# Patient Record
Sex: Male | Born: 1940 | Race: White | Hispanic: No
Health system: Midwestern US, Community
[De-identification: ages and names within clinical notes are randomized; demographics above are authoritative.]

## PROBLEM LIST (undated history)

## (undated) DIAGNOSIS — K811 Chronic cholecystitis: Secondary | ICD-10-CM

## (undated) DIAGNOSIS — E669 Obesity, unspecified: Secondary | ICD-10-CM

## (undated) DIAGNOSIS — R002 Palpitations: Secondary | ICD-10-CM

## (undated) DIAGNOSIS — I10 Essential (primary) hypertension: Secondary | ICD-10-CM

## (undated) DIAGNOSIS — R55 Syncope and collapse: Secondary | ICD-10-CM

## (undated) DIAGNOSIS — E785 Hyperlipidemia, unspecified: Secondary | ICD-10-CM

## (undated) DIAGNOSIS — H35371 Puckering of macula, right eye: Secondary | ICD-10-CM

## (undated) HISTORY — PX: CATARACT EXTRACTION: SUR2

## (undated) HISTORY — PX: TONSILLECTOMY: SUR1361

## (undated) HISTORY — DX: Palpitations: R00.2

## (undated) HISTORY — PX: LITHOTRIPSY: SUR834

## (undated) HISTORY — DX: Essential (primary) hypertension: I10

## (undated) HISTORY — DX: Hyperlipidemia, unspecified: E78.5

## (undated) HISTORY — DX: Syncope and collapse: R55

## (undated) HISTORY — DX: Obesity, unspecified: E66.9

---

## 2000-04-16 ENCOUNTER — Ambulatory Visit (HOSPITAL_COMMUNITY): Admission: RE | Admit: 2000-04-16 | Discharge: 2000-04-16 | Payer: Self-pay | Admitting: Urology

## 2000-04-16 ENCOUNTER — Encounter: Payer: Self-pay | Admitting: Urology

## 2001-05-13 ENCOUNTER — Encounter (INDEPENDENT_AMBULATORY_CARE_PROVIDER_SITE_OTHER): Payer: Self-pay | Admitting: Specialist

## 2001-05-13 ENCOUNTER — Ambulatory Visit (HOSPITAL_COMMUNITY): Admission: RE | Admit: 2001-05-13 | Discharge: 2001-05-13 | Payer: Self-pay | Admitting: Gastroenterology

## 2004-09-15 ENCOUNTER — Ambulatory Visit (HOSPITAL_COMMUNITY): Admission: RE | Admit: 2004-09-15 | Discharge: 2004-09-15 | Payer: Self-pay | Admitting: Gastroenterology

## 2008-09-29 ENCOUNTER — Encounter: Admission: RE | Admit: 2008-09-29 | Discharge: 2008-09-29 | Payer: Self-pay | Admitting: Family Medicine

## 2010-05-10 ENCOUNTER — Other Ambulatory Visit: Payer: Self-pay | Admitting: Dermatology

## 2010-07-06 ENCOUNTER — Other Ambulatory Visit: Payer: Self-pay | Admitting: Dermatology

## 2010-08-12 NOTE — Op Note (Signed)
Dylan Zimmerman, Dylan Zimmerman               ACCOUNT NO.:  [id number]   MEDICAL RECORD NO.:  [id number]          PATIENT TYPE:  AMB   LOCATION:  ENDO                         FACILITY:  [facility]   PHYSICIAN:  John C. Madilyn Fireman, M.D.    DATE OF BIRTH:  Aug 13, 1940   DATE OF PROCEDURE:  09/15/2004  DATE OF DISCHARGE:                                 OPERATIVE REPORT   PROCEDURE:  Colonoscopy.   INDICATIONS FOR PROCEDURE:  Family history of colon cancer in a first-degree  relative as well as a history of adenomatous colon polyps on colonoscopy  three years ago.   DESCRIPTION OF PROCEDURE:  The patient was placed in the left lateral  decubitus position then placed on the pulse monitor with continuous low-flow  oxygen delivered by nasal cannula. He was sedated with 50 mcg IV fentanyl  and 6 mg IV Versed. The Olympus video colonoscope was inserted into the  rectum and advanced to the cecum, confirmed by transillumination at  McBurney's point and visualization of the ileocecal valve and appendiceal  orifice. The prep was good. The cecum, ascending, transverse, descending and  sigmoid colon all appeared normal with no  masses, polyps, diverticula or  other mucosal abnormalities. The rectum likewise appeared normal and  retroflexed view of the anus revealed no obvious internal hemorrhoids. The  scope was then withdrawn and the patient returned to the recovery room in  stable condition. He tolerated the procedure well and there were no  immediate complications.   IMPRESSION:  Normal colonoscopy.   PLAN:  Repeat study in five years.       JCH/MEDQ  D:  09/15/2004  T:  09/15/2004  Job:  [id number]   cc:   Jethro Bastos, M.D.  [address]  [address]  [place]  [place] [address]  Fax: [phone]

## 2010-08-12 NOTE — Procedures (Signed)
[facility]  Patient:    Dylan Zimmerman Visit Number: [id number] MRN: [id number]          Service Type: END Location: ENDO Attending Physician:  Louie Bun Proc. Date: 05/13/01 Admit Date:  05/13/2001   CC:         Jethro Bastos, M.D.   Procedure Report  PROCEDURE PERFORMED:  Colonoscopy with polypectomy.  ENDOSCOPIST:  Everardo All. Madilyn Fireman, M.D.  INDICATION:  Family history of colon cancer in first degree relative.  Also impression of palpable rectal nodule on previous colonoscopy.  DESCRIPTION OF PROCEDURE:  The patient was placed in the left lateral decubitus position and placed on the pulse monitor with continuous low flow oxygen delivered by nasal cannula.  He was sedated with 70 mg IV Demerol and 6 mg IV Versed.  Once the video colonoscope was inserted into the rectum, it was advanced to the cecum, confirmed by transillumination of McBurneys point, and visualization of the ileocecal valve and appendiceal orifice.  The prep was excellent.  The cecum, ascending, transverse, descending, and sigmoid colon all appeared normal.  No masses, polyps, diverticula, or other mucosal abnormalities.  Within the rectum, there was a raised elevated area of mucosa extending from near the anal verge to approximately 3 cm proximal, and there was some erythema of the overlying mucosa, and a small area of what appeared to be purulence or exudate, although when pressed with the scope, no exudate could be extruded from the elevated area.  It was not clear, but this could have possibly represented an enlarged hemorrhoid or possibly even a small perianal abscess.  Immediately next to this raised area of mucosa, there was a small 5 mm polyp, which was fulgurated by hot biopsy.  The scope was then withdrawn.  External hemorrhoids were noted on withdrawal.  The patient was returned to the recovery room in stable condition.  He tolerated the procedure well and  there were no immediate complications.  IMPRESSION: 1. Small rectal polyp. 2. Small rectal nodule possibly representing small perianal    abscess or inflamed hemorrhoid.  PLAN:  Will review symptoms and consider trial Proctoform-HC and if she remains symptoms, refer to general surgeon for further evaluation.  Also, will await histology of the polyp for determination of need for future surveillance colonoscopy. Attending Physician:  Louie Bun DD:  05/13/01 TD:  05/13/01 Job: [id number]

## 2011-07-05 DIAGNOSIS — H2510 Age-related nuclear cataract, unspecified eye: Secondary | ICD-10-CM | POA: Diagnosis not present

## 2011-07-05 DIAGNOSIS — H40059 Ocular hypertension, unspecified eye: Secondary | ICD-10-CM | POA: Diagnosis not present

## 2011-07-05 DIAGNOSIS — H3581 Retinal edema: Secondary | ICD-10-CM | POA: Diagnosis not present

## 2011-07-05 DIAGNOSIS — H25019 Cortical age-related cataract, unspecified eye: Secondary | ICD-10-CM | POA: Diagnosis not present

## 2011-08-07 DIAGNOSIS — H2510 Age-related nuclear cataract, unspecified eye: Secondary | ICD-10-CM | POA: Diagnosis not present

## 2011-08-07 DIAGNOSIS — H35379 Puckering of macula, unspecified eye: Secondary | ICD-10-CM | POA: Diagnosis not present

## 2011-08-14 ENCOUNTER — Other Ambulatory Visit: Payer: Self-pay | Admitting: Dermatology

## 2011-08-14 DIAGNOSIS — L570 Actinic keratosis: Secondary | ICD-10-CM | POA: Diagnosis not present

## 2011-08-14 DIAGNOSIS — L821 Other seborrheic keratosis: Secondary | ICD-10-CM | POA: Diagnosis not present

## 2011-08-14 DIAGNOSIS — D485 Neoplasm of uncertain behavior of skin: Secondary | ICD-10-CM | POA: Diagnosis not present

## 2011-08-14 DIAGNOSIS — L578 Other skin changes due to chronic exposure to nonionizing radiation: Secondary | ICD-10-CM | POA: Diagnosis not present

## 2011-09-13 DIAGNOSIS — H2510 Age-related nuclear cataract, unspecified eye: Secondary | ICD-10-CM | POA: Diagnosis not present

## 2011-09-25 DIAGNOSIS — H269 Unspecified cataract: Secondary | ICD-10-CM | POA: Diagnosis not present

## 2011-09-25 DIAGNOSIS — H35379 Puckering of macula, unspecified eye: Secondary | ICD-10-CM | POA: Diagnosis not present

## 2011-09-25 DIAGNOSIS — H2510 Age-related nuclear cataract, unspecified eye: Secondary | ICD-10-CM | POA: Diagnosis not present

## 2012-01-09 DIAGNOSIS — Z23 Encounter for immunization: Secondary | ICD-10-CM | POA: Diagnosis not present

## 2012-01-15 ENCOUNTER — Other Ambulatory Visit: Payer: Self-pay | Admitting: Dermatology

## 2012-01-15 DIAGNOSIS — D1801 Hemangioma of skin and subcutaneous tissue: Secondary | ICD-10-CM | POA: Diagnosis not present

## 2012-01-15 DIAGNOSIS — L578 Other skin changes due to chronic exposure to nonionizing radiation: Secondary | ICD-10-CM | POA: Diagnosis not present

## 2012-01-15 DIAGNOSIS — L819 Disorder of pigmentation, unspecified: Secondary | ICD-10-CM | POA: Diagnosis not present

## 2012-01-15 DIAGNOSIS — D485 Neoplasm of uncertain behavior of skin: Secondary | ICD-10-CM | POA: Diagnosis not present

## 2012-01-15 DIAGNOSIS — L570 Actinic keratosis: Secondary | ICD-10-CM | POA: Diagnosis not present

## 2012-01-15 DIAGNOSIS — L821 Other seborrheic keratosis: Secondary | ICD-10-CM | POA: Diagnosis not present

## 2012-01-15 DIAGNOSIS — D239 Other benign neoplasm of skin, unspecified: Secondary | ICD-10-CM | POA: Diagnosis not present

## 2012-01-22 DIAGNOSIS — Z0000 Encounter for general adult medical examination without abnormal findings: Secondary | ICD-10-CM | POA: Diagnosis not present

## 2012-01-22 DIAGNOSIS — Z125 Encounter for screening for malignant neoplasm of prostate: Secondary | ICD-10-CM | POA: Diagnosis not present

## 2012-01-22 DIAGNOSIS — E785 Hyperlipidemia, unspecified: Secondary | ICD-10-CM | POA: Diagnosis not present

## 2012-01-22 DIAGNOSIS — I10 Essential (primary) hypertension: Secondary | ICD-10-CM | POA: Diagnosis not present

## 2012-01-22 DIAGNOSIS — G4700 Insomnia, unspecified: Secondary | ICD-10-CM | POA: Diagnosis not present

## 2012-01-22 DIAGNOSIS — Z1331 Encounter for screening for depression: Secondary | ICD-10-CM | POA: Diagnosis not present

## 2012-02-14 DIAGNOSIS — H04129 Dry eye syndrome of unspecified lacrimal gland: Secondary | ICD-10-CM | POA: Diagnosis not present

## 2012-02-14 DIAGNOSIS — H3581 Retinal edema: Secondary | ICD-10-CM | POA: Diagnosis not present

## 2012-02-14 DIAGNOSIS — H40059 Ocular hypertension, unspecified eye: Secondary | ICD-10-CM | POA: Diagnosis not present

## 2012-02-14 DIAGNOSIS — H02409 Unspecified ptosis of unspecified eyelid: Secondary | ICD-10-CM | POA: Diagnosis not present

## 2012-03-05 DIAGNOSIS — I10 Essential (primary) hypertension: Secondary | ICD-10-CM | POA: Diagnosis not present

## 2012-04-15 DIAGNOSIS — D3701 Neoplasm of uncertain behavior of lip: Secondary | ICD-10-CM | POA: Diagnosis not present

## 2012-04-15 DIAGNOSIS — D3709 Neoplasm of uncertain behavior of other specified sites of the oral cavity: Secondary | ICD-10-CM | POA: Diagnosis not present

## 2012-07-24 DIAGNOSIS — Z85828 Personal history of other malignant neoplasm of skin: Secondary | ICD-10-CM | POA: Diagnosis not present

## 2012-07-24 DIAGNOSIS — L819 Disorder of pigmentation, unspecified: Secondary | ICD-10-CM | POA: Diagnosis not present

## 2012-07-24 DIAGNOSIS — L820 Inflamed seborrheic keratosis: Secondary | ICD-10-CM | POA: Diagnosis not present

## 2012-07-24 DIAGNOSIS — D239 Other benign neoplasm of skin, unspecified: Secondary | ICD-10-CM | POA: Diagnosis not present

## 2012-07-24 DIAGNOSIS — D1801 Hemangioma of skin and subcutaneous tissue: Secondary | ICD-10-CM | POA: Diagnosis not present

## 2012-07-24 DIAGNOSIS — L821 Other seborrheic keratosis: Secondary | ICD-10-CM | POA: Diagnosis not present

## 2012-08-05 DIAGNOSIS — I10 Essential (primary) hypertension: Secondary | ICD-10-CM | POA: Diagnosis not present

## 2012-08-05 DIAGNOSIS — E785 Hyperlipidemia, unspecified: Secondary | ICD-10-CM | POA: Diagnosis not present

## 2012-08-14 DIAGNOSIS — H40059 Ocular hypertension, unspecified eye: Secondary | ICD-10-CM | POA: Diagnosis not present

## 2012-08-14 DIAGNOSIS — H04129 Dry eye syndrome of unspecified lacrimal gland: Secondary | ICD-10-CM | POA: Diagnosis not present

## 2012-08-14 DIAGNOSIS — H2510 Age-related nuclear cataract, unspecified eye: Secondary | ICD-10-CM | POA: Diagnosis not present

## 2012-08-14 DIAGNOSIS — Z961 Presence of intraocular lens: Secondary | ICD-10-CM | POA: Diagnosis not present

## 2012-12-23 DIAGNOSIS — Z23 Encounter for immunization: Secondary | ICD-10-CM | POA: Diagnosis not present

## 2013-01-22 ENCOUNTER — Other Ambulatory Visit: Payer: Self-pay | Admitting: Dermatology

## 2013-01-22 DIAGNOSIS — C44319 Basal cell carcinoma of skin of other parts of face: Secondary | ICD-10-CM | POA: Diagnosis not present

## 2013-01-22 DIAGNOSIS — L570 Actinic keratosis: Secondary | ICD-10-CM | POA: Diagnosis not present

## 2013-01-22 DIAGNOSIS — D239 Other benign neoplasm of skin, unspecified: Secondary | ICD-10-CM | POA: Diagnosis not present

## 2013-01-22 DIAGNOSIS — Z85828 Personal history of other malignant neoplasm of skin: Secondary | ICD-10-CM | POA: Diagnosis not present

## 2013-01-22 DIAGNOSIS — L820 Inflamed seborrheic keratosis: Secondary | ICD-10-CM | POA: Diagnosis not present

## 2013-01-22 DIAGNOSIS — L821 Other seborrheic keratosis: Secondary | ICD-10-CM | POA: Diagnosis not present

## 2013-02-05 DIAGNOSIS — G4700 Insomnia, unspecified: Secondary | ICD-10-CM | POA: Diagnosis not present

## 2013-02-05 DIAGNOSIS — E785 Hyperlipidemia, unspecified: Secondary | ICD-10-CM | POA: Diagnosis not present

## 2013-02-05 DIAGNOSIS — Z0000 Encounter for general adult medical examination without abnormal findings: Secondary | ICD-10-CM | POA: Diagnosis not present

## 2013-02-05 DIAGNOSIS — Z125 Encounter for screening for malignant neoplasm of prostate: Secondary | ICD-10-CM | POA: Diagnosis not present

## 2013-02-05 DIAGNOSIS — I10 Essential (primary) hypertension: Secondary | ICD-10-CM | POA: Diagnosis not present

## 2013-02-05 DIAGNOSIS — B351 Tinea unguium: Secondary | ICD-10-CM | POA: Diagnosis not present

## 2013-02-05 DIAGNOSIS — K649 Unspecified hemorrhoids: Secondary | ICD-10-CM | POA: Diagnosis not present

## 2013-02-12 DIAGNOSIS — H35379 Puckering of macula, unspecified eye: Secondary | ICD-10-CM | POA: Diagnosis not present

## 2013-02-12 DIAGNOSIS — H04129 Dry eye syndrome of unspecified lacrimal gland: Secondary | ICD-10-CM | POA: Diagnosis not present

## 2013-02-12 DIAGNOSIS — Z961 Presence of intraocular lens: Secondary | ICD-10-CM | POA: Diagnosis not present

## 2013-02-12 DIAGNOSIS — H02409 Unspecified ptosis of unspecified eyelid: Secondary | ICD-10-CM | POA: Diagnosis not present

## 2013-02-12 DIAGNOSIS — H2589 Other age-related cataract: Secondary | ICD-10-CM | POA: Diagnosis not present

## 2013-02-12 DIAGNOSIS — H40059 Ocular hypertension, unspecified eye: Secondary | ICD-10-CM | POA: Diagnosis not present

## 2013-02-26 DIAGNOSIS — B351 Tinea unguium: Secondary | ICD-10-CM | POA: Diagnosis not present

## 2013-04-09 DIAGNOSIS — I10 Essential (primary) hypertension: Secondary | ICD-10-CM | POA: Diagnosis not present

## 2013-04-09 DIAGNOSIS — B351 Tinea unguium: Secondary | ICD-10-CM | POA: Diagnosis not present

## 2013-04-09 DIAGNOSIS — K219 Gastro-esophageal reflux disease without esophagitis: Secondary | ICD-10-CM | POA: Diagnosis not present

## 2013-05-05 DIAGNOSIS — L608 Other nail disorders: Secondary | ICD-10-CM | POA: Diagnosis not present

## 2013-05-05 DIAGNOSIS — D2370 Other benign neoplasm of skin of unspecified lower limb, including hip: Secondary | ICD-10-CM | POA: Diagnosis not present

## 2013-05-26 DIAGNOSIS — D2370 Other benign neoplasm of skin of unspecified lower limb, including hip: Secondary | ICD-10-CM | POA: Diagnosis not present

## 2013-07-23 ENCOUNTER — Other Ambulatory Visit: Payer: Self-pay | Admitting: Dermatology

## 2013-07-23 DIAGNOSIS — C44319 Basal cell carcinoma of skin of other parts of face: Secondary | ICD-10-CM | POA: Diagnosis not present

## 2013-07-23 DIAGNOSIS — D2370 Other benign neoplasm of skin of unspecified lower limb, including hip: Secondary | ICD-10-CM | POA: Diagnosis not present

## 2013-07-23 DIAGNOSIS — D239 Other benign neoplasm of skin, unspecified: Secondary | ICD-10-CM | POA: Diagnosis not present

## 2013-07-23 DIAGNOSIS — Z85828 Personal history of other malignant neoplasm of skin: Secondary | ICD-10-CM | POA: Diagnosis not present

## 2013-07-23 DIAGNOSIS — L570 Actinic keratosis: Secondary | ICD-10-CM | POA: Diagnosis not present

## 2013-07-23 DIAGNOSIS — D1801 Hemangioma of skin and subcutaneous tissue: Secondary | ICD-10-CM | POA: Diagnosis not present

## 2013-07-23 DIAGNOSIS — L821 Other seborrheic keratosis: Secondary | ICD-10-CM | POA: Diagnosis not present

## 2013-08-13 DIAGNOSIS — Z961 Presence of intraocular lens: Secondary | ICD-10-CM | POA: Diagnosis not present

## 2013-08-13 DIAGNOSIS — H35379 Puckering of macula, unspecified eye: Secondary | ICD-10-CM | POA: Diagnosis not present

## 2013-08-13 DIAGNOSIS — H2589 Other age-related cataract: Secondary | ICD-10-CM | POA: Diagnosis not present

## 2013-08-13 DIAGNOSIS — H40059 Ocular hypertension, unspecified eye: Secondary | ICD-10-CM | POA: Diagnosis not present

## 2013-08-13 DIAGNOSIS — H04129 Dry eye syndrome of unspecified lacrimal gland: Secondary | ICD-10-CM | POA: Diagnosis not present

## 2013-09-17 DIAGNOSIS — Z85828 Personal history of other malignant neoplasm of skin: Secondary | ICD-10-CM | POA: Diagnosis not present

## 2013-09-17 DIAGNOSIS — C44319 Basal cell carcinoma of skin of other parts of face: Secondary | ICD-10-CM | POA: Diagnosis not present

## 2013-12-01 DIAGNOSIS — Z23 Encounter for immunization: Secondary | ICD-10-CM | POA: Diagnosis not present

## 2014-02-02 DIAGNOSIS — L821 Other seborrheic keratosis: Secondary | ICD-10-CM | POA: Diagnosis not present

## 2014-02-02 DIAGNOSIS — L905 Scar conditions and fibrosis of skin: Secondary | ICD-10-CM | POA: Diagnosis not present

## 2014-02-02 DIAGNOSIS — Z85828 Personal history of other malignant neoplasm of skin: Secondary | ICD-10-CM | POA: Diagnosis not present

## 2014-02-02 DIAGNOSIS — D2372 Other benign neoplasm of skin of left lower limb, including hip: Secondary | ICD-10-CM | POA: Diagnosis not present

## 2014-02-02 DIAGNOSIS — L570 Actinic keratosis: Secondary | ICD-10-CM | POA: Diagnosis not present

## 2014-02-02 DIAGNOSIS — D1801 Hemangioma of skin and subcutaneous tissue: Secondary | ICD-10-CM | POA: Diagnosis not present

## 2014-02-11 DIAGNOSIS — I10 Essential (primary) hypertension: Secondary | ICD-10-CM | POA: Diagnosis not present

## 2014-02-11 DIAGNOSIS — G4700 Insomnia, unspecified: Secondary | ICD-10-CM | POA: Diagnosis not present

## 2014-02-11 DIAGNOSIS — Z23 Encounter for immunization: Secondary | ICD-10-CM | POA: Diagnosis not present

## 2014-02-11 DIAGNOSIS — Z125 Encounter for screening for malignant neoplasm of prostate: Secondary | ICD-10-CM | POA: Diagnosis not present

## 2014-02-11 DIAGNOSIS — Z0000 Encounter for general adult medical examination without abnormal findings: Secondary | ICD-10-CM | POA: Diagnosis not present

## 2014-02-11 DIAGNOSIS — Z79899 Other long term (current) drug therapy: Secondary | ICD-10-CM | POA: Diagnosis not present

## 2014-02-11 DIAGNOSIS — E785 Hyperlipidemia, unspecified: Secondary | ICD-10-CM | POA: Diagnosis not present

## 2014-04-22 DIAGNOSIS — Z79899 Other long term (current) drug therapy: Secondary | ICD-10-CM | POA: Diagnosis not present

## 2014-04-22 DIAGNOSIS — I10 Essential (primary) hypertension: Secondary | ICD-10-CM | POA: Diagnosis not present

## 2014-08-18 DIAGNOSIS — L0390 Cellulitis, unspecified: Secondary | ICD-10-CM | POA: Diagnosis not present

## 2014-08-18 DIAGNOSIS — L259 Unspecified contact dermatitis, unspecified cause: Secondary | ICD-10-CM | POA: Diagnosis not present

## 2014-08-26 DIAGNOSIS — H2512 Age-related nuclear cataract, left eye: Secondary | ICD-10-CM | POA: Diagnosis not present

## 2014-08-26 DIAGNOSIS — H40053 Ocular hypertension, bilateral: Secondary | ICD-10-CM | POA: Diagnosis not present

## 2014-08-26 DIAGNOSIS — H35373 Puckering of macula, bilateral: Secondary | ICD-10-CM | POA: Diagnosis not present

## 2014-08-26 DIAGNOSIS — Z961 Presence of intraocular lens: Secondary | ICD-10-CM | POA: Diagnosis not present

## 2014-09-08 DIAGNOSIS — H35373 Puckering of macula, bilateral: Secondary | ICD-10-CM | POA: Diagnosis not present

## 2014-09-24 ENCOUNTER — Other Ambulatory Visit: Payer: Self-pay | Admitting: Gastroenterology

## 2014-09-24 DIAGNOSIS — Z800 Family history of malignant neoplasm of digestive organs: Secondary | ICD-10-CM | POA: Diagnosis not present

## 2014-09-24 DIAGNOSIS — Z09 Encounter for follow-up examination after completed treatment for conditions other than malignant neoplasm: Secondary | ICD-10-CM | POA: Diagnosis not present

## 2014-09-24 DIAGNOSIS — I499 Cardiac arrhythmia, unspecified: Secondary | ICD-10-CM | POA: Diagnosis not present

## 2014-09-24 DIAGNOSIS — D123 Benign neoplasm of transverse colon: Secondary | ICD-10-CM | POA: Diagnosis not present

## 2014-09-24 DIAGNOSIS — Z86010 Personal history of colonic polyps: Secondary | ICD-10-CM | POA: Diagnosis not present

## 2014-09-24 DIAGNOSIS — K5730 Diverticulosis of large intestine without perforation or abscess without bleeding: Secondary | ICD-10-CM | POA: Diagnosis not present

## 2014-09-24 DIAGNOSIS — D126 Benign neoplasm of colon, unspecified: Secondary | ICD-10-CM | POA: Diagnosis not present

## 2014-10-19 ENCOUNTER — Ambulatory Visit: Primary: Student in an Organized Health Care Education/Training Program

## 2014-10-29 DIAGNOSIS — E668 Other obesity: Secondary | ICD-10-CM | POA: Diagnosis not present

## 2014-10-29 DIAGNOSIS — I10 Essential (primary) hypertension: Secondary | ICD-10-CM | POA: Diagnosis not present

## 2014-10-29 DIAGNOSIS — R002 Palpitations: Secondary | ICD-10-CM | POA: Diagnosis not present

## 2014-10-29 DIAGNOSIS — E785 Hyperlipidemia, unspecified: Secondary | ICD-10-CM | POA: Diagnosis not present

## 2014-11-17 ENCOUNTER — Encounter: Primary: Student in an Organized Health Care Education/Training Program

## 2014-12-07 ENCOUNTER — Inpatient Hospital Stay: Admit: 2014-12-07 | Payer: MEDICARE | Primary: Student in an Organized Health Care Education/Training Program

## 2014-12-07 DIAGNOSIS — Z01812 Encounter for preprocedural laboratory examination: Secondary | ICD-10-CM | POA: Diagnosis not present

## 2014-12-07 LAB — POTASSIUM: Potassium: 4.1 mmol/L (ref 3.5–5.1)

## 2014-12-07 NOTE — Other (Addendum)
Patient verified name, DOB, and surgery as listed in [facility].    TYPE  CASE:1B  Orders per surgeon:  Received  Labs per surgeon:none:   Labs per anesthesia protocol: K+ : results pending  EKG  :  Not needed at time of PAT  Cardiac Clearance placed on chart.       Patient provided with handouts including guide to surgery , transfusions, pain management and hand hygiene for the family and community. Pt verbalizes understanding of all pre-op instructions .  Instructed that family must be present in building at all times.    Instructed patient to continue  previous medications as prescribed prior to surgery and  to take the following medications the day of surgery according to anesthesia guidelines : none       Original medication prescription bottles not visualized during patient appointment.     Continue all previous medications unless otherwise directed.      Instructed patient to hold  the following medications prior to surgery: none      Patient verbalized understanding of all instructions and provided all medical/health information to the best of their ability.

## 2014-12-07 NOTE — Other (Signed)
Recent Results (from the past 12 hour(s))   POTASSIUM    Collection Time: 12/07/14 11:45 AM   Result Value Ref Range    Potassium 4.1 3.5 - 5.1 mmol/L   Reviewed

## 2014-12-08 ENCOUNTER — Inpatient Hospital Stay: Payer: MEDICARE

## 2014-12-08 DIAGNOSIS — H35373 Puckering of macula, bilateral: Secondary | ICD-10-CM | POA: Diagnosis not present

## 2014-12-08 DIAGNOSIS — H259 Unspecified age-related cataract: Secondary | ICD-10-CM | POA: Diagnosis not present

## 2014-12-08 DIAGNOSIS — I10 Essential (primary) hypertension: Secondary | ICD-10-CM | POA: Diagnosis not present

## 2014-12-08 DIAGNOSIS — Z961 Presence of intraocular lens: Secondary | ICD-10-CM | POA: Diagnosis not present

## 2014-12-08 DIAGNOSIS — E785 Hyperlipidemia, unspecified: Secondary | ICD-10-CM | POA: Diagnosis not present

## 2014-12-08 DIAGNOSIS — Z79899 Other long term (current) drug therapy: Secondary | ICD-10-CM | POA: Diagnosis not present

## 2014-12-08 DIAGNOSIS — H35371 Puckering of macula, right eye: Secondary | ICD-10-CM | POA: Diagnosis not present

## 2014-12-08 MED ORDER — SODIUM CHLORIDE 0.9 % IJ SYRG
INTRAMUSCULAR | Status: DC | PRN
Start: 2014-12-08 — End: 2014-12-08

## 2014-12-08 MED ORDER — NEOMYCIN-POLYMYXIN-DEXAMETH 3.5 MG-10,000 UNIT/G-0.1 % EYE OINTMENT
3.5 mg/g-10,000 unit/g-0.1 % | OPHTHALMIC | Status: DC | PRN
Start: 2014-12-08 — End: 2014-12-08
  Administered 2014-12-08: 17:00:00 via OPHTHALMIC

## 2014-12-08 MED ORDER — PROPOFOL 10 MG/ML IV EMUL
10 mg/mL | INTRAVENOUS | Status: DC | PRN
Start: 2014-12-08 — End: 2014-12-08
  Administered 2014-12-08 (×3): via INTRAVENOUS

## 2014-12-08 MED ORDER — MIDAZOLAM 1 MG/ML IJ SOLN
1 mg/mL | INTRAMUSCULAR | Status: DC | PRN
Start: 2014-12-08 — End: 2014-12-08
  Administered 2014-12-08: 17:00:00 via INTRAVENOUS

## 2014-12-08 MED ORDER — NEOMYCIN-POLYMYXIN-DEXAMETH 3.5 MG-10,000 UNIT/G-0.1 % EYE OINTMENT
3.5 mg/g-10,000 unit/g-0.1 % | OPHTHALMIC | Status: AC
Start: 2014-12-08 — End: ?

## 2014-12-08 MED ORDER — SODIUM CHLORIDE 0.9 % INJECTION
INTRAMUSCULAR | Status: AC
Start: 2014-12-08 — End: ?

## 2014-12-08 MED ORDER — ONDANSETRON (PF) 4 MG/2 ML INJECTION
4 mg/2 mL | INTRAMUSCULAR | Status: DC | PRN
Start: 2014-12-08 — End: 2014-12-08
  Administered 2014-12-08: 17:00:00 via INTRAVENOUS

## 2014-12-08 MED ORDER — BALANCED SALT IRRIG SOLN COMB2 INTRAOCULAR
INTRAOCULAR | Status: DC | PRN
Start: 2014-12-08 — End: 2014-12-08
  Administered 2014-12-08: 17:00:00 via INTRAOCULAR

## 2014-12-08 MED ORDER — SODIUM CHLORIDE 0.9 % IJ SYRG
Freq: Three times a day (TID) | INTRAMUSCULAR | Status: DC
Start: 2014-12-08 — End: 2014-12-08

## 2014-12-08 MED ORDER — HYDROMORPHONE (PF) 2 MG/ML IJ SOLN
2 mg/mL | INTRAMUSCULAR | Status: DC | PRN
Start: 2014-12-08 — End: 2014-12-08

## 2014-12-08 MED ORDER — OXYCODONE 5 MG TAB
5 mg | Freq: Once | ORAL | Status: DC | PRN
Start: 2014-12-08 — End: 2014-12-08

## 2014-12-08 MED ORDER — ACETAMINOPHEN 500 MG TAB
500 mg | Freq: Once | ORAL | Status: DC
Start: 2014-12-08 — End: 2014-12-08

## 2014-12-08 MED ORDER — PROPOFOL 10 MG/ML IV EMUL
10 mg/mL | INTRAVENOUS | Status: AC
Start: 2014-12-08 — End: ?

## 2014-12-08 MED ORDER — LIDOCAINE HCL 1 % (10 MG/ML) IJ SOLN
10 mg/mL (1 %) | INTRAMUSCULAR | Status: DC | PRN
Start: 2014-12-08 — End: 2014-12-08

## 2014-12-08 MED ORDER — INDOCYANINE GREEN 25 MG INJECTION
25 mg | INTRAMUSCULAR | Status: DC | PRN
Start: 2014-12-08 — End: 2014-12-08
  Administered 2014-12-08: 17:00:00

## 2014-12-08 MED ORDER — DEXAMETHASONE SODIUM PHOSPHATE 4 MG/ML IJ SOLN
4 mg/mL | INTRAMUSCULAR | Status: DC | PRN
Start: 2014-12-08 — End: 2014-12-08
  Administered 2014-12-08: 17:00:00 via INTRAVENOUS

## 2014-12-08 MED ORDER — LACTATED RINGERS IV
INTRAVENOUS | Status: DC
Start: 2014-12-08 — End: 2014-12-08

## 2014-12-08 MED ORDER — FENTANYL CITRATE (PF) 50 MCG/ML IJ SOLN
50 mcg/mL | INTRAMUSCULAR | Status: AC
Start: 2014-12-08 — End: ?

## 2014-12-08 MED ORDER — HYALURONIDASE, HUMAN RECOMBINANT 150 UNIT/ML INJECTION SOLUTION
150 unit/mL | INTRAMUSCULAR | Status: DC | PRN
Start: 2014-12-08 — End: 2014-12-08
  Administered 2014-12-08: 17:00:00 via RETROBULBAR

## 2014-12-08 MED ORDER — LIDOCAINE HCL 2 % (20 MG/ML) IJ SOLN
20 mg/mL (2 %) | INTRAMUSCULAR | Status: DC | PRN
Start: 2014-12-08 — End: 2014-12-08
  Administered 2014-12-08: 17:00:00 via RETROBULBAR

## 2014-12-08 MED ORDER — LIDOCAINE HCL 2 % (20 MG/ML) IJ SOLN
20 mg/mL (2 %) | INTRAMUSCULAR | Status: AC
Start: 2014-12-08 — End: ?

## 2014-12-08 MED ORDER — BALANCED SALT IRRIG SOLN COMB2 INTRAOCULAR
INTRAOCULAR | Status: AC
Start: 2014-12-08 — End: ?

## 2014-12-08 MED ORDER — FENTANYL CITRATE (PF) 50 MCG/ML IJ SOLN
50 mcg/mL | INTRAMUSCULAR | Status: DC | PRN
Start: 2014-12-08 — End: 2014-12-08
  Administered 2014-12-08: 17:00:00 via INTRAVENOUS

## 2014-12-08 MED ORDER — MIDAZOLAM 1 MG/ML IJ SOLN
1 mg/mL | INTRAMUSCULAR | Status: AC
Start: 2014-12-08 — End: ?

## 2014-12-08 MED ORDER — PHENYLEPHRINE 2.5 % EYE DROPS
2.5 % | OPHTHALMIC | Status: DC | PRN
Start: 2014-12-08 — End: 2014-12-08
  Administered 2014-12-08: 16:00:00 via OPHTHALMIC

## 2014-12-08 MED ORDER — DIPHENHYDRAMINE HCL 50 MG/ML IJ SOLN
50 mg/mL | Freq: Once | INTRAMUSCULAR | Status: DC
Start: 2014-12-08 — End: 2014-12-08

## 2014-12-08 MED ORDER — LACTATED RINGERS IV
INTRAVENOUS | Status: DC
Start: 2014-12-08 — End: 2014-12-08
  Administered 2014-12-08: 16:00:00 via INTRAVENOUS

## 2014-12-08 MED ORDER — NALOXONE 0.4 MG/ML INJECTION
0.4 mg/mL | INTRAMUSCULAR | Status: DC | PRN
Start: 2014-12-08 — End: 2014-12-08

## 2014-12-08 MED ORDER — SODIUM CHLORIDE 0.9 % INJECTION
INTRAMUSCULAR | Status: DC | PRN
Start: 2014-12-08 — End: 2014-12-08
  Administered 2014-12-08: 17:00:00

## 2014-12-08 MED ORDER — ONDANSETRON (PF) 4 MG/2 ML INJECTION
4 mg/2 mL | Freq: Once | INTRAMUSCULAR | Status: DC
Start: 2014-12-08 — End: 2014-12-08

## 2014-12-08 MED ORDER — DEXAMETHASONE SODIUM PHOSPHATE 4 MG/ML IJ SOLN
4 mg/mL | INTRAMUSCULAR | Status: AC
Start: 2014-12-08 — End: ?

## 2014-12-08 MED ORDER — INDOCYANINE GREEN 25 MG INJECTION
25 mg | INTRAMUSCULAR | Status: AC
Start: 2014-12-08 — End: ?

## 2014-12-08 MED ORDER — CYCLOPENTOLATE 2 % EYE DROPS
2 % | OPHTHALMIC | Status: DC | PRN
Start: 2014-12-08 — End: 2014-12-08
  Administered 2014-12-08: 16:00:00 via OPHTHALMIC

## 2014-12-08 MED ORDER — BETAMETHASONE ACET & SOD PHOS 6 MG/ML SUSP FOR INJECTION
6 mg/mL | INTRAMUSCULAR | Status: DC | PRN
Start: 2014-12-08 — End: 2014-12-08
  Administered 2014-12-08: 17:00:00

## 2014-12-08 MED ORDER — ONDANSETRON (PF) 4 MG/2 ML INJECTION
4 mg/2 mL | INTRAMUSCULAR | Status: AC
Start: 2014-12-08 — End: ?

## 2014-12-08 MED ORDER — CEFAZOLIN 1 GRAM SOLUTION FOR INJECTION
1 gram | INTRAMUSCULAR | Status: AC
Start: 2014-12-08 — End: ?

## 2014-12-08 MED ORDER — MOXIFLOXACIN 0.5 % EYE DROPS
0.5 % | OPHTHALMIC | Status: DC | PRN
Start: 2014-12-08 — End: 2014-12-08
  Administered 2014-12-08: 16:00:00 via OPHTHALMIC

## 2014-12-08 MED ORDER — CEFAZOLIN 1 GRAM SOLUTION FOR INJECTION
1 gram | INTRAMUSCULAR | Status: DC | PRN
Start: 2014-12-08 — End: 2014-12-08
  Administered 2014-12-08: 17:00:00

## 2014-12-08 MED ORDER — ATROPINE 1 % EYE DROPS
1 % | OPHTHALMIC | Status: DC | PRN
Start: 2014-12-08 — End: 2014-12-08
  Administered 2014-12-08: 16:00:00 via OPHTHALMIC

## 2014-12-08 MED ORDER — BUPIVACAINE (PF) 0.5 % (5 MG/ML) IJ SOLN
0.5 % (5 mg/mL) | INTRAMUSCULAR | Status: DC | PRN
Start: 2014-12-08 — End: 2014-12-08
  Administered 2014-12-08: 17:00:00

## 2014-12-08 MED ORDER — HYALURONIDASE, HUMAN RECOMBINANT 150 UNIT/ML INJECTION SOLUTION
150 unit/mL | INTRAMUSCULAR | Status: AC
Start: 2014-12-08 — End: ?

## 2014-12-08 MED ORDER — FAMOTIDINE (PF) 20 MG/2 ML IV
20 mg/2 mL | Freq: Once | INTRAVENOUS | Status: AC
Start: 2014-12-08 — End: 2014-12-08
  Administered 2014-12-08: 16:00:00 via INTRAVENOUS

## 2014-12-08 MED ORDER — BUPIVACAINE (PF) 0.5 % (5 MG/ML) IJ SOLN
0.5 % (5 mg/mL) | INTRAMUSCULAR | Status: AC
Start: 2014-12-08 — End: ?

## 2014-12-08 MED ORDER — BETAMETHASONE ACET & SOD PHOS 6 MG/ML SUSP FOR INJECTION
6 mg/mL | INTRAMUSCULAR | Status: AC
Start: 2014-12-08 — End: ?

## 2014-12-08 MED FILL — NEOMYCIN-POLYMYXIN-DEXAMETH 3.5 MG-10,000 UNIT/G-0.1 % EYE OINTMENT: 3.5 mg/g-10,000 unit/g-0.1 % | OPHTHALMIC | Qty: 3.5

## 2014-12-08 MED FILL — BUPIVACAINE (PF) 0.5 % (5 MG/ML) IJ SOLN: 0.5 % (5 mg/mL) | INTRAMUSCULAR | Qty: 10

## 2014-12-08 MED FILL — PROPOFOL 10 MG/ML IV EMUL: 10 mg/mL | INTRAVENOUS | Qty: 50

## 2014-12-08 MED FILL — ONDANSETRON (PF) 4 MG/2 ML INJECTION: 4 mg/2 mL | INTRAMUSCULAR | Qty: 2

## 2014-12-08 MED FILL — HYLENEX 150 UNIT/ML INJECTION SOLUTION: 150 unit/mL | INTRAMUSCULAR | Qty: 1

## 2014-12-08 MED FILL — SODIUM CHLORIDE 0.9 % INJECTION: INTRAMUSCULAR | Qty: 10

## 2014-12-08 MED FILL — MIDAZOLAM 1 MG/ML IJ SOLN: 1 mg/mL | INTRAMUSCULAR | Qty: 2

## 2014-12-08 MED FILL — CYCLOGYL 2 % EYE DROPS: 2 % | OPHTHALMIC | Qty: 2

## 2014-12-08 MED FILL — ATROPINE 1 % EYE DROPS: 1 % | OPHTHALMIC | Qty: 2

## 2014-12-08 MED FILL — CELESTONE SOLUSPAN 6 MG/ML SUSPENSION FOR INJECTION: 6 mg/mL | INTRAMUSCULAR | Qty: 1

## 2014-12-08 MED FILL — FENTANYL CITRATE (PF) 50 MCG/ML IJ SOLN: 50 mcg/mL | INTRAMUSCULAR | Qty: 2

## 2014-12-08 MED FILL — BSS INTRAOCULAR SOLUTION: INTRAOCULAR | Qty: 15

## 2014-12-08 MED FILL — CEFAZOLIN 1 GRAM SOLUTION FOR INJECTION: 1 gram | INTRAMUSCULAR | Qty: 1000

## 2014-12-08 MED FILL — FAMOTIDINE (PF) 20 MG/2 ML IV: 20 mg/2 mL | INTRAVENOUS | Qty: 2

## 2014-12-08 MED FILL — DEXAMETHASONE SODIUM PHOSPHATE 4 MG/ML IJ SOLN: 4 mg/mL | INTRAMUSCULAR | Qty: 1

## 2014-12-08 MED FILL — PHENYLEPHRINE 2.5 % EYE DROPS: 2.5 % | OPHTHALMIC | Qty: 2

## 2014-12-08 MED FILL — ONDANSETRON (PF) 4 MG/2 ML INJECTION: 4 mg/2 mL | INTRAMUSCULAR | Qty: 4

## 2014-12-08 MED FILL — DEXAMETHASONE SODIUM PHOSPHATE 4 MG/ML IJ SOLN: 4 mg/mL | INTRAMUSCULAR | Qty: 4

## 2014-12-08 MED FILL — XYLOCAINE 20 MG/ML (2 %) INJECTION SOLUTION: 20 mg/mL (2 %) | INTRAMUSCULAR | Qty: 20

## 2014-12-08 MED FILL — IC GREEN 25 MG SOLUTION FOR INJECTION: 25 mg | INTRAMUSCULAR | Qty: 1

## 2014-12-08 MED FILL — PROPOFOL 10 MG/ML IV EMUL: 10 mg/mL | INTRAVENOUS | Qty: 20

## 2014-12-08 NOTE — Op Note (Signed)
[facility]   OPERATIVE REPORT       Name:  Tanner Payne, Tanner Payne   MR#:  [id number]   DOB:  07/05/1940   Account #:  [id number]   Date of Adm:  12/08/2014       DATE OF SURGERY: 12/08/2014     PREOPERATIVE DIAGNOSES   1. Epiretinal membrane, right eye.   2. Pseudophakia, right eye.    POSTOPERATIVE DIAGNOSIS:     OPERATIVE SURGEON: Nigel Mormon, MD    ANESTHESIA: Modified with a retrobulbar blockade.     PROCEDURE PERFORMED: A 25-gauge pars plana vitrectomy,   epiretinal membrane removal, indirect ophthalmoscope delivered   with a laser.    COMPLICATIONS: None.    DESCRIPTION OF PROCEDURE: [name] was seen in the preoperative   holding area and all questions about the procedure were   answered. The right eye was identified as the correct operative   site and informed consent was confirmed. The patient was rolled   in supine position in the operative suite. Appropriate cardiac   and pulmonary monitoring devices were applied per Anesthesia.   The patient was sedated with propofol and a retrobulbar blockade   consisting of 2% lidocaine and 0.25% Marcaine was placed behind   the right eye on a special needle without complication. The   right eye was prepped and draped in normal sterile fashion.   Sterile lid speculum was placed.    A standard 3-port 25-gauge vitrectomy unit was used after   placement of infusion cannula 3.5 mm posterior to the   infratemporal limbus. Correct position was visualized before   turning it on. Additional 2 ports were placed without   complication. Initial vitrectomy was performed and carried out   into the periphery with the aid of scleral depression. There was   a significant epiretinal membrane across the macula. This   fibrotic tissue extended over the optic nerve head. The 25-gauge   ILM forceps were used to grasp and peel the epiretinal membrane   across the central fovea, which was tightly adherent over the    central fovea and extending toward the optic nerve. ICG dye was   then used to stain the area between the temporal arcades. The   remaining ILM was visualized and removed across the central   fovea without complication. Indirect ophthalmoscope was then   used to place laser retinopexy for 360 degrees. There was a   small atrophic hole visualized superotemporally, with a small   amount of surrounding subretinal fluid. Laser retinopexy was   placed specifically around this hole, and good laser uptake was   visible. No additional retinal holes or tears were seen.   Sclerotomy ports were then removed in sequential fashion. There   was significant leakage from superior temporal port site.   Conjunctival dissection was performed and the sclerotomy site   oversewn. A precaution was taken by oversewing the other   2 sclerotomy sites. Subconjunctival injection of Ancef and   betamethasone was placed. Sterile lid speculum removed and light   pressure patch was placed. He was taken to Recovery in stable   condition, asked to follow up tomorrow for postop exam and   instructions.        Nigel Mormon, MD      PLG / LE   D:  12/08/2014   14:16   T:  12/08/2014   14:48   Job #:  [id number]

## 2014-12-08 NOTE — Anesthesia Pre-Procedure Evaluation (Signed)
Anesthetic History   No history of anesthetic complications            Review of Systems / Medical History  Patient summary reviewed and pertinent labs reviewed    Pulmonary  Within defined limits                 Neuro/Psych   Within defined limits           Cardiovascular    Hypertension: well controlled              Exercise tolerance: >4 METS  Comments: Mr. Czerniak had colonoscopy earlier this year with short arrhythmia during the case. He had full cardiac workup subsequently including holter monitor for 30 days that all returned negative and wnl   GI/Hepatic/Renal  Within defined limits              Endo/Other  Within defined limits           Other Findings              Physical Exam    Airway  Mallampati: II  TM Distance: 4 - 6 cm  Neck ROM: normal range of motion   Mouth opening: Normal     Cardiovascular    Rhythm: regular  Rate: normal         Dental  No notable dental hx       Pulmonary  Breath sounds clear to auscultation               Abdominal  GI exam deferred       Other Findings            Anesthetic Plan    ASA: 2  Anesthesia type: total IV anesthesia          Induction: Intravenous  Anesthetic plan and risks discussed with: Patient

## 2014-12-08 NOTE — Brief Op Note (Signed)
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

## 2014-12-08 NOTE — Anesthesia Post-Procedure Evaluation (Signed)
Post-Anesthesia Evaluation and Assessment    Patient: Tanner Payne MRN: [id number]  SSN: [id number]    Date of Birth: 1940-10-20  Age: 74 y.o.  Sex: male       Cardiovascular Function/Vital Signs  Visit Vitals   ??? BP 125/70   ??? Pulse (!) 53   ??? Temp 37.1 ??C (98.7 ??F)   ??? Resp 16   ??? Ht  (1.803 m)   ??? Wt 113.2 kg (249 lb 9 oz)   ??? SpO2 95%   ??? BMI 34.81 kg/m2       Patient is status post total IV anesthesia anesthesia for Procedure(s):  RIGHT VITRECTOMY POSTERIOR 25 GAUGE/LASER/SCAR TISSUE REMOVAL.    Nausea/Vomiting: None    Postoperative hydration reviewed and adequate.    Pain:  Pain Scale 1: Numeric (0 - 10) (12/08/14 1425)  Pain Intensity 1: 0 (12/08/14 1425)   Managed    Neurological Status:   Neuro (WDL): Within Defined Limits (12/08/14 1425)   At baseline    Mental Status and Level of Consciousness: Arousable    Pulmonary Status:   O2 Device: Room air (12/08/14 1425)   Adequate oxygenation and airway patent    Complications related to anesthesia: None    Post-anesthesia assessment completed. No concerns    Signed By: Barbaraann Faster, MD     December 08, 2014

## 2014-12-08 NOTE — Op Note (Signed)
[facility]   OPERATIVE REPORT       Name:  Tanner Payne, Tanner Payne   MR#:  [id number]   DOB:  1940-10-30   Account #:  [id number]   Date of Adm:  12/08/2014       DATE OF SURGERY: 12/08/2014     PREOPERATIVE DIAGNOSES   1. Epiretinal membrane, right eye.   2. Pseudophakia, right eye.    POSTOPERATIVE DIAGNOSIS:     OPERATIVE SURGEON: Nigel Mormon, MD    ANESTHESIA: Modified with a retrobulbar blockade.     PROCEDURE PERFORMED: A 25-gauge pars plana vitrectomy,   epiretinal membrane removal, indirect ophthalmoscope delivered   with a laser.    COMPLICATIONS: None.    DESCRIPTION OF PROCEDURE: Tanner Payne was seen in the preoperative   holding area and all questions about the procedure were   answered. The right eye was identified as the correct operative   site and informed consent was confirmed. The patient was rolled   in supine position in the operative suite. Appropriate cardiac   and pulmonary monitoring devices were applied per Anesthesia.   The patient was sedated with propofol and a retrobulbar blockade   consisting of 2% lidocaine and 0.25% Marcaine was placed behind   the right eye on a special needle without complication. The   right eye was prepped and draped in normal sterile fashion.   Sterile lid speculum was placed.    A standard 3-port 25-gauge vitrectomy unit was used after   placement of infusion cannula 3.5 mm posterior to the   infratemporal limbus. Correct position was visualized before   turning it on. Additional 2 ports were placed without   complication. Initial vitrectomy was performed and carried out   into the periphery with the aid of scleral depression. There was   a significant epiretinal membrane across the macula. This   fibrotic tissue extended over the optic nerve head. The 25-gauge   ILM forceps were used to grasp and peel the epiretinal membrane   across the central fovea, which was tightly adherent over the   central fovea and extending toward the optic nerve. ICG  dye was   then used to stain the area between the temporal arcades. The   remaining ILM was visualized and removed across the central   fovea without complication. Indirect ophthalmoscope was then   used to place laser retinopexy for 360 degrees. There was a   small atrophic hole visualized superotemporally, with a small   amount of surrounding subretinal fluid. Laser retinopexy was   placed specifically around this hole, and good laser uptake was   visible. No additional retinal holes or tears were seen.   Sclerotomy ports were then removed in sequential fashion. There   was significant leakage from superior temporal port site.   Conjunctival dissection was performed and the sclerotomy site   oversewn. A precaution was taken by oversewing the other   2 sclerotomy sites. Subconjunctival injection of Ancef and   betamethasone was placed. Sterile lid speculum removed and light   pressure patch was placed. He was taken to Recovery in stable   condition, asked to follow up tomorrow for postop exam and   instructions.        Nigel Mormon, MD      PLG / LE   D:  12/08/2014   14:16   T:  12/08/2014   14:48   Job #:  [id number]

## 2014-12-31 DIAGNOSIS — Z23 Encounter for immunization: Secondary | ICD-10-CM | POA: Diagnosis not present

## 2015-02-03 DIAGNOSIS — L814 Other melanin hyperpigmentation: Secondary | ICD-10-CM | POA: Diagnosis not present

## 2015-02-03 DIAGNOSIS — D1801 Hemangioma of skin and subcutaneous tissue: Secondary | ICD-10-CM | POA: Diagnosis not present

## 2015-02-03 DIAGNOSIS — Z85828 Personal history of other malignant neoplasm of skin: Secondary | ICD-10-CM | POA: Diagnosis not present

## 2015-02-03 DIAGNOSIS — L570 Actinic keratosis: Secondary | ICD-10-CM | POA: Diagnosis not present

## 2015-02-03 DIAGNOSIS — L720 Epidermal cyst: Secondary | ICD-10-CM | POA: Diagnosis not present

## 2015-02-03 DIAGNOSIS — L821 Other seborrheic keratosis: Secondary | ICD-10-CM | POA: Diagnosis not present

## 2015-02-03 DIAGNOSIS — D225 Melanocytic nevi of trunk: Secondary | ICD-10-CM | POA: Diagnosis not present

## 2015-02-04 DIAGNOSIS — H40053 Ocular hypertension, bilateral: Secondary | ICD-10-CM | POA: Diagnosis not present

## 2015-02-04 DIAGNOSIS — H25812 Combined forms of age-related cataract, left eye: Secondary | ICD-10-CM | POA: Diagnosis not present

## 2015-02-04 DIAGNOSIS — H35373 Puckering of macula, bilateral: Secondary | ICD-10-CM | POA: Diagnosis not present

## 2015-03-10 DIAGNOSIS — G4700 Insomnia, unspecified: Secondary | ICD-10-CM | POA: Diagnosis not present

## 2015-03-10 DIAGNOSIS — Z125 Encounter for screening for malignant neoplasm of prostate: Secondary | ICD-10-CM | POA: Diagnosis not present

## 2015-03-10 DIAGNOSIS — Z79899 Other long term (current) drug therapy: Secondary | ICD-10-CM | POA: Diagnosis not present

## 2015-03-10 DIAGNOSIS — Z0001 Encounter for general adult medical examination with abnormal findings: Secondary | ICD-10-CM | POA: Diagnosis not present

## 2015-03-10 DIAGNOSIS — E785 Hyperlipidemia, unspecified: Secondary | ICD-10-CM | POA: Diagnosis not present

## 2015-03-10 DIAGNOSIS — I10 Essential (primary) hypertension: Secondary | ICD-10-CM | POA: Diagnosis not present

## 2015-03-10 DIAGNOSIS — N529 Male erectile dysfunction, unspecified: Secondary | ICD-10-CM | POA: Diagnosis not present

## 2015-03-30 DIAGNOSIS — H35373 Puckering of macula, bilateral: Secondary | ICD-10-CM | POA: Diagnosis not present

## 2015-03-30 DIAGNOSIS — H259 Unspecified age-related cataract: Secondary | ICD-10-CM | POA: Diagnosis not present

## 2015-03-30 DIAGNOSIS — Z961 Presence of intraocular lens: Secondary | ICD-10-CM | POA: Diagnosis not present

## 2015-06-08 DIAGNOSIS — R7301 Impaired fasting glucose: Secondary | ICD-10-CM | POA: Diagnosis not present

## 2015-06-19 DIAGNOSIS — I952 Hypotension due to drugs: Secondary | ICD-10-CM | POA: Diagnosis not present

## 2015-06-19 DIAGNOSIS — J101 Influenza due to other identified influenza virus with other respiratory manifestations: Secondary | ICD-10-CM | POA: Diagnosis not present

## 2015-06-19 DIAGNOSIS — J180 Bronchopneumonia, unspecified organism: Secondary | ICD-10-CM | POA: Diagnosis not present

## 2015-07-02 DIAGNOSIS — R062 Wheezing: Secondary | ICD-10-CM | POA: Diagnosis not present

## 2015-07-02 DIAGNOSIS — R05 Cough: Secondary | ICD-10-CM | POA: Diagnosis not present

## 2015-09-01 DIAGNOSIS — H25812 Combined forms of age-related cataract, left eye: Secondary | ICD-10-CM | POA: Diagnosis not present

## 2015-09-01 DIAGNOSIS — H40053 Ocular hypertension, bilateral: Secondary | ICD-10-CM | POA: Diagnosis not present

## 2015-09-01 DIAGNOSIS — Z961 Presence of intraocular lens: Secondary | ICD-10-CM | POA: Diagnosis not present

## 2015-09-01 DIAGNOSIS — H35372 Puckering of macula, left eye: Secondary | ICD-10-CM | POA: Diagnosis not present

## 2016-01-07 DIAGNOSIS — N3000 Acute cystitis without hematuria: Secondary | ICD-10-CM | POA: Diagnosis not present

## 2016-02-09 DIAGNOSIS — Z85828 Personal history of other malignant neoplasm of skin: Secondary | ICD-10-CM | POA: Diagnosis not present

## 2016-02-09 DIAGNOSIS — L814 Other melanin hyperpigmentation: Secondary | ICD-10-CM | POA: Diagnosis not present

## 2016-02-09 DIAGNOSIS — L918 Other hypertrophic disorders of the skin: Secondary | ICD-10-CM | POA: Diagnosis not present

## 2016-02-09 DIAGNOSIS — L570 Actinic keratosis: Secondary | ICD-10-CM | POA: Diagnosis not present

## 2016-02-09 DIAGNOSIS — D485 Neoplasm of uncertain behavior of skin: Secondary | ICD-10-CM | POA: Diagnosis not present

## 2016-02-09 DIAGNOSIS — Z23 Encounter for immunization: Secondary | ICD-10-CM | POA: Diagnosis not present

## 2016-02-09 DIAGNOSIS — L821 Other seborrheic keratosis: Secondary | ICD-10-CM | POA: Diagnosis not present

## 2016-02-09 DIAGNOSIS — D1801 Hemangioma of skin and subcutaneous tissue: Secondary | ICD-10-CM | POA: Diagnosis not present

## 2016-02-09 DIAGNOSIS — L738 Other specified follicular disorders: Secondary | ICD-10-CM | POA: Diagnosis not present

## 2016-02-09 DIAGNOSIS — D225 Melanocytic nevi of trunk: Secondary | ICD-10-CM | POA: Diagnosis not present

## 2016-03-15 DIAGNOSIS — Z961 Presence of intraocular lens: Secondary | ICD-10-CM | POA: Diagnosis not present

## 2016-03-15 DIAGNOSIS — H40053 Ocular hypertension, bilateral: Secondary | ICD-10-CM | POA: Diagnosis not present

## 2016-03-15 DIAGNOSIS — H25812 Combined forms of age-related cataract, left eye: Secondary | ICD-10-CM | POA: Diagnosis not present

## 2016-03-15 DIAGNOSIS — H35372 Puckering of macula, left eye: Secondary | ICD-10-CM | POA: Diagnosis not present

## 2016-03-16 DIAGNOSIS — R062 Wheezing: Secondary | ICD-10-CM | POA: Diagnosis not present

## 2016-03-16 DIAGNOSIS — R05 Cough: Secondary | ICD-10-CM | POA: Diagnosis not present

## 2016-03-30 DIAGNOSIS — E7800 Pure hypercholesterolemia, unspecified: Secondary | ICD-10-CM | POA: Diagnosis not present

## 2016-03-30 DIAGNOSIS — G4700 Insomnia, unspecified: Secondary | ICD-10-CM | POA: Diagnosis not present

## 2016-03-30 DIAGNOSIS — Z0000 Encounter for general adult medical examination without abnormal findings: Secondary | ICD-10-CM | POA: Diagnosis not present

## 2016-03-30 DIAGNOSIS — R7301 Impaired fasting glucose: Secondary | ICD-10-CM | POA: Diagnosis not present

## 2016-03-30 DIAGNOSIS — I10 Essential (primary) hypertension: Secondary | ICD-10-CM | POA: Diagnosis not present

## 2016-03-30 DIAGNOSIS — Z125 Encounter for screening for malignant neoplasm of prostate: Secondary | ICD-10-CM | POA: Diagnosis not present

## 2016-03-30 DIAGNOSIS — Z79899 Other long term (current) drug therapy: Secondary | ICD-10-CM | POA: Diagnosis not present

## 2016-04-28 DIAGNOSIS — Z85828 Personal history of other malignant neoplasm of skin: Secondary | ICD-10-CM | POA: Diagnosis not present

## 2016-04-28 DIAGNOSIS — I8311 Varicose veins of right lower extremity with inflammation: Secondary | ICD-10-CM | POA: Diagnosis not present

## 2016-04-28 DIAGNOSIS — I872 Venous insufficiency (chronic) (peripheral): Secondary | ICD-10-CM | POA: Diagnosis not present

## 2016-04-28 DIAGNOSIS — I8312 Varicose veins of left lower extremity with inflammation: Secondary | ICD-10-CM | POA: Diagnosis not present

## 2016-07-05 DIAGNOSIS — J209 Acute bronchitis, unspecified: Secondary | ICD-10-CM | POA: Diagnosis not present

## 2016-07-19 DIAGNOSIS — H6503 Acute serous otitis media, bilateral: Secondary | ICD-10-CM | POA: Diagnosis not present

## 2016-09-13 DIAGNOSIS — H40053 Ocular hypertension, bilateral: Secondary | ICD-10-CM | POA: Diagnosis not present

## 2016-09-13 DIAGNOSIS — H35372 Puckering of macula, left eye: Secondary | ICD-10-CM | POA: Diagnosis not present

## 2016-09-13 DIAGNOSIS — H53001 Unspecified amblyopia, right eye: Secondary | ICD-10-CM | POA: Diagnosis not present

## 2016-09-13 DIAGNOSIS — H25812 Combined forms of age-related cataract, left eye: Secondary | ICD-10-CM | POA: Diagnosis not present

## 2016-09-13 DIAGNOSIS — Z961 Presence of intraocular lens: Secondary | ICD-10-CM | POA: Diagnosis not present

## 2017-01-01 DIAGNOSIS — R7301 Impaired fasting glucose: Secondary | ICD-10-CM | POA: Diagnosis not present

## 2017-01-01 DIAGNOSIS — Z23 Encounter for immunization: Secondary | ICD-10-CM | POA: Diagnosis not present

## 2017-01-01 DIAGNOSIS — M79643 Pain in unspecified hand: Secondary | ICD-10-CM | POA: Diagnosis not present

## 2017-01-01 DIAGNOSIS — R202 Paresthesia of skin: Secondary | ICD-10-CM | POA: Diagnosis not present

## 2017-02-13 DIAGNOSIS — Z85828 Personal history of other malignant neoplasm of skin: Secondary | ICD-10-CM | POA: Diagnosis not present

## 2017-02-13 DIAGNOSIS — D1801 Hemangioma of skin and subcutaneous tissue: Secondary | ICD-10-CM | POA: Diagnosis not present

## 2017-02-13 DIAGNOSIS — L820 Inflamed seborrheic keratosis: Secondary | ICD-10-CM | POA: Diagnosis not present

## 2017-02-13 DIAGNOSIS — L570 Actinic keratosis: Secondary | ICD-10-CM | POA: Diagnosis not present

## 2017-02-13 DIAGNOSIS — L821 Other seborrheic keratosis: Secondary | ICD-10-CM | POA: Diagnosis not present

## 2017-02-13 DIAGNOSIS — D485 Neoplasm of uncertain behavior of skin: Secondary | ICD-10-CM | POA: Diagnosis not present

## 2017-02-13 DIAGNOSIS — L929 Granulomatous disorder of the skin and subcutaneous tissue, unspecified: Secondary | ICD-10-CM | POA: Diagnosis not present

## 2017-02-13 DIAGNOSIS — L814 Other melanin hyperpigmentation: Secondary | ICD-10-CM | POA: Diagnosis not present

## 2017-02-13 DIAGNOSIS — L918 Other hypertrophic disorders of the skin: Secondary | ICD-10-CM | POA: Diagnosis not present

## 2017-03-14 DIAGNOSIS — H40053 Ocular hypertension, bilateral: Secondary | ICD-10-CM | POA: Diagnosis not present

## 2017-03-14 DIAGNOSIS — H35373 Puckering of macula, bilateral: Secondary | ICD-10-CM | POA: Diagnosis not present

## 2017-03-14 DIAGNOSIS — H53001 Unspecified amblyopia, right eye: Secondary | ICD-10-CM | POA: Diagnosis not present

## 2017-03-14 DIAGNOSIS — H25812 Combined forms of age-related cataract, left eye: Secondary | ICD-10-CM | POA: Diagnosis not present

## 2017-03-14 DIAGNOSIS — Z961 Presence of intraocular lens: Secondary | ICD-10-CM | POA: Diagnosis not present

## 2017-04-03 DIAGNOSIS — Z0001 Encounter for general adult medical examination with abnormal findings: Secondary | ICD-10-CM | POA: Diagnosis not present

## 2017-04-03 DIAGNOSIS — E7800 Pure hypercholesterolemia, unspecified: Secondary | ICD-10-CM | POA: Diagnosis not present

## 2017-04-03 DIAGNOSIS — Z79899 Other long term (current) drug therapy: Secondary | ICD-10-CM | POA: Diagnosis not present

## 2017-04-03 DIAGNOSIS — G4700 Insomnia, unspecified: Secondary | ICD-10-CM | POA: Diagnosis not present

## 2017-04-03 DIAGNOSIS — R7301 Impaired fasting glucose: Secondary | ICD-10-CM | POA: Diagnosis not present

## 2017-04-03 DIAGNOSIS — Z125 Encounter for screening for malignant neoplasm of prostate: Secondary | ICD-10-CM | POA: Diagnosis not present

## 2017-04-03 DIAGNOSIS — H25812 Combined forms of age-related cataract, left eye: Secondary | ICD-10-CM | POA: Diagnosis not present

## 2017-04-03 DIAGNOSIS — N529 Male erectile dysfunction, unspecified: Secondary | ICD-10-CM | POA: Diagnosis not present

## 2017-04-03 DIAGNOSIS — I10 Essential (primary) hypertension: Secondary | ICD-10-CM | POA: Diagnosis not present

## 2017-04-18 DIAGNOSIS — H2512 Age-related nuclear cataract, left eye: Secondary | ICD-10-CM | POA: Diagnosis not present

## 2017-04-23 DIAGNOSIS — H25812 Combined forms of age-related cataract, left eye: Secondary | ICD-10-CM | POA: Diagnosis not present

## 2017-04-23 DIAGNOSIS — H2512 Age-related nuclear cataract, left eye: Secondary | ICD-10-CM | POA: Diagnosis not present

## 2017-08-15 DIAGNOSIS — D225 Melanocytic nevi of trunk: Secondary | ICD-10-CM | POA: Diagnosis not present

## 2017-08-15 DIAGNOSIS — L720 Epidermal cyst: Secondary | ICD-10-CM | POA: Diagnosis not present

## 2017-08-15 DIAGNOSIS — L570 Actinic keratosis: Secondary | ICD-10-CM | POA: Diagnosis not present

## 2017-08-15 DIAGNOSIS — L821 Other seborrheic keratosis: Secondary | ICD-10-CM | POA: Diagnosis not present

## 2017-08-15 DIAGNOSIS — Z85828 Personal history of other malignant neoplasm of skin: Secondary | ICD-10-CM | POA: Diagnosis not present

## 2017-08-15 DIAGNOSIS — D485 Neoplasm of uncertain behavior of skin: Secondary | ICD-10-CM | POA: Diagnosis not present

## 2017-08-15 DIAGNOSIS — L814 Other melanin hyperpigmentation: Secondary | ICD-10-CM | POA: Diagnosis not present

## 2017-08-15 DIAGNOSIS — D1801 Hemangioma of skin and subcutaneous tissue: Secondary | ICD-10-CM | POA: Diagnosis not present

## 2017-10-03 DIAGNOSIS — H40053 Ocular hypertension, bilateral: Secondary | ICD-10-CM | POA: Diagnosis not present

## 2017-10-03 DIAGNOSIS — Z961 Presence of intraocular lens: Secondary | ICD-10-CM | POA: Diagnosis not present

## 2017-10-03 DIAGNOSIS — H53001 Unspecified amblyopia, right eye: Secondary | ICD-10-CM | POA: Diagnosis not present

## 2017-10-03 DIAGNOSIS — H35373 Puckering of macula, bilateral: Secondary | ICD-10-CM | POA: Diagnosis not present

## 2017-11-28 DIAGNOSIS — Z23 Encounter for immunization: Secondary | ICD-10-CM | POA: Diagnosis not present

## 2018-02-18 DIAGNOSIS — D485 Neoplasm of uncertain behavior of skin: Secondary | ICD-10-CM | POA: Diagnosis not present

## 2018-02-18 DIAGNOSIS — D1801 Hemangioma of skin and subcutaneous tissue: Secondary | ICD-10-CM | POA: Diagnosis not present

## 2018-02-18 DIAGNOSIS — L570 Actinic keratosis: Secondary | ICD-10-CM | POA: Diagnosis not present

## 2018-02-18 DIAGNOSIS — Z85828 Personal history of other malignant neoplasm of skin: Secondary | ICD-10-CM | POA: Diagnosis not present

## 2018-02-18 DIAGNOSIS — L821 Other seborrheic keratosis: Secondary | ICD-10-CM | POA: Diagnosis not present

## 2018-04-03 DIAGNOSIS — H26492 Other secondary cataract, left eye: Secondary | ICD-10-CM | POA: Diagnosis not present

## 2018-04-03 DIAGNOSIS — H40053 Ocular hypertension, bilateral: Secondary | ICD-10-CM | POA: Diagnosis not present

## 2018-04-03 DIAGNOSIS — H35373 Puckering of macula, bilateral: Secondary | ICD-10-CM | POA: Diagnosis not present

## 2018-04-03 DIAGNOSIS — H53001 Unspecified amblyopia, right eye: Secondary | ICD-10-CM | POA: Diagnosis not present

## 2018-04-10 DIAGNOSIS — Z79899 Other long term (current) drug therapy: Secondary | ICD-10-CM | POA: Diagnosis not present

## 2018-04-10 DIAGNOSIS — G4700 Insomnia, unspecified: Secondary | ICD-10-CM | POA: Diagnosis not present

## 2018-04-10 DIAGNOSIS — J329 Chronic sinusitis, unspecified: Secondary | ICD-10-CM | POA: Diagnosis not present

## 2018-04-10 DIAGNOSIS — Z0001 Encounter for general adult medical examination with abnormal findings: Secondary | ICD-10-CM | POA: Diagnosis not present

## 2018-04-10 DIAGNOSIS — E7800 Pure hypercholesterolemia, unspecified: Secondary | ICD-10-CM | POA: Diagnosis not present

## 2018-04-10 DIAGNOSIS — I10 Essential (primary) hypertension: Secondary | ICD-10-CM | POA: Diagnosis not present

## 2018-04-10 DIAGNOSIS — Z125 Encounter for screening for malignant neoplasm of prostate: Secondary | ICD-10-CM | POA: Diagnosis not present

## 2018-04-10 DIAGNOSIS — R7303 Prediabetes: Secondary | ICD-10-CM | POA: Diagnosis not present

## 2018-06-14 DIAGNOSIS — R55 Syncope and collapse: Secondary | ICD-10-CM | POA: Diagnosis not present

## 2018-06-14 DIAGNOSIS — M549 Dorsalgia, unspecified: Secondary | ICD-10-CM | POA: Diagnosis not present

## 2018-06-17 ENCOUNTER — Ambulatory Visit
Admission: RE | Admit: 2018-06-17 | Discharge: 2018-06-17 | Disposition: A | Discharge status: Home / Self Care | Payer: Medicare Other | Source: Ambulatory Visit | Attending: Family Medicine | Admitting: Family Medicine

## 2018-06-17 ENCOUNTER — Other Ambulatory Visit: Payer: Self-pay | Admitting: Family Medicine

## 2018-06-17 DIAGNOSIS — M549 Dorsalgia, unspecified: Secondary | ICD-10-CM

## 2018-06-17 DIAGNOSIS — M545 Low back pain: Secondary | ICD-10-CM | POA: Diagnosis not present

## 2018-06-17 DIAGNOSIS — M546 Pain in thoracic spine: Secondary | ICD-10-CM | POA: Diagnosis not present

## 2018-06-18 ENCOUNTER — Telehealth: Payer: Self-pay | Admitting: Cardiology

## 2018-06-18 DIAGNOSIS — R002 Palpitations: Secondary | ICD-10-CM

## 2018-06-18 DIAGNOSIS — I10 Essential (primary) hypertension: Secondary | ICD-10-CM

## 2018-06-18 DIAGNOSIS — E785 Hyperlipidemia, unspecified: Secondary | ICD-10-CM | POA: Insufficient documentation

## 2018-06-18 DIAGNOSIS — E669 Obesity, unspecified: Secondary | ICD-10-CM

## 2018-06-18 HISTORY — DX: Palpitations: R00.2

## 2018-06-18 HISTORY — DX: Essential (primary) hypertension: I10

## 2018-06-18 HISTORY — DX: Obesity, unspecified: E66.9

## 2018-06-18 NOTE — Telephone Encounter (Signed)
°  _____________   [id number] Pre-Screening Questions:   Do you currently have a fever? no (yes = cancel and refer to pcp for e-visit)  Have you recently travelled on a cruise, internationally, or to [place], [place], [place], [place], [place], or [place], [place] [employer]) ? no (yes = cancel, stay home, monitor symptoms, and contact pcp or initiate e-visit if symptoms develop)  Have you been in contact with someone that is currently pending confirmation of Covid19 testing or has been confirmed to have the Covid19 virus?  no (yes = cancel, stay home, away from tested individual, monitor symptoms, and contact pcp or initiate e-visit if symptoms develop)  Are you currently experiencing fatigue or cough? no (yes = pt should be prepared to have a mask placed at the time of their visit).        Patient answered all questions no, coming for in office visit 06/19/2018

## 2018-06-19 ENCOUNTER — Ambulatory Visit (INDEPENDENT_AMBULATORY_CARE_PROVIDER_SITE_OTHER): Payer: Medicare Other

## 2018-06-19 ENCOUNTER — Encounter: Payer: Self-pay | Admitting: Cardiology

## 2018-06-19 ENCOUNTER — Encounter: Payer: Self-pay | Admitting: *Deleted

## 2018-06-19 ENCOUNTER — Ambulatory Visit (INDEPENDENT_AMBULATORY_CARE_PROVIDER_SITE_OTHER): Payer: Medicare Other | Admitting: Cardiology

## 2018-06-19 ENCOUNTER — Other Ambulatory Visit: Payer: Self-pay

## 2018-06-19 VITALS — BP 136/76 | HR 80 | Ht 71.0 in | Wt 255.1 lb

## 2018-06-19 DIAGNOSIS — I10 Essential (primary) hypertension: Secondary | ICD-10-CM | POA: Diagnosis not present

## 2018-06-19 DIAGNOSIS — I493 Ventricular premature depolarization: Secondary | ICD-10-CM

## 2018-06-19 DIAGNOSIS — R55 Syncope and collapse: Secondary | ICD-10-CM

## 2018-06-19 DIAGNOSIS — I447 Left bundle-branch block, unspecified: Secondary | ICD-10-CM | POA: Diagnosis not present

## 2018-06-19 NOTE — Progress Notes (Signed)
Cardiology Office Note:    Date:  06/19/2018   ID:  Dylan Zimmerman, DOB 01/02/1941, MRN [id number]  PCP:  Lujean Amel, MD  Cardiologist:  Shirlee More, MD   Referring MD: Lujean Amel, MD  ASSESSMENT:    1. Syncope, unspecified syncope type   2. Left bundle branch block   3. PVC (premature ventricular contraction)   4. Essential hypertension    PLAN:    In order of problems listed above:  1. I suspect he had a common faint caused by an intersection of his antihypertensive medications prolonged immobilization rapid shifting posture bending over and a vagal response to pain with leg cramps.  The episode was quite brief no indication of ictal activity and no recurrence.  His EKG is showing progression from left anterior hemiblock to left bundle branch block for further evaluation electively after the coronavirus outbreak like to see him have a cardiac CTA to screen for CAD and echocardiogram and I will ask him to return to my office in 3 months.  In view of his history of previous frequent PVCs and brief run of PVCs during colonoscopy I asked him where a 14-day full disclosure ZIO monitor.  If reassuring will delay further evaluation for 3 months 2. New on EKG plan echocardiogram and cardiac CTA electively 3. He has a history during colonoscopy to have a 9 beat run of PVCs as an isolated event likely due to anesthesia but screen with a 14-day monitor for complex arrhythmia 4. Stable he has no orthostatic shift in blood pressure I asked him to be careful changing posture quickly after prolonged immobilization and to start to wear compression hose medium knee-high to blunt any orthostatic drop in blood pressure in the future.  Next appointment   Medication Adjustments/Labs and Tests Ordered: Current medicines are reviewed at length with the patient today.  Concerns regarding medicines are outlined above.  No orders of the defined types were placed in this encounter.  No orders of the  defined types were placed in this encounter.    Chief Complaint  Patient presents with  . New Patient (Initial Visit)  . Loss of Consciousness    History of Present Illness:    Dylan Zimmerman is a 78 y.o. male who is being seen today for the evaluation of syncope at the request of Koirala, Dibas, MD.  He had previously been seen by Dr. Wynonia Zimmerman for frequent PVCs and  left anterior hemiblock. Last Friday after driving for an hour and a half he got out of his car felt lightheaded had intense cramping in his calves bent over to massage them lost consciousness and struck the ground his wife was in the other side of the car perhaps 10 seconds elapsed when she got to him he was alert he had no ictal activity or incontinence.  He does have episodes where he is lightheaded when he stands and also has positional vertigo.  He was seen by Dr. Wynonia Zimmerman 2016 when he had a 9 beat run of PVCs associated with endoscopic procedure or 30-day event monitor without any arrhythmia.  His EKG showed left anterior hemiblock.  He has no history of chest pain shortness of breath congenital rheumatic heart disease at times he has palpitation brief momentary never severe sustained and unassociated with this event he has no history of a seizure disorder he was seen by his PCP mostly due to concern he had a had a concussion was found to have left bundle branch block and  referred to my office.  His son is a physician primary care family medicine pediatrics in [place].  He cautioned his father that he would have a paucity of cardiac testing at this time with the coronavirus outbreak.  Past Medical History:  Diagnosis Date  . Hyperlipidemia   . Hypertension   . Syncope and collapse     Past Surgical History:  Procedure Laterality Date  . CATARACT EXTRACTION Bilateral   . LITHOTRIPSY    . TONSILLECTOMY      Current Medications: Current Meds  Medication Sig  . Multiple Vitamin (MULTIVITAMIN) tablet Take 1  tablet by mouth daily.     Allergies:   Shellfish allergy; Bactrim [sulfamethoxazole-trimethoprim]; and Iodine   Social History   Socioeconomic History  . Marital status: Single    Spouse name: Not on file  . Number of children: Not on file  . Years of education: Not on file  . Highest education level: Not on file  Occupational History  . Not on file  Social Needs  . Financial resource strain: Not on file  . Food insecurity:    Worry: Not on file    Inability: Not on file  . Transportation needs:    Medical: Not on file    Non-medical: Not on file  Tobacco Use  . Smoking status: Never Smoker  . Smokeless tobacco: Never Used  Substance and Sexual Activity  . Alcohol use: Never    Frequency: Never  . Drug use: Not Currently  . Sexual activity: Not on file  Lifestyle  . Physical activity:    Days per week: Not on file    Minutes per session: Not on file  . Stress: Not on file  Relationships  . Social connections:    Talks on phone: Not on file    Gets together: Not on file    Attends religious service: Not on file    Active member of club or organization: Not on file    Attends meetings of clubs or organizations: Not on file    Relationship status: Not on file  Other Topics Concern  . Not on file  Social History Narrative  . Not on file     Family History: The patient's family history includes Cancer in his father and mother; Heart attack in his maternal grandfather and paternal grandmother; Heart disease in his paternal grandfather and paternal grandmother.  ROS:   Review of Systems  Constitution: Positive for malaise/fatigue.  HENT: Negative.   Eyes: Negative.   Cardiovascular: Positive for palpitations (infrequent brief momentary) and syncope.  Respiratory: Negative.   Endocrine: Negative.   Hematologic/Lymphatic: Negative.   Skin: Negative.   Musculoskeletal: Positive for muscle cramps.  Gastrointestinal: Negative.   Genitourinary: Negative.    Neurological: Positive for dizziness and vertigo.  Psychiatric/Behavioral: Negative.   Allergic/Immunologic: Negative.    Please see the history of present illness.     All other systems reviewed and are negative.  EKGs/Labs/Other Studies Reviewed:    The following studies were reviewed today: Reviewed records of Dr. Wynonia Zimmerman when he is seen in 2016 including a 30-day ambulatory event monitor with no arrhythmia.  His EKG at that time showed left anterior hemiblock.  EKG:  EKG performed at his PCP office independently reviewed by me sinus rhythm normal PR interval left bundle branch block  Recent Labs: No results found for requested labs within last 8760 hours.  Recent Lipid Panel No results found for: CHOL, TRIG, HDL, CHOLHDL, VLDL,  LDLCALC, LDLDIRECT  Physical Exam:    VS:  BP 136/76 (BP Location: Left Arm, Patient Position: Sitting, Cuff Size: Normal)   Pulse 80   Ht 5\' 11"  (1.803 m)   Wt 255 lb 1.3 oz (115.7 kg)   SpO2 97%   BMI 35.58 kg/m     Wt Readings from Last 3 Encounters:  06/19/18 255 lb 1.3 oz (115.7 kg)     GEN:  Well nourished, well developed in no acute distress HEENT: Normal NECK: No JVD; No carotid bruits LYMPHATICS: No lymphadenopathy CARDIAC: RRR, no murmurs, rubs, gallops RESPIRATORY:  Clear to auscultation without rales, wheezing or rhonchi  ABDOMEN: Soft, non-tender, non-distended MUSCULOSKELETAL:  No edema; No deformity  SKIN: Warm and dry NEUROLOGIC:  Alert and oriented x 3 PSYCHIATRIC:  Normal affect     Signed, Shirlee More, MD  06/19/2018 11:39 AM    [place]

## 2018-06-19 NOTE — Patient Instructions (Addendum)
Medication Instructions:  Your physician recommends that you continue on your current medications as directed. Please refer to the Current Medication list given to you today.  If you need a refill on your cardiac medications before your next appointment, please call your pharmacy.   Lab work: NONE If you have labs (blood work) drawn today and your tests are completely normal, you will receive your results only by: Marland Kitchen MyChart Message (if you have MyChart) OR . A paper copy in the mail If you have any lab test that is abnormal or we need to change your treatment, we will call you to review the results.  Testing/Procedures: Your physician has recommended that you wear a 14 day ZIO Patach.  A ZIO patch is a medical device that record the heart's electrical activity. Doctors most often use these monitors to diagnose arrhythmias. Arrhythmias are problems with the speed or rhythm of the heartbeat. The monitor is a small, portable device. You can wear one while you do your normal daily activities. This is usually used to diagnose what is causing palpitations/syncope (passing out).    Follow-Up: At [facility], you and your health needs are our priority.  As part of our continuing mission to provide you with exceptional heart care, we have created designated Provider Care Teams.  These Care Teams include your primary Cardiologist (physician) and Advanced Practice Providers (APPs -  Physician Assistants and Nurse Practitioners) who all work together to provide you with the care you need, when you need it. You will need a follow up appointment in 3 months.   1. Avoid all over-the-counter antihistamines except Claritin/Loratadine and Zyrtec/Cetrizine. 2. Avoid all combination including cold sinus allergies flu decongestant and sleep medications 3. You can use Robitussin DM Mucinex and Mucinex DM for cough. 4. can use Tylenol aspirin ibuprofen and naproxen but no combinations such as sleep or  sinus.   Locations to Purchase Compression Stockings:  [facility] [address] [place], [place] [address] [phone] (fax) [email or web address] *Mon-Fri 9am-4:30pm* *Closed on Holidays*  Putting on Compression Stockings Turn the stocking inside-out, then fit it over your toes and heel.  Roll the stocking up your leg.  Once stockings are on, make sure the top of the stocking is about two fingers' width below the crease of the knee (or the groin if you wear thigh-high stockings).  Use equipment, such as a stocking donner, or wear rubber gloves to make it easier to put on compression stockings.   Elastic compression stockings are prescribed to treat many vein problems. Wearing them may be the most important thing you do to manage your symptoms. The stockings fit tightly aroundyour ankle, gradually reducing in pressure as they go up your legs. This helps keep blood flowing toyour heart. As a result, swelling is reduced. Your doctor will prescribe stockings at a safe pressure for you. He or she will also tell you how often to wear and remove the stockings. Follow these instructions closely.Also, do not buy or wear compression stockings without first seeing your doctor. Tips for Wear and Care To wear stockings safely and to get the most benefit:  Wear the length prescribed by your doctor.  Pull them to the designated height and no farther. Don't let them bunch at the top. This can restrict blood flow and increase swelling.  Wear the stockingsfor the amount of time your doctor recommends. Replace them when they start to feel loose. This will likely be every 3 to 6  months.  Remove them as your doctor directs. When removed, wash your legs. Then check your legs and feet for sores. Call your doctor if you find a sore. Don't put the stockings back on unless your doctor directs.  Wash the stockings as instructed. They may need to be  handwashed.

## 2018-07-08 DIAGNOSIS — R55 Syncope and collapse: Secondary | ICD-10-CM | POA: Diagnosis not present

## 2018-08-20 ENCOUNTER — Telehealth: Payer: Self-pay | Admitting: Cardiology

## 2018-08-20 NOTE — Telephone Encounter (Signed)
Patient reports near syncopal episode while laying in bed this morning. He states a wave swept over him when he was laying down, checked VS 110/45, HR 48 and recheck 5 minutes later was 147/70, HR 58.   He has been running 130/65-70.    Also reports having fallen after standing up from sitting several days ago. He was not wearing his compression stockings at that time. Otherwise he has been wearing them.     He stopped taking sildenafil and his PCP cut his lisinopril/HCTZ in half, now taking 10-12.5mg  daily.      Recently wore Zio-Patch for 14 days.     Scheduled for follow-up 09/22/08. Patient feels like he should be seen sooner than that.   pls advise, tx

## 2018-08-20 NOTE — Telephone Encounter (Signed)
Patient state that he has an episode of passing out this am and low heartrate and felt awful.. he checked and pulse 48.. please call patient.

## 2018-08-20 NOTE — Telephone Encounter (Signed)
I agree with scheduled in the office tomorrow or Wednesday and may need to consider an implanted loop recorder as his previous CO monitor was unremarkable

## 2018-08-21 ENCOUNTER — Encounter: Payer: Self-pay | Admitting: Cardiology

## 2018-08-21 ENCOUNTER — Ambulatory Visit (INDEPENDENT_AMBULATORY_CARE_PROVIDER_SITE_OTHER): Payer: Medicare Other | Admitting: Cardiology

## 2018-08-21 ENCOUNTER — Encounter: Payer: Self-pay | Admitting: *Deleted

## 2018-08-21 ENCOUNTER — Other Ambulatory Visit: Payer: Self-pay

## 2018-08-21 VITALS — BP 144/74 | HR 62 | Temp 98.2°F | Ht 71.0 in | Wt 252.0 lb

## 2018-08-21 DIAGNOSIS — R55 Syncope and collapse: Secondary | ICD-10-CM | POA: Diagnosis not present

## 2018-08-21 DIAGNOSIS — I493 Ventricular premature depolarization: Secondary | ICD-10-CM | POA: Insufficient documentation

## 2018-08-21 DIAGNOSIS — I10 Essential (primary) hypertension: Secondary | ICD-10-CM

## 2018-08-21 DIAGNOSIS — R002 Palpitations: Secondary | ICD-10-CM

## 2018-08-21 HISTORY — DX: Ventricular premature depolarization: I49.3

## 2018-08-21 NOTE — Progress Notes (Signed)
Office  Date:  08/21/2018   ID:  Dylan Zimmerman, DOB Feb 24, 1941, MRN [id number]  Patient Location: Home Provider Location: Office  PCP:  Lujean Amel, MD  Cardiologist:  Shirlee More, MD  Electrophysiologist:  None   Evaluation Performed:  Follow-Up Visit  Chief Complaint:  Syncope  History of Present Illness:    Dylan Zimmerman is a 78 y.o. male with syncope felt to be typical vasovagal and past history of frequent PVCs and left anterior hemiblock.  And 2016 he saw Dr. Wynonia Lawman and had 9 beat run of PVCs associated endoscopic procedure and subsequent 30-day event monitor showed no arrhythmia.  He has no known history of congenital or rheumatic heart disease.  He fell in my office and request to be seen with another syncopal event.  Last seen by me 06/19/2018.  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  He also had 4 brief brief runs of atrial premature contractions the longest 15 complexes in duration.  He has had 2 episodes of concern the first occurred after working outdoors in hot humid weather he stood up had the sensation he would faint could not get himself to the ground fell and struck his head.  The second episode occurred when he was in bed witnessed by his wife she said he has market pallor he felt as if he would lose consciousness did not and afterwards she checked his heart rate and blood pressure and his heart rate was 48 bpm.  His physician had decreased the dose of his lisinopril after the first episode and since that has been discontinued.  For further evaluation on referral to EP for an implanted loop recorder and do a quick evaluation for cardiomyopathy and CAD with echocardiogram and myocardial perfusion study.  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  He will remain off his  antihypertensive medications and monitor blood pressure at home.  Outside of these episodes he is very vigorous active and has no exercise intolerance dyspnea chest pain or palpitation.  The patient does not have symptoms concerning for COVID-19 infection (fever, chills, cough, or new shortness of breath).    Past Medical History:  Diagnosis Date  . Essential hypertension 06/18/2018  . Hyperlipidemia   . Hypertension   . Obesity 06/18/2018  . Palpitations 06/18/2018  . Syncope and collapse    Past Surgical History:  Procedure Laterality Date  . CATARACT EXTRACTION Bilateral   . LITHOTRIPSY    . TONSILLECTOMY       Current Meds  Medication Sig  . atorvastatin (LIPITOR) 10 MG tablet Take 10 mg by mouth daily.  Marland Kitchen lisinopril-hydrochlorothiazide (PRINZIDE,ZESTORETIC) 20-25 MG tablet Take 0.5 tablets by mouth daily.   . Multiple Vitamin (MULTIVITAMIN) tablet Take 1 tablet by mouth daily.  . sildenafil (REVATIO) 20 MG tablet Take 1 tablet by mouth as needed.  . zolpidem (AMBIEN) 10 MG tablet Take 1 tablet by mouth at bedtime as needed.     Allergies:   Shellfish allergy; Bactrim [sulfamethoxazole-trimethoprim]; and Iodine   Social History   Tobacco Use  . Smoking status: Never Smoker  . Smokeless tobacco: Never Used  Substance Use Topics  . Alcohol use: Never    Frequency: Never  . Drug use: Not Currently     Family Hx: The patient's family history includes Cancer in  his father and mother; Heart attack in his maternal grandfather and paternal grandmother; Heart disease in his paternal grandfather and paternal grandmother.  ROS:   Please see the history of present illness.     All other systems reviewed and are negative.   Prior CV studies:   The following studies were reviewed today:    Labs/Other Tests and Data Reviewed:    EKG:  An ECG dated today was personally reviewed today and demonstrated:  [place] LBBB   Recent Labs: No results found for requested labs within  last 8760 hours.   Recent Lipid Panel No results found for: CHOL, TRIG, HDL, CHOLHDL, LDLCALC, LDLDIRECT  Wt Readings from Last 3 Encounters:  08/21/18 252 lb (114.3 kg)  06/19/18 255 lb 1.3 oz (115.7 kg)     Objective:    Vital Signs:  BP (!) 144/74 (BP Location: Right Arm, Patient Position: Sitting, Cuff Size: Large)   Pulse 62   Temp 98.2 F (36.8 C)   Ht 5\' 11"  (1.803 m)   Wt 252 lb (114.3 kg)   SpO2 97%   BMI 35.15 kg/m    VITAL SIGNS:  reviewed GEN:  no acute distress EYES:  sclerae anicteric, EOMI - Extraocular Movements Intact RESPIRATORY:  normal respiratory effort, symmetric expansion CARDIOVASCULAR:  no peripheral edema SKIN:  no rash, lesions or ulcers. MUSCULOSKELETAL:  no obvious deformities. NEURO:  alert and oriented x 3, no obvious focal deficit PSYCH:  normal affect  He has no pallor of her skin or membranes, chest clear to auscultation cardiac exam is normal heart sounds no gallop murmur rub     1. Recurrent syncope very concerning for primary cardiac especially bradycardia in the quickly referred from implanted loop recorder.  He is taking no rate slowing medications will remain off his antihypertensives. 2. PVCs noted previously he has no evidence of sustained VT but ongoing undergoing evaluation regarding cardiomyopathy and CAD.  If etiology remains a doubt there may be a role for EPS 3. Hypertension stable remain off lisinopril  COVID-19 Education: The signs and symptoms of COVID-19 were discussed with the patient and how to seek care for testing (follow up with PCP or arrange E-visit).  The importance of social distancing was discussed today.  Time:   Today, I have spent 26 minutes with the patient with telehealth technology discussing the above problems.     Medication Adjustments/Labs and Tests Ordered: Current medicines are reviewed at length with the patient today.  Concerns regarding medicines are outlined above.   Tests Ordered: No  orders of the defined types were placed in this encounter.   Medication Changes: No orders of the defined types were placed in this encounter.   Disposition:  Follow up in 6 week(s)  Signed, Shirlee More, MD  08/21/2018 1:46 PM     [facility]

## 2018-08-21 NOTE — Patient Instructions (Addendum)
Medication Instructions:  Your physician has recommended you make the following change in your medication:   STOP lisinopril-hydrochlorothiazide   **Check your BP at the same time every day and call our office if your systolic BP (top number) is greater than 150.   If you need a refill on your cardiac medications before your next appointment, please call your pharmacy.   Lab work: None  If you have labs (blood work) drawn today and your tests are completely normal, you will receive your results only by: Marland Kitchen MyChart Message (if you have MyChart) OR . A paper copy in the mail If you have any lab test that is abnormal or we need to change your treatment, we will call you to review the results.  Testing/Procedures: You had an EKG today.   Your physician has requested that you have an echocardiogram. Echocardiography is a painless test that uses sound waves to create images of your heart. It provides your doctor with information about the size and shape of your heart and how well your heart's chambers and valves are working. This procedure takes approximately one hour. There are no restrictions for this procedure.  Your physician has requested that you have a lexiscan myoview. For further information please visit [email or web address]. Please follow instruction sheet, as given.  You have been referred to see an electrophysiology, Dr. Curt Bears. You will be contacted to schedule an appointment to discuss loop recorder placement.   Follow-Up: At [facility], you and your health needs are our priority.  As part of our continuing mission to provide you with exceptional heart care, we have created designated Provider Care Teams.  These Care Teams include your primary Cardiologist (physician) and Advanced Practice Providers (APPs -  Physician Assistants and Nurse Practitioners) who all work together to provide you with the care you need, when you need it. You will need a follow up appointment in 6  weeks.     Echocardiogram An echocardiogram is a procedure that uses painless sound waves (ultrasound) to produce an image of the heart. Images from an echocardiogram can provide important information about:  Signs of coronary artery disease (CAD).  Aneurysm detection. An aneurysm is a weak or damaged part of an artery wall that bulges out from the normal force of blood pumping through the body.  Heart size and shape. Changes in the size or shape of the heart can be associated with certain conditions, including heart failure, aneurysm, and CAD.  Heart muscle function.  Heart valve function.  Signs of a past heart attack.  Fluid buildup around the heart.  Thickening of the heart muscle.  A tumor or infectious growth around the heart valves. Tell a health care provider about:  Any allergies you have.  All medicines you are taking, including vitamins, herbs, eye drops, creams, and over-the-counter medicines.  Any blood disorders you have.  Any surgeries you have had.  Any medical conditions you have.  Whether you are pregnant or may be pregnant. What are the risks? Generally, this is a safe procedure. However, problems may occur, including:  Allergic reaction to dye (contrast) that may be used during the procedure. What happens before the procedure? No specific preparation is needed. You may eat and drink normally. What happens during the procedure?   An IV tube may be inserted into one of your veins.  You may receive contrast through this tube. A contrast is an injection that improves the quality of the pictures from your heart.  A gel  will be applied to your chest.  A wand-like tool (transducer) will be moved over your chest. The gel will help to transmit the sound waves from the transducer.  The sound waves will harmlessly bounce off of your heart to allow the heart images to be captured in real-time motion. The images will be recorded on a computer. The  procedure may vary among health care providers and hospitals. What happens after the procedure?  You may return to your normal, everyday life, including diet, activities, and medicines, unless your health care provider tells you not to do that. Summary  An echocardiogram is a procedure that uses painless sound waves (ultrasound) to produce an image of the heart.  Images from an echocardiogram can provide important information about the size and shape of your heart, heart muscle function, heart valve function, and fluid buildup around your heart.  You do not need to do anything to prepare before this procedure. You may eat and drink normally.  After the echocardiogram is completed, you may return to your normal, everyday life, unless your health care provider tells you not to do that. This information is not intended to replace advice given to you by your health care provider. Make sure you discuss any questions you have with your health care provider. Document Released: 03/10/2000 Document Revised: 04/15/2016 Document Reviewed: 04/15/2016 Elsevier Interactive Patient Education  2019 [employer].      Cardiac Nuclear Scan A cardiac nuclear scan is a test that is done to check the flow of blood to your heart. It is done when you are resting and when you are exercising. The test looks for problems such as:  Not enough blood reaching a portion of the heart.  The heart muscle not working as it should. You may need this test if:  You have heart disease.  You have had lab results that are not normal.  You have had heart surgery or a balloon procedure to open up blocked arteries (angioplasty).  You have chest pain.  You have shortness of breath. In this test, a special dye (tracer) is put into your bloodstream. The tracer will travel to your heart. A camera will then take pictures of your heart to see how the tracer moves through your heart. This test is usually done at a hospital and  takes 2-4 hours. Tell a doctor about:  Any allergies you have.  All medicines you are taking, including vitamins, herbs, eye drops, creams, and over-the-counter medicines.  Any problems you or family members have had with anesthetic medicines.  Any blood disorders you have.  Any surgeries you have had.  Any medical conditions you have.  Whether you are pregnant or may be pregnant. What are the risks? Generally, this is a safe test. However, problems may occur, such as:  Serious chest pain and heart attack. This is only a risk if the stress portion of the test is done.  Rapid heartbeat.  A feeling of warmth in your chest. This feeling usually does not last long.  Allergic reaction to the tracer. What happens before the test?  Ask your doctor about changing or stopping your normal medicines. This is important.  Follow instructions from your doctor about what you cannot eat or drink.  Remove your jewelry on the day of the test. What happens during the test?  An IV tube will be inserted into one of your veins.  Your doctor will give you a small amount of tracer through the IV tube.  You will wait for 20-40 minutes while the tracer moves through your bloodstream.  Your heart will be monitored with an electrocardiogram (ECG).  You will lie down on an exam table.  Pictures of your heart will be taken for about 15-20 minutes.  You may also have a stress test. For this test, one of these things may be done: ? You will be asked to exercise on a treadmill or a stationary bike. ? You will be given medicines that will make your heart work harder. This is done if you are unable to exercise.  When blood flow to your heart has peaked, a tracer will again be given through the IV tube.  After 20-40 minutes, you will get back on the exam table. More pictures will be taken of your heart.  Depending on the tracer that is used, more pictures may need to be taken 3-4 hours later.   Your IV tube will be removed when the test is over. The test may vary among doctors and hospitals. What happens after the test?  Ask your doctor: ? Whether you can return to your normal schedule, including diet, activities, and medicines. ? Whether you should drink more fluids. This will help to remove the tracer from your body. Drink enough fluid to keep your pee (urine) pale yellow.  Ask your doctor, or the department that is doing the test: ? When will my results be ready? ? How will I get my results? Summary  A cardiac nuclear scan is a test that is done to check the flow of blood to your heart.  Tell your doctor whether you are pregnant or may be pregnant.  Before the test, ask your doctor about changing or stopping your normal medicines. This is important.  Ask your doctor whether you can return to your normal activities. You may be asked to drink more fluids. This information is not intended to replace advice given to you by your health care provider. Make sure you discuss any questions you have with your health care provider. Document Released: 08/27/2017 Document Revised: 08/27/2017 Document Reviewed: 08/27/2017 Elsevier Interactive Patient Education  2019 [place] Placement, Care After This sheet gives you information about how to care for yourself after your procedure. Your health care provider may also give you more specific instructions. If you have problems or questions, contact your health care provider. What can I expect after the procedure? After the procedure, it is common to have:  Soreness or discomfort near the incision.  Some swelling or bruising near the incision. Follow these instructions at home: Incision care   Follow instructions from your health care provider about how to take care of your incision. Make sure you: ? Wash your hands with soap and water before you change your bandage (dressing). If soap and water  are not available, use hand sanitizer. ? Change your dressing as told by your health care provider. ? Keep your dressing dry. ? Leave stitches (sutures), skin glue, or adhesive strips in place. These skin closures may need to stay in place for 2 weeks or longer. If adhesive strip edges start to loosen and curl up, you may trim the loose edges. Do not remove adhesive strips completely unless your health care provider tells you to do that.  Check your incision area every day for signs of infection. Check for: ? Redness, swelling, or pain. ? Fluid or blood. ? Warmth. ? Pus or a bad smell.  Do not take baths, swim, or use a hot tub until your health care provider approves. Ask your health care provider if you can take showers. Activity   Return to your normal activities as told by your health care provider. Ask your health care provider what activities are safe for you.  Do not drive for 24 hours if you were given a sedative during your procedure. General instructions  Follow instructions from your health care provider about how to manage your implantable loop recorder and transmit the information. Learn how to activate a recording if this is necessary for your type of device.  Do not go through a metal detection gate, and do not let someone hold a metal detector over your chest. Show your ID card.  Do not have an MRI unless you check with your health care provider first.  Take over-the-counter and prescription medicines only as told by your health care provider.  Keep all follow-up visits as told by your health care provider. This is important. Contact a health care provider if:  You have redness, swelling, or pain around your incision.  You have a fever.  You have pain that is not relieved by your pain medicine.  You have triggered your device because of fainting (syncope) or because of a heartbeat that feels like it is racing, slow, fluttering, or skipping (palpitations). Get  help right away if you have:  Chest pain.  Difficulty breathing. Summary  After the procedure, it is common to have soreness or discomfort near the incision.  Change your dressing as told by your health care provider.  Follow instructions from your health care provider about how to manage your implantable loop recorder and transmit the information.  Keep all follow-up visits as told by your health care provider. This is important. This information is not intended to replace advice given to you by your health care provider. Make sure you discuss any questions you have with your health care provider. Document Released: 02/22/2015 Document Revised: 04/28/2017 Document Reviewed: 04/28/2017 Elsevier Interactive Patient Education  2019 [employer].

## 2018-08-21 NOTE — Addendum Note (Signed)
Addended by: Austin Miles on: 08/21/2018 02:04 PM   Modules accepted: Orders

## 2018-08-21 NOTE — Telephone Encounter (Signed)
Phoned patient, office visit scheduled today at 1315.

## 2018-08-22 ENCOUNTER — Telehealth: Payer: Self-pay | Admitting: *Deleted

## 2018-08-22 NOTE — Telephone Encounter (Signed)
New Message   Patient returning your call to give consent for virtual visit.

## 2018-08-22 NOTE — Telephone Encounter (Signed)

## 2018-08-22 NOTE — Telephone Encounter (Signed)
Calling patient today to discuss upcoming appointment.  We are currently trying to limit exposure to the virus that causes COVID-19 by seeing patients at home rather than in the office. We would like to schedule this appointment as a Virtual Appointment VIA Smartphone or Laptop. Unable to reach patient.  LVMTCB  

## 2018-08-23 ENCOUNTER — Telehealth: Payer: Self-pay | Admitting: Cardiology

## 2018-08-23 MED ORDER — HYDROCHLOROTHIAZIDE 25 MG PO TABS: 12.5000 mg | ORAL_TABLET | Freq: Every day | ORAL | 1 refills | Status: DC

## 2018-08-23 NOTE — Telephone Encounter (Signed)
Lets restart his HCTZ only and reassess Monday

## 2018-08-23 NOTE — Telephone Encounter (Signed)
Blood pressure  continues to rise after being off Lisinopril and Dylan Zimmerman has took him off Lisinopril at last visit. Please call patient.

## 2018-08-23 NOTE — Addendum Note (Signed)
Addended by: Stevan Born on: 08/23/2018 03:41 PM   Modules accepted: Orders

## 2018-08-23 NOTE — Telephone Encounter (Signed)
During patient's office visit on 08/21/2018, he was instructed to stop taking lisinopril-hydrochlorothiazide per Dr. Bettina Gavia. This is the second day patient has not taken this medication and his BP this morning was 157/76 and HR 61. He just checked his vital signs for a second time today: BP 160/81, HR 60.   Please advise.

## 2018-08-23 NOTE — Telephone Encounter (Signed)
Patient advised start HCTZ 25mg  0.5 tablet daily and continue monitoring BP from home.  Patient states that he will not be able to pick up medication from pharmacy until tomorrow.  He will start medication tomorrow and monitor BP Saturday, Sunday, and Monday.  He will call office with BP readings from home on Tuesday.  Rx sent to pharmacy.  Patient agreed to plan and verbalized understanding.

## 2018-08-26 ENCOUNTER — Telehealth (HOSPITAL_COMMUNITY): Payer: Self-pay

## 2018-08-26 NOTE — Telephone Encounter (Signed)
Spoke with the patient and his instructions were given. Pt stated that he understood and would be here. S.Williams EMTP

## 2018-08-27 ENCOUNTER — Other Ambulatory Visit: Payer: Self-pay

## 2018-08-27 ENCOUNTER — Ambulatory Visit (HOSPITAL_COMMUNITY): Payer: Medicare Other | Attending: Cardiology

## 2018-08-27 VITALS — Ht 71.0 in | Wt 252.0 lb

## 2018-08-27 DIAGNOSIS — R002 Palpitations: Secondary | ICD-10-CM | POA: Insufficient documentation

## 2018-08-27 DIAGNOSIS — I447 Left bundle-branch block, unspecified: Secondary | ICD-10-CM | POA: Diagnosis not present

## 2018-08-27 DIAGNOSIS — R55 Syncope and collapse: Secondary | ICD-10-CM | POA: Diagnosis not present

## 2018-08-27 DIAGNOSIS — I10 Essential (primary) hypertension: Secondary | ICD-10-CM | POA: Diagnosis not present

## 2018-08-27 DIAGNOSIS — I493 Ventricular premature depolarization: Secondary | ICD-10-CM | POA: Insufficient documentation

## 2018-08-27 LAB — MYOCARDIAL PERFUSION IMAGING
LV dias vol: 101 mL (ref 62–150)
LV sys vol: 36 mL
Peak HR: 73 {beats}/min
Rest HR: 56 {beats}/min
SDS: 2
SRS: 2
SSS: 4
TID: 0.99

## 2018-08-27 MED ORDER — REGADENOSON 0.4 MG/5ML IV SOLN
0.4000 mg | Freq: Once | INTRAVENOUS | Status: AC
  Administered 2018-08-27: 0.4 mg via INTRAVENOUS

## 2018-08-27 MED ORDER — TECHNETIUM TC 99M TETROFOSMIN IV KIT
32.8000 | PACK | Freq: Once | INTRAVENOUS | Status: AC | PRN
  Administered 2018-08-27: 32.8 via INTRAVENOUS
  Filled 2018-08-27: qty 33

## 2018-08-27 MED ORDER — TECHNETIUM TC 99M TETROFOSMIN IV KIT
10.9000 | PACK | Freq: Once | INTRAVENOUS | Status: AC | PRN
  Administered 2018-08-27: 10.9 via INTRAVENOUS
  Filled 2018-08-27: qty 11

## 2018-08-28 ENCOUNTER — Encounter (HOSPITAL_COMMUNITY): Payer: Medicare Other

## 2018-08-29 ENCOUNTER — Telehealth: Payer: Self-pay | Admitting: Cardiology

## 2018-08-29 NOTE — Telephone Encounter (Signed)
° ° °  Patient has question about St Jude devices versus other devices. He wants to do his own research prior to appointment on 6/5. Requesting call from staff

## 2018-08-29 NOTE — Telephone Encounter (Signed)
Will discuss further w/ Dr. Curt Bears tomorrow. Pt lives in [place] and doesn't have good service. He and Camnitz will discuss more tomorrow before determining. Pt agreeable to plan.

## 2018-08-30 ENCOUNTER — Telehealth (INDEPENDENT_AMBULATORY_CARE_PROVIDER_SITE_OTHER): Payer: Medicare Other | Admitting: Cardiology

## 2018-08-30 ENCOUNTER — Other Ambulatory Visit: Payer: Self-pay

## 2018-08-30 DIAGNOSIS — R55 Syncope and collapse: Secondary | ICD-10-CM

## 2018-08-30 NOTE — Progress Notes (Signed)
Virtual Visit via Video Note   This visit type was conducted due to national recommendations for restrictions regarding the COVID-19 Pandemic (e.g. social distancing) in an effort to limit this patient's exposure and mitigate transmission in our community.  Due to his co-morbid illnesses, this patient is at least at moderate risk for complications without adequate follow up.  This format is felt to be most appropriate for this patient at this time.  All issues noted in this document were discussed and addressed.  A limited physical exam was performed with this format.  Please refer to the patient's chart for his consent to telehealth for [facility].   Date:  08/30/2018   ID:  Dylan Zimmerman, DOB 1940-12-16, MRN [id number]  Patient Location: Home Provider Location: Home  PCP:  Lujean Amel, MD  Cardiologist:  Shirlee More, MD  Electrophysiologist:  None   Evaluation Performed:  Consultation - Dylan Zimmerman was referred by Shirlee More for the evaluation of syncope.  Chief Complaint:  syncope  History of Present Illness:    Dylan Zimmerman is a 78 y.o. male with he has a history of syncope.  This was initially thought to be vasovagal.  He also has history of PVCs and left bundle branch block.  He had a Myoview done that was without major abnormality.  He has had 2 episodes of syncope that were of concern.  He was working outside in the [employer].  He stood up and had the sensation that he would faint.  He fell to the ground and struck his head.  The second episode occurred when he was in bed and was witnessed by his wife.  He had marked pallor.  Afterwards his heart rate was 48.  His initial 2 episodes he feels occurred when he stood up too fast.  His episode and that was very different.  The patient does not have symptoms concerning for COVID-19 infection (fever, chills, cough, or new shortness of breath).    Past Medical History:  Diagnosis Date  . Essential hypertension 06/18/2018   . Hyperlipidemia   . Hypertension   . Obesity 06/18/2018  . Palpitations 06/18/2018  . Syncope and collapse    Past Surgical History:  Procedure Laterality Date  . CATARACT EXTRACTION Bilateral   . LITHOTRIPSY    . TONSILLECTOMY       Current Meds  Medication Sig  . atorvastatin (LIPITOR) 10 MG tablet Take 10 mg by mouth daily.  . hydrochlorothiazide (HYDRODIURIL) 25 MG tablet Take 0.5 tablets (12.5 mg total) by mouth daily.  . Multiple Vitamin (MULTIVITAMIN) tablet Take 1 tablet by mouth daily.  . sildenafil (REVATIO) 20 MG tablet Take 1 tablet by mouth as needed.  . zolpidem (AMBIEN) 10 MG tablet Take 1 tablet by mouth at bedtime as needed.     Allergies:   Shellfish allergy; Bactrim [sulfamethoxazole-trimethoprim]; and Iodine   Social History   Tobacco Use  . Smoking status: Never Smoker  . Smokeless tobacco: Never Used  Substance Use Topics  . Alcohol use: Never    Frequency: Never  . Drug use: Not Currently     Family Hx: The patient's family history includes Cancer in his father and mother; Heart attack in his maternal grandfather and paternal grandmother; Heart disease in his paternal grandfather and paternal grandmother.  ROS:   Please see the history of present illness.     All other systems reviewed and are negative.   Prior CV studies:   The following studies  were reviewed today:  Myoview 08/27/18  Nuclear stress EF: 64%.  There was no ST segment deviation noted during stress.  Defect 1: There is a small defect of mild severity present in the basal inferoseptal and mid inferoseptal location.  This is a low risk study.  The left ventricular ejection fraction is normal (55-65%).  Mild fixed inferoseptal defect, likely artifact  No evidence of ischemia.  14-day monitor 07/08/2018 personally reviewed The rhythm throughout is sinus first-degree heart block left bundle branch block morphology.  The minimum average and maximum heart rates of 44,  60  and 125 bpm. Rare ventricular ectopy is seen less than 1% PVCs and couplets.  There is 1 isolated 4 beat run of PVCs present. Supraventricular ectopy is rare there are 4 runs of atrial premature contractions the longest and the fastest is 15 complexes at a rate of 145 bpm. One triggered event was present sinus rhythm 59 bpm  Labs/Other Tests and Data Reviewed:    EKG:  An ECG dated 08/22/18 was personally reviewed today and demonstrated:  SR, LBBB  Recent Labs: No results found for requested labs within last 8760 hours.   Recent Lipid Panel No results found for: CHOL, TRIG, HDL, CHOLHDL, LDLCALC, LDLDIRECT  Wt Readings from Last 3 Encounters:  08/27/18 252 lb (114.3 kg)  08/21/18 252 lb (114.3 kg)  06/19/18 255 lb 1.3 oz (115.7 kg)     Objective:    Vital Signs:  BP 133/65   Pulse (!) 57    VITAL SIGNS:  reviewed GEN:  no acute distress EYES:  sclerae anicteric, EOMI - Extraocular Movements Intact RESPIRATORY:  normal respiratory effort, symmetric expansion CARDIOVASCULAR:  no peripheral edema SKIN:  no rash, lesions or ulcers. MUSCULOSKELETAL:  no obvious deformities. NEURO:  alert and oriented x 3, no obvious focal deficit PSYCH:  normal affect  ASSESSMENT & PLAN:    1. Recurrent syncope: At this point, it is unclear to me as to the cause of his recurrent syncope.  He does have a left bundle branch block, but a normal Myoview without evidence of ischemia.  He does have an echocardiogram upcoming.  If the echocardiogram is unrevealing, he would likely benefit from long-term monitoring with an implantable monitor.  The patient is quite insistent that this is a Psychologist, educational.  We will plan for implantation after his echo. 2. PVCs:Less than 1% on most recent monitor.  No changes. 3. Hypertension: Plan per primary cardiology  COVID-19 Education: The signs and symptoms of COVID-19 were discussed with the patient and how to seek care for testing (follow up with PCP or  arrange E-visit).  The importance of social distancing was discussed today.  Time:   Today, I have spent 15 minutes with the patient with telehealth technology discussing the above problems.     Medication Adjustments/Labs and Tests Ordered: Current medicines are reviewed at length with the patient today.  Concerns regarding medicines are outlined above.   Tests Ordered: No orders of the defined types were placed in this encounter.   Medication Changes: No orders of the defined types were placed in this encounter.   Disposition:  Follow up Pending monitor results  Signed, Will Meredith Leeds, MD  08/30/2018 11:00 AM    [place]

## 2018-09-04 ENCOUNTER — Ambulatory Visit (HOSPITAL_BASED_OUTPATIENT_CLINIC_OR_DEPARTMENT_OTHER)
Admission: RE | Admit: 2018-09-04 | Discharge: 2018-09-04 | Disposition: A | Discharge status: Home / Self Care | Payer: Medicare Other | Source: Ambulatory Visit | Attending: Cardiology | Admitting: Cardiology

## 2018-09-04 ENCOUNTER — Other Ambulatory Visit: Payer: Self-pay

## 2018-09-04 DIAGNOSIS — I493 Ventricular premature depolarization: Secondary | ICD-10-CM | POA: Insufficient documentation

## 2018-09-04 DIAGNOSIS — R002 Palpitations: Secondary | ICD-10-CM | POA: Diagnosis not present

## 2018-09-04 DIAGNOSIS — R55 Syncope and collapse: Secondary | ICD-10-CM | POA: Diagnosis not present

## 2018-09-04 DIAGNOSIS — I10 Essential (primary) hypertension: Secondary | ICD-10-CM | POA: Insufficient documentation

## 2018-09-04 NOTE — Progress Notes (Signed)
  Echocardiogram 2D Echocardiogram has been performed.  Dylan Zimmerman 09/04/2018, 2:03 PM

## 2018-09-15 ENCOUNTER — Other Ambulatory Visit: Payer: Self-pay | Admitting: Cardiology

## 2018-09-23 ENCOUNTER — Ambulatory Visit: Payer: Medicare Other | Admitting: Cardiology

## 2018-09-23 NOTE — Progress Notes (Signed)
Cardiology Office Note:    Date:  09/24/2018   ID:  Dylan Zimmerman, DOB 03-Jan-1941, MRN [id number]  PCP:  Lujean Amel, MD  Cardiologist:  Shirlee More, MD    Referring MD: Lujean Amel, MD    ASSESSMENT:    1. Syncope and collapse   2. Essential hypertension   3. Mixed hyperlipidemia    PLAN:    In order of problems listed above:  1. Await implantation of loop recorder will call EP today he has had no recurrence 2. BP not at target increase his thiazide diuretic recheck renal function 3. Continue statin check liver function lipid profile   Next appointment: 3 months   Medication Adjustments/Labs and Tests Ordered: Current medicines are reviewed at length with the patient today.  Concerns regarding medicines are outlined above.  No orders of the defined types were placed in this encounter.  No orders of the defined types were placed in this encounter.   Chief Complaint  Patient presents with  . Follow-up    after EP consult for   . Loss of Consciousness    History of Present Illness:    Dylan Zimmerman is a 78 y.o. male with a hx of syncope, PVCs and left bundle branch block last seen 08/21/2018.  He has been seen by EP and is planned for an implanted loop recorder with recurrent cryptic syncope Compliance with diet, lifestyle and medications: Yes  His home blood pressures been running in the range of 539 systolic we will increase the dose of his hydrochlorothiazide and for now I am not can of put him back on an ACE inhibitor is with concerns about orthostatic hypotension.  He has been seen by EP and he is awaiting a phone call regarding his loop recorder and he wants the average [name] because he lacks self-service [place] at his second home.  No palpitation chest pain or syncope.  Reviewed the results of both echocardiogram myocardial perfusion study that showed no evidence of cardiomyopathy or CAD Past Medical History:  Diagnosis Date  .  Essential hypertension 06/18/2018  . Hyperlipidemia   . Hypertension   . Obesity 06/18/2018  . Palpitations 06/18/2018  . Syncope and collapse     Past Surgical History:  Procedure Laterality Date  . CATARACT EXTRACTION Bilateral   . LITHOTRIPSY    . TONSILLECTOMY      Current Medications: Current Meds  Medication Sig  . atorvastatin (LIPITOR) 10 MG tablet Take 10 mg by mouth daily.  . hydrochlorothiazide (HYDRODIURIL) 25 MG tablet TAKE 1/2 TABLET BY MOUTH EVERY DAY  . Multiple Vitamin (MULTIVITAMIN) tablet Take 1 tablet by mouth daily.  . sildenafil (REVATIO) 20 MG tablet Take 1 tablet by mouth as needed.  . zolpidem (AMBIEN) 10 MG tablet Take 1 tablet by mouth at bedtime as needed.     Allergies:   Shellfish allergy, Bactrim [sulfamethoxazole-trimethoprim], and Iodine   Social History   Socioeconomic History  . Marital status: Married    Spouse name: Not on file  . Number of children: Not on file  . Years of education: Not on file  . Highest education level: Not on file  Occupational History  . Not on file  Social Needs  . Financial resource strain: Not on file  . Food insecurity    Worry: Not on file    Inability: Not on file  . Transportation needs    Medical: Not on file    Non-medical: Not on file  Tobacco Use  . Smoking status: Never Smoker  . Smokeless tobacco: Never Used  Substance and Sexual Activity  . Alcohol use: Never    Frequency: Never  . Drug use: Not Currently  . Sexual activity: Not on file  Lifestyle  . Physical activity    Days per week: Not on file    Minutes per session: Not on file  . Stress: Not on file  Relationships  . Social Herbalist on phone: Not on file    Gets together: Not on file    Attends religious service: Not on file    Active member of club or organization: Not on file    Attends meetings of clubs or organizations: Not on file    Relationship status: Not on file  Other Topics Concern  . Not on file   Social History Narrative  . Not on file     Family History: The patient's family history includes Cancer in his father and mother; Heart attack in his maternal grandfather and paternal grandmother; Heart disease in his paternal grandfather and paternal grandmother. ROS:   Please see the history of present illness.    All other systems reviewed and are negative.  EKGs/Labs/Other Studies Reviewed:    The following studies were reviewed today:  EKG:  EKG ordered today and personally reviewed.  The ekg ordered today demonstrates   09/04/2018: Echocardiogram shows mild concentric LVH normal left ventricular systolic function EF 60 to 65% and no significant valvular abnormality.  08/27/2018: Lexiscan Myoview shows an ejection fraction of 64% fixed defect felt to be due to attenuation no evidence of ischemia  Recent Labs: No results found for requested labs within last 8760 hours.  Recent Lipid Panel No results found for: CHOL, TRIG, HDL, CHOLHDL, VLDL, LDLCALC, LDLDIRECT  Physical Exam:    VS:  BP (!) 162/72 (BP Location: Right Arm, Patient Position: Sitting, Cuff Size: Large)   Pulse 71   Temp (!) 97.5 F (36.4 C)   Ht 5\' 11"  (1.803 m)   Wt 249 lb 12.8 oz (113.3 kg)   SpO2 97%   BMI 34.84 kg/m     Wt Readings from Last 3 Encounters:  09/24/18 249 lb 12.8 oz (113.3 kg)  08/27/18 252 lb (114.3 kg)  08/21/18 252 lb (114.3 kg)     GEN:  Well nourished, well developed in no acute distress HEENT: Normal NECK: No JVD; No carotid bruits LYMPHATICS: No lymphadenopathy CARDIAC: RRR, no murmurs, rubs, gallops RESPIRATORY:  Clear to auscultation without rales, wheezing or rhonchi  ABDOMEN: Soft, non-tender, non-distended MUSCULOSKELETAL:  No edema; No deformity  SKIN: Warm and dry NEUROLOGIC:  Alert and oriented x 3 PSYCHIATRIC:  Normal affect    Signed, Shirlee More, MD  09/24/2018 9:21 AM    [place]

## 2018-09-24 ENCOUNTER — Encounter: Payer: Self-pay | Admitting: Cardiology

## 2018-09-24 ENCOUNTER — Telehealth: Payer: Self-pay | Admitting: *Deleted

## 2018-09-24 ENCOUNTER — Ambulatory Visit (INDEPENDENT_AMBULATORY_CARE_PROVIDER_SITE_OTHER): Payer: Medicare Other | Admitting: Cardiology

## 2018-09-24 VITALS — BP 162/72 | HR 71 | Temp 97.5°F | Ht 71.0 in | Wt 249.8 lb

## 2018-09-24 DIAGNOSIS — R55 Syncope and collapse: Secondary | ICD-10-CM

## 2018-09-24 DIAGNOSIS — I10 Essential (primary) hypertension: Secondary | ICD-10-CM

## 2018-09-24 DIAGNOSIS — E782 Mixed hyperlipidemia: Secondary | ICD-10-CM | POA: Diagnosis not present

## 2018-09-24 MED ORDER — HYDROCHLOROTHIAZIDE 25 MG PO TABS: 25.0000 mg | ORAL_TABLET | Freq: Every day | ORAL | 0 refills | Status: DC

## 2018-09-24 NOTE — Telephone Encounter (Signed)
ILR scheduled for 7/10, instructions reviewed w/ pt. Covid screening scheduled for 7/7, instructions reviewed. Aware office will contact to arrange post procedure f/u appts. Patient verbalized understanding and agreeable to plan.

## 2018-09-24 NOTE — Patient Instructions (Signed)
Medication Instructions:  Your physician has recommended you make the following change in your medication:  INCREASE hydrochlorothiazide 25 mg: Take 1 tablet daily   If you need a refill on your cardiac medications before your next appointment, please call your pharmacy.   Lab work: Your physician recommends that you return for lab work today: CMP, lipid panel.   If you have labs (blood work) drawn today and your tests are completely normal, you will receive your results only by: Marland Kitchen MyChart Message (if you have MyChart) OR . A paper copy in the mail If you have any lab test that is abnormal or we need to change your treatment, we will call you to review the results.  Testing/Procedures: None  Follow-Up: At [facility], you and your health needs are our priority.  As part of our continuing mission to provide you with exceptional heart care, we have created designated Provider Care Teams.  These Care Teams include your primary Cardiologist (physician) and Advanced Practice Providers (APPs -  Physician Assistants and Nurse Practitioners) who all work together to provide you with the care you need, when you need it. You will need a follow up appointment in 3 months.

## 2018-09-25 LAB — COMPREHENSIVE METABOLIC PANEL
ALT: 19 IU/L (ref 0–44)
AST: 21 IU/L (ref 0–40)
Albumin/Globulin Ratio: 2 (ref 1.2–2.2)
Albumin: 4.3 g/dL (ref 3.7–4.7)
Alkaline Phosphatase: 60 IU/L (ref 39–117)
BUN/Creatinine Ratio: 18 (ref 10–24)
BUN: 19 mg/dL (ref 8–27)
Bilirubin Total: 0.4 mg/dL (ref 0.0–1.2)
CO2: 22 mmol/L (ref 20–29)
Calcium: 9.7 mg/dL (ref 8.6–10.2)
Chloride: 105 mmol/L (ref 96–106)
Creatinine, Ser: 1.05 mg/dL (ref 0.76–1.27)
GFR calc Af Amer: 78 mL/min/{1.73_m2} (ref >59)
GFR calc non Af Amer: 67 mL/min/{1.73_m2} (ref >59)
Globulin, Total: 2.2 g/dL (ref 1.5–4.5)
Glucose: 132 mg/dL — ABNORMAL HIGH (ref 65–99)
Potassium: 4.4 mmol/L (ref 3.5–5.2)
Sodium: 142 mmol/L (ref 134–144)
Total Protein: 6.5 g/dL (ref 6.0–8.5)

## 2018-09-25 LAB — LIPID PANEL
Chol/HDL Ratio: 4.6 ratio (ref 0.0–5.0)
Cholesterol, Total: 158 mg/dL (ref 100–199)
HDL: 34 mg/dL — ABNORMAL LOW (ref >39)
Triglycerides: 453 mg/dL — ABNORMAL HIGH (ref 0–149)

## 2018-09-26 ENCOUNTER — Telehealth: Payer: Self-pay | Admitting: *Deleted

## 2018-09-26 DIAGNOSIS — E782 Mixed hyperlipidemia: Secondary | ICD-10-CM

## 2018-09-26 NOTE — Telephone Encounter (Signed)
-----   Message from Richardo Priest, MD sent at 09/25/2018 11:15 AM EDT ----- Normal or stable result  Laboratories are good except his triglycerides are quite elevated, unfortunately this was not a fasting and I would like him at his convenience to have a fasting lipid profile done

## 2018-09-26 NOTE — Telephone Encounter (Signed)
Telephone call to patient. Informed of lab results and need for a fasting lipid profile at his convienence. Patient verbalized understanding.

## 2018-09-30 DIAGNOSIS — D1801 Hemangioma of skin and subcutaneous tissue: Secondary | ICD-10-CM | POA: Diagnosis not present

## 2018-09-30 DIAGNOSIS — L570 Actinic keratosis: Secondary | ICD-10-CM | POA: Diagnosis not present

## 2018-09-30 DIAGNOSIS — L821 Other seborrheic keratosis: Secondary | ICD-10-CM | POA: Diagnosis not present

## 2018-09-30 DIAGNOSIS — Z85828 Personal history of other malignant neoplasm of skin: Secondary | ICD-10-CM | POA: Diagnosis not present

## 2018-09-30 DIAGNOSIS — D2221 Melanocytic nevi of right ear and external auricular canal: Secondary | ICD-10-CM | POA: Diagnosis not present

## 2018-10-01 ENCOUNTER — Other Ambulatory Visit (HOSPITAL_COMMUNITY)
Admission: RE | Admit: 2018-10-01 | Discharge: 2018-10-01 | Disposition: A | Discharge status: Home / Self Care | Payer: Medicare Other | Source: Ambulatory Visit | Attending: Cardiology | Admitting: Cardiology

## 2018-10-01 DIAGNOSIS — Z01812 Encounter for preprocedural laboratory examination: Secondary | ICD-10-CM | POA: Diagnosis not present

## 2018-10-01 DIAGNOSIS — E782 Mixed hyperlipidemia: Secondary | ICD-10-CM | POA: Diagnosis not present

## 2018-10-01 DIAGNOSIS — Z1159 Encounter for screening for other viral diseases: Secondary | ICD-10-CM | POA: Insufficient documentation

## 2018-10-01 LAB — SARS CORONAVIRUS 2 (TAT 6-24 HRS): SARS Coronavirus 2: NEGATIVE

## 2018-10-02 LAB — LIPID PANEL
Chol/HDL Ratio: 4.3 ratio (ref 0.0–5.0)
Cholesterol, Total: 149 mg/dL (ref 100–199)
HDL: 35 mg/dL — ABNORMAL LOW (ref >39)
LDL Calculated: 61 mg/dL (ref 0–99)
Triglycerides: 263 mg/dL — ABNORMAL HIGH (ref 0–149)
VLDL Cholesterol Cal: 53 mg/dL — ABNORMAL HIGH (ref 5–40)

## 2018-10-04 ENCOUNTER — Encounter (HOSPITAL_COMMUNITY)
Admission: RE | Disposition: A | Discharge status: Home / Self Care | Payer: Self-pay | Source: Home / Self Care | Attending: Cardiology

## 2018-10-04 ENCOUNTER — Ambulatory Visit (HOSPITAL_COMMUNITY)
Admission: RE | Admit: 2018-10-04 | Discharge: 2018-10-04 | Disposition: A | Discharge status: Home / Self Care | Payer: Medicare Other | Attending: Cardiology | Admitting: Cardiology

## 2018-10-04 ENCOUNTER — Encounter (HOSPITAL_COMMUNITY): Payer: Self-pay | Admitting: Cardiology

## 2018-10-04 ENCOUNTER — Other Ambulatory Visit: Payer: Self-pay

## 2018-10-04 DIAGNOSIS — R55 Syncope and collapse: Secondary | ICD-10-CM | POA: Diagnosis not present

## 2018-10-04 DIAGNOSIS — Z6834 Body mass index (BMI) 34.0-34.9, adult: Secondary | ICD-10-CM | POA: Insufficient documentation

## 2018-10-04 DIAGNOSIS — E785 Hyperlipidemia, unspecified: Secondary | ICD-10-CM | POA: Insufficient documentation

## 2018-10-04 DIAGNOSIS — E669 Obesity, unspecified: Secondary | ICD-10-CM | POA: Diagnosis not present

## 2018-10-04 DIAGNOSIS — I10 Essential (primary) hypertension: Secondary | ICD-10-CM | POA: Insufficient documentation

## 2018-10-04 DIAGNOSIS — Z79899 Other long term (current) drug therapy: Secondary | ICD-10-CM | POA: Insufficient documentation

## 2018-10-04 HISTORY — PX: LOOP RECORDER INSERTION: EP1214

## 2018-10-04 SURGERY — LOOP RECORDER INSERTION

## 2018-10-04 MED ORDER — LIDOCAINE-EPINEPHRINE 1 %-1:100000 IJ SOLN
INTRAMUSCULAR | Status: DC | PRN
  Administered 2018-10-04: 20 mL

## 2018-10-04 MED ORDER — LIDOCAINE-EPINEPHRINE 1 %-1:100000 IJ SOLN
INTRAMUSCULAR | Status: AC
  Filled 2018-10-04: qty 1

## 2018-10-04 SURGICAL SUPPLY — 2 items
HRT MONITOR CONFIRM RX (Prosthesis & Implant Heart) ×1 IMPLANT
PACK LOOP INSERTION (CUSTOM PROCEDURE TRAY) ×2 IMPLANT

## 2018-10-04 NOTE — Discharge Instructions (Signed)
Post implant care instructions Keep incision clean and dry for 3 days. You can remove outer dressing tomorrow. Leave steri-strips (little pieces of tape) on until seen in the office for wound check appointment. Call the office [phone]) for redness, drainage, swelling, or fever.

## 2018-10-04 NOTE — H&P (Signed)
Dylan Zimmerman has presented today for surgery, with the diagnosis of syncope.  The various methods of treatment have been discussed with the patient and family. After consideration of risks, benefits and other options for treatment, the patient has consented to  Procedure(s): Loop monitor implant as a surgical intervention .  Risks include but not limited to bleeding, tamponade, infection, pneumothorax, among others. The patient's history has been reviewed, patient examined, no change in status, stable for surgery.  I have reviewed the patient's chart and labs.  Questions were answered to the patient's satisfaction.    Will Curt Bears, MD 10/04/2018 12:08 PM

## 2018-10-09 DIAGNOSIS — H53021 Refractive amblyopia, right eye: Secondary | ICD-10-CM | POA: Diagnosis not present

## 2018-10-09 DIAGNOSIS — H35373 Puckering of macula, bilateral: Secondary | ICD-10-CM | POA: Diagnosis not present

## 2018-10-09 DIAGNOSIS — H40053 Ocular hypertension, bilateral: Secondary | ICD-10-CM | POA: Diagnosis not present

## 2018-10-11 ENCOUNTER — Encounter (HOSPITAL_COMMUNITY): Payer: Self-pay | Admitting: Cardiology

## 2018-10-14 ENCOUNTER — Telehealth: Payer: Self-pay

## 2018-10-14 NOTE — Telephone Encounter (Signed)
    COVID-19 Pre-Screening Questions:  . In the past 7 to 10 days have you had a cough,  shortness of breath, headache, congestion, fever (100 or greater) body aches, chills, sore throat, or sudden loss of taste or sense of smell? No . Have you been around anyone with known Covid 19. No . Have you been around anyone who is awaiting Covid 19 test results in the past 7 to 10 days? No . Have you been around anyone who has been exposed to Covid 19, or has mentioned symptoms of Covid 19 within the past 7 to 10 days? No  If you have any concerns/questions about symptoms patients report during screening (either on the phone or at threshold). Contact the provider seeing the patient or DOD for further guidance.  If neither are available contact a member of the leadership team.        Pt answered No to all Covid-19 prescreening questions. I asked the pt to wear a mask if he has one. I also asked the pt to come to his appointment alone if he can physically do so because we are reducing the number of people coming into the office. I also told the pt if anything changes to please call to let us know. The pt verbalized understanding.

## 2018-10-15 ENCOUNTER — Ambulatory Visit (INDEPENDENT_AMBULATORY_CARE_PROVIDER_SITE_OTHER): Payer: Medicare Other | Admitting: Student

## 2018-10-15 ENCOUNTER — Other Ambulatory Visit: Payer: Self-pay

## 2018-10-15 DIAGNOSIS — R55 Syncope and collapse: Secondary | ICD-10-CM

## 2018-10-15 NOTE — Progress Notes (Signed)
ILR wound check in clinic. Steri strips had previously fallen off. Wound well healed. Home monitor transmitting nightly. No episodes. Questions answered. R waves 0.25 mV. Attempted to change sensitivity to 0.05 mV, but had oversensing, so left at 0.08mV.     Legrand Como [address]" [place], PA-C 10/15/2018 8:52 AM

## 2018-10-17 ENCOUNTER — Ambulatory Visit: Payer: Medicare Other

## 2018-11-16 DIAGNOSIS — Z23 Encounter for immunization: Secondary | ICD-10-CM | POA: Diagnosis not present

## 2018-12-07 DIAGNOSIS — M545 Low back pain, unspecified: Secondary | ICD-10-CM

## 2018-12-07 HISTORY — DX: Low back pain, unspecified: M54.50

## 2018-12-16 DIAGNOSIS — I10 Essential (primary) hypertension: Secondary | ICD-10-CM | POA: Diagnosis not present

## 2018-12-16 DIAGNOSIS — M25551 Pain in right hip: Secondary | ICD-10-CM | POA: Diagnosis not present

## 2018-12-17 ENCOUNTER — Emergency Department (HOSPITAL_BASED_OUTPATIENT_CLINIC_OR_DEPARTMENT_OTHER): Payer: Medicare Other

## 2018-12-17 ENCOUNTER — Other Ambulatory Visit: Payer: Self-pay

## 2018-12-17 ENCOUNTER — Encounter (HOSPITAL_BASED_OUTPATIENT_CLINIC_OR_DEPARTMENT_OTHER): Payer: Self-pay | Admitting: *Deleted

## 2018-12-17 ENCOUNTER — Emergency Department (HOSPITAL_BASED_OUTPATIENT_CLINIC_OR_DEPARTMENT_OTHER)
Admission: EM | Admit: 2018-12-17 | Discharge: 2018-12-18 | Disposition: A | Discharge status: Home / Self Care | Payer: Medicare Other | Attending: Emergency Medicine | Admitting: Emergency Medicine

## 2018-12-17 DIAGNOSIS — M7918 Myalgia, other site: Secondary | ICD-10-CM

## 2018-12-17 DIAGNOSIS — Z79899 Other long term (current) drug therapy: Secondary | ICD-10-CM | POA: Diagnosis not present

## 2018-12-17 DIAGNOSIS — M25551 Pain in right hip: Secondary | ICD-10-CM | POA: Diagnosis not present

## 2018-12-17 DIAGNOSIS — I10 Essential (primary) hypertension: Secondary | ICD-10-CM | POA: Diagnosis not present

## 2018-12-17 DIAGNOSIS — M533 Sacrococcygeal disorders, not elsewhere classified: Secondary | ICD-10-CM | POA: Diagnosis not present

## 2018-12-17 NOTE — ED Triage Notes (Signed)
Right hip pain. He was given Voltaren yesterday for the pain but it is not helping his pain.

## 2018-12-18 DIAGNOSIS — M7918 Myalgia, other site: Secondary | ICD-10-CM | POA: Diagnosis not present

## 2018-12-18 MED ORDER — HYDROCODONE-ACETAMINOPHEN 5-325 MG PO TABS
1.0000 | ORAL_TABLET | Freq: Once | ORAL | Status: AC
  Administered 2018-12-18: 1 via ORAL
  Filled 2018-12-18: qty 1

## 2018-12-18 MED ORDER — DEXAMETHASONE SODIUM PHOSPHATE 10 MG/ML IJ SOLN
10.0000 mg | Freq: Once | INTRAMUSCULAR | Status: AC
  Administered 2018-12-18: 10 mg via INTRAMUSCULAR
  Filled 2018-12-18: qty 1

## 2018-12-18 MED ORDER — METHOCARBAMOL 500 MG PO TABS: 500.0000 mg | ORAL_TABLET | Freq: Two times a day (BID) | ORAL | 0 refills | Status: DC

## 2018-12-18 NOTE — ED Notes (Signed)
PT is frustrated about hs wait time. Requesting water and crutches due to feeling like he may pass out. RN explained that lying down would be best since he felt so badly. Water given.

## 2018-12-18 NOTE — ED Notes (Signed)
Dr cardama at bedside  

## 2018-12-18 NOTE — ED Provider Notes (Signed)
[facility] EMERGENCY DEPARTMENT Provider Note  CSN: [id number] Arrival date & time: 12/17/18 2146  Chief Complaint(s) Hip Pain  HPI Dylan Zimmerman is a 78 y.o. male   HPI  1 week, gradual right aching/cramping buttock pain.  Initially alleviated by certain positions.  Patient saw his PCP who prescribed diclofenac which is not helping.  Now pain is not controlled with certain positions.  Patient denies any trauma or falls.  Pain worse with sitting and range of motion.  Patient denies any lower extremity weakness or loss of sensation.  No bladder/bowel incontinence.    Past Medical History Past Medical History:  Diagnosis Date  . Essential hypertension 06/18/2018  . Hyperlipidemia   . Hypertension   . Obesity 06/18/2018  . Palpitations 06/18/2018  . Syncope and collapse    Patient Active Problem List   Diagnosis Date Noted  . PVC's (premature ventricular contractions) 08/21/2018  . Syncope and collapse 06/19/2018  . Palpitations 06/18/2018  . Hyperlipidemia 06/18/2018  . Essential hypertension 06/18/2018  . Obesity 06/18/2018   Home Medication(s) Prior to Admission medications   Medication Sig Start Date End Date Taking? Authorizing Provider  Diclofenac Sodium (VOLTAREN PO) Take by mouth.   Yes [provider]  atorvastatin (LIPITOR) 10 MG tablet Take 10 mg by mouth daily. 05/15/18   [provider]  hydrochlorothiazide (HYDRODIURIL) 25 MG tablet Take 1 tablet (25 mg total) by mouth daily. 09/24/18   Richardo Priest, MD  methocarbamol (ROBAXIN) 500 MG tablet Take 1 tablet (500 mg total) by mouth 2 (two) times daily. 12/18/18   Fatima Blank, MD  Multiple Vitamin (MULTIVITAMIN) tablet Take 1 tablet by mouth daily.    [provider]  sildenafil (REVATIO) 20 MG tablet Take 1 tablet by mouth as needed. 06/07/18   [provider]  zolpidem (AMBIEN) 10 MG tablet Take 10 mg by mouth at bedtime as needed for sleep.  05/15/18    [provider]                                                                                                                                    Past Surgical History Past Surgical History:  Procedure Laterality Date  . CATARACT EXTRACTION Bilateral   . LITHOTRIPSY    . LOOP RECORDER INSERTION N/A 10/04/2018   Procedure: LOOP RECORDER INSERTION;  Surgeon: Constance Haw, MD;  Location: [facility];  Service: Cardiovascular;  Laterality: N/A;  . TONSILLECTOMY     Family History Family History  Problem Relation Age of Onset  . Cancer Mother   . Cancer Father   . Heart attack Maternal Grandfather   . Heart disease Paternal Grandmother   . Heart attack Paternal Grandmother   . Heart disease Paternal Grandfather     Social History Social History   Tobacco Use  . Smoking status: Never Smoker  . Smokeless tobacco: Never Used  Substance Use Topics  . Alcohol use: Never    Frequency: Never  . Drug use: Not Currently   Allergies Shellfish allergy, Bactrim [sulfamethoxazole-trimethoprim], and Iodine  Review of Systems Review of Systems All other systems are reviewed and are negative for acute change except as noted in the HPI  Physical Exam Vital Signs  I have reviewed the triage vital signs BP (!) 182/80   Pulse 76   Temp 98.7 F (37.1 C) (Oral)   Resp 20   Ht 5\' 11"  (1.803 m)   Wt 113.4 kg   SpO2 97%   BMI 34.87 kg/m   Physical Exam Vitals signs reviewed.  Constitutional:      General: He is not in acute distress.    Appearance: He is well-developed. He is not diaphoretic.  HENT:     Head: Normocephalic and atraumatic.     Jaw: No trismus.     Right Ear: External ear normal.     Left Ear: External ear normal.     Nose: Nose normal.  Eyes:     General: No scleral icterus.    Conjunctiva/sclera: Conjunctivae normal.  Neck:     Musculoskeletal: Normal range of motion.     Trachea: Phonation normal.  Cardiovascular:     Rate and  Rhythm: Normal rate and regular rhythm.     Pulses:          Posterior tibial pulses are 1+ on the right side and 1+ on the left side.  Pulmonary:     Effort: Pulmonary effort is normal. No respiratory distress.     Breath sounds: No stridor.  Abdominal:     General: There is no distension.  Musculoskeletal: Normal range of motion.     Lumbar back: He exhibits tenderness and spasm.       Back:  Neurological:     Mental Status: He is alert and oriented to person, place, and time.  Psychiatric:        Behavior: Behavior normal.     ED Results and Treatments Labs (all labs ordered are listed, but only abnormal results are displayed) Labs Reviewed - No data to display                                                                                                                       EKG  EKG Interpretation  Date/Time:    Ventricular Rate:    PR Interval:    QRS Duration:   QT Interval:    QTC Calculation:   R Axis:     Text Interpretation:        Radiology Dg Hip Unilat W Or Wo Pelvis 2-3 Views Right  Result Date: 12/17/2018 CLINICAL DATA:  Generalized hip pain. EXAM: DG HIP (WITH OR WITHOUT PELVIS) 2-3V RIGHT COMPARISON:  None. FINDINGS: There is no evidence of hip fracture or dislocation. There is no evidence of arthropathy or other focal bone abnormality. IMPRESSION: Negative. Electronically Signed   By: Rolm Baptise  M.D.   On: 12/17/2018 22:24    Pertinent labs & imaging results that were available during my care of the patient were reviewed by me and considered in my medical decision making (see chart for details).  Medications Ordered in ED Medications  HYDROcodone-acetaminophen (NORCO/VICODIN) 5-325 MG per tablet 1 tablet (1 tablet Oral Given 12/18/18 0048)  dexamethasone (DECADRON) injection 10 mg (10 mg Intramuscular Given 12/18/18 0048)                                                                                                                                     Procedures Procedures  (including critical care time)  Medical Decision Making / ED Course I have reviewed the nursing notes for this encounter and the patient's prior records (if available in EHR or on provided paperwork).   Dylan Zimmerman was evaluated in Emergency Department on 12/18/2018 for the symptoms described in the history of present illness. He was evaluated in the context of the global COVID-19 pandemic, which necessitated consideration that the patient might be at risk for infection with the SARS-CoV-2 virus that causes COVID-19. Institutional protocols and algorithms that pertain to the evaluation of patients at risk for COVID-19 are in a state of rapid change based on information released by regulatory bodies including the CDC and federal and state organizations. These policies and algorithms were followed during the patient's care in the ED.  Patient presents with 1 week of right buttock pain.  Worse with movement and palpation.  No trauma.  No red flags suspicious for cauda equina.  Likely muscular -possibly piriformis.  Able to isolate with figure 4 stretching.   Plain film of the right hip by triage was negative.   Provided with oral dose of Vicodin in the emergency department.  Given a shot of Decadron.  Robaxin prescription.  Recommended PCP follow-up for further management.      Final Clinical Impression(s) / ED Diagnoses Final diagnoses:  Gluteal pain     The patient appears reasonably screened and/or stabilized for discharge and I doubt any other medical condition or other [facility] requiring further screening, evaluation, or treatment in the ED at this time prior to discharge.  Disposition: Discharge  Condition: Good  I have discussed the results, Dx and Tx plan with the patient who expressed understanding and agree(s) with the plan. Discharge instructions discussed at great length. The patient was given strict return precautions who verbalized understanding of the  instructions. No further questions at time of discharge.    ED Discharge Orders         Ordered    methocarbamol (ROBAXIN) 500 MG tablet  2 times daily     12/18/18 0044           Follow Up: Lujean Amel, MD [place] [id number] [phone]  Schedule an appointment as soon as possible for a visit  As needed  This chart was dictated using voice recognition software.  Despite best efforts to proofread,  errors can occur which can change the documentation meaning.   Fatima Blank, MD 12/18/18 [phone]

## 2018-12-24 NOTE — Progress Notes (Signed)
Cardiology Office Note:    Date:  12/25/2018   ID:  Dylan Zimmerman, DOB 1941/03/14, MRN [id number]  PCP:  Lujean Amel, MD  Cardiologist:  Shirlee More, MD    Referring MD: Lujean Amel, MD    ASSESSMENT:    1. Syncope and collapse   2. Essential hypertension   3. PVC's (premature ventricular contractions)    PLAN:    In order of problems listed above:  1. He has had no recurrent syncope I cautioned him that on average it takes about 6 months to find an event and people know me place an ILR for now will avoid excessive treatment of hypertension except systolics in the range of 140 and continue his thiazide diuretic.  His PVCs are asymptomatic BP at target we will follow him in our device clinic and I will see back in the office in 6 months or sooner if we document a significant arrhythmia.  Strongly encouraged him to follow-up with his PCP today with his severe low back and radicular pain which so far is unimproved with conservative measures.   Next appointment: 6 months   Medication Adjustments/Labs and Tests Ordered: Current medicines are reviewed at length with the patient today.  Concerns regarding medicines are outlined above.  No orders of the defined types were placed in this encounter.  No orders of the defined types were placed in this encounter.   Chief Complaint  Patient presents with  . Follow-up    after ILR  . Loss of Consciousness  . Hypertension    History of Present Illness:    Dylan Zimmerman is a 78 y.o. male with a hx of syncope, PVCs and left bundle branch block  last seen 09/24/2018 and referred for an ILR.  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  Hologic myocardial perfusion study showed an ejection fraction normal 64% and no ischemia.  A 2-week ZIO monitor showed rare PVCs no bradycardia arrhythmia. Compliance with diet, lifestyle and medications: Yes   Off ACE inhibitor has had no recurrent episodes of lightheadedness and home blood pressure runs in the range of [id number] systolic.  Unfortunately he has severe low back and radicular pain and is not improving with conservative methods.  He will have his first download of ILR in 2 weeks. Past Medical History:  Diagnosis Date  . Essential hypertension 06/18/2018  . Hyperlipidemia   . Hypertension   . Obesity 06/18/2018  . Palpitations 06/18/2018  . Syncope and collapse     Past Surgical History:  Procedure Laterality Date  . CATARACT EXTRACTION Bilateral   . LITHOTRIPSY    . LOOP RECORDER INSERTION N/A 10/04/2018   Procedure: LOOP RECORDER INSERTION;  Surgeon: Constance Haw, MD;  Location: [facility];  Service: Cardiovascular;  Laterality: N/A;  . TONSILLECTOMY      Current Medications: Current Meds  Medication Sig  . atorvastatin (LIPITOR) 10 MG tablet Take 10 mg by mouth daily.  . Diclofenac Sodium (VOLTAREN PO) Take by mouth.  . hydrochlorothiazide (HYDRODIURIL) 25 MG tablet Take 1 tablet (25 mg total) by mouth daily.  . methocarbamol (ROBAXIN) 500 MG tablet Take 1 tablet (500 mg total) by mouth 2 (two) times daily.  . Multiple Vitamin (MULTIVITAMIN) tablet Take 1 tablet by mouth daily.  . sildenafil (REVATIO) 20 MG tablet Take 1 tablet by mouth as needed.  . traMADol (ULTRAM) 50 MG tablet TAKE 1 TABLET BY  MOUTH THREE TIMES A DAY AS NEEDED FOR 5 DAYS  . zolpidem (AMBIEN) 10 MG tablet Take 10 mg by mouth at bedtime as needed for sleep.      Allergies:   Shellfish allergy, Bactrim [sulfamethoxazole-trimethoprim], and Iodine   Social History   Socioeconomic History  . Marital status: Married    Spouse name: Not on file  . Number of children: Not on file  . Years of education: Not on file  . Highest education level: Not on file  Occupational History  . Not on file  Social Needs  . Financial resource strain: Not on file  . Food insecurity    Worry: Not on file     Inability: Not on file  . Transportation needs    Medical: Not on file    Non-medical: Not on file  Tobacco Use  . Smoking status: Never Smoker  . Smokeless tobacco: Never Used  Substance and Sexual Activity  . Alcohol use: Never    Frequency: Never  . Drug use: Never  . Sexual activity: Not on file  Lifestyle  . Physical activity    Days per week: Not on file    Minutes per session: Not on file  . Stress: Not on file  Relationships  . Social Herbalist on phone: Not on file    Gets together: Not on file    Attends religious service: Not on file    Active member of club or organization: Not on file    Attends meetings of clubs or organizations: Not on file    Relationship status: Not on file  Other Topics Concern  . Not on file  Social History Narrative  . Not on file     Family History: The patient's family history includes Cancer in his father and mother; Heart attack in his maternal grandfather and paternal grandmother; Heart disease in his paternal grandfather and paternal grandmother. ROS:   Please see the history of present illness.    All other systems reviewed and are negative.  EKGs/Labs/Other Studies Reviewed:    The following studies were reviewed today:   Recent Labs: 09/24/2018: ALT 19; BUN 19; Creatinine, Ser 1.05; Potassium 4.4; Sodium 142  Recent Lipid Panel    Component Value Date/Time   CHOL 149 10/01/2018 1016   TRIG 263 (H) 10/01/2018 1016   HDL 35 (L) 10/01/2018 1016   CHOLHDL 4.3 10/01/2018 1016   LDLCALC 61 10/01/2018 1016    Physical Exam:    VS:  BP (!) 120/58 (BP Location: Left Arm, Patient Position: Sitting, Cuff Size: Large)   Pulse 72   Ht 5\' 11"  (1.803 m)   Wt 242 lb 12.8 oz (110.1 kg)   SpO2 98%   BMI 33.86 kg/m     Wt Readings from Last 3 Encounters:  12/25/18 242 lb 12.8 oz (110.1 kg)  12/17/18 250 lb (113.4 kg)  10/04/18 245 lb (111.1 kg)     GEN:  Well nourished, well developed in no acute distress  HEENT: Normal NECK: No JVD; No carotid bruits LYMPHATICS: No lymphadenopathy CARDIAC: RRR, no murmurs, rubs, gallops RESPIRATORY:  Clear to auscultation without rales, wheezing or rhonchi  ABDOMEN: Soft, non-tender, non-distended MUSCULOSKELETAL:  No edema; No deformity  SKIN: Warm and dry NEUROLOGIC:  Alert and oriented x 3 PSYCHIATRIC:  Normal affect    Signed, Shirlee More, MD  12/25/2018 9:37 AM    [place]

## 2018-12-25 ENCOUNTER — Other Ambulatory Visit: Payer: Self-pay

## 2018-12-25 ENCOUNTER — Encounter: Payer: Self-pay | Admitting: Cardiology

## 2018-12-25 ENCOUNTER — Ambulatory Visit (INDEPENDENT_AMBULATORY_CARE_PROVIDER_SITE_OTHER): Payer: Medicare Other | Admitting: Cardiology

## 2018-12-25 VITALS — BP 120/58 | HR 72 | Ht 71.0 in | Wt 242.8 lb

## 2018-12-25 DIAGNOSIS — I10 Essential (primary) hypertension: Secondary | ICD-10-CM

## 2018-12-25 DIAGNOSIS — I493 Ventricular premature depolarization: Secondary | ICD-10-CM | POA: Diagnosis not present

## 2018-12-25 DIAGNOSIS — R55 Syncope and collapse: Secondary | ICD-10-CM

## 2018-12-25 DIAGNOSIS — M5431 Sciatica, right side: Secondary | ICD-10-CM | POA: Diagnosis not present

## 2018-12-25 MED ORDER — HYDROCHLOROTHIAZIDE 25 MG PO TABS: 25.0000 mg | ORAL_TABLET | Freq: Every day | ORAL | 1 refills | Status: DC

## 2018-12-25 NOTE — Patient Instructions (Signed)

## 2018-12-25 NOTE — Addendum Note (Signed)
Addended by: Austin Miles on: 12/25/2018 09:43 AM   Modules accepted: Orders

## 2018-12-30 DIAGNOSIS — M25551 Pain in right hip: Secondary | ICD-10-CM | POA: Diagnosis not present

## 2018-12-30 DIAGNOSIS — M545 Low back pain: Secondary | ICD-10-CM | POA: Diagnosis not present

## 2019-01-01 DIAGNOSIS — M25551 Pain in right hip: Secondary | ICD-10-CM | POA: Diagnosis not present

## 2019-01-01 DIAGNOSIS — M545 Low back pain: Secondary | ICD-10-CM | POA: Diagnosis not present

## 2019-01-02 DIAGNOSIS — M545 Low back pain: Secondary | ICD-10-CM | POA: Diagnosis not present

## 2019-01-02 DIAGNOSIS — M48061 Spinal stenosis, lumbar region without neurogenic claudication: Secondary | ICD-10-CM | POA: Diagnosis not present

## 2019-01-02 DIAGNOSIS — M47896 Other spondylosis, lumbar region: Secondary | ICD-10-CM | POA: Diagnosis not present

## 2019-01-02 DIAGNOSIS — M25551 Pain in right hip: Secondary | ICD-10-CM | POA: Diagnosis not present

## 2019-01-03 ENCOUNTER — Ambulatory Visit (INDEPENDENT_AMBULATORY_CARE_PROVIDER_SITE_OTHER): Payer: Medicare Other | Admitting: *Deleted

## 2019-01-03 DIAGNOSIS — R55 Syncope and collapse: Secondary | ICD-10-CM

## 2019-01-03 DIAGNOSIS — R002 Palpitations: Secondary | ICD-10-CM

## 2019-01-03 LAB — CUP PACEART REMOTE DEVICE CHECK
Date Time Interrogation Session: 20201009060251
Implantable Pulse Generator Implant Date: 20200710
Pulse Gen Serial Number: 9515998

## 2019-01-07 ENCOUNTER — Encounter: Payer: Medicare Other | Admitting: Cardiology

## 2019-01-15 DIAGNOSIS — R140 Abdominal distension (gaseous): Secondary | ICD-10-CM | POA: Diagnosis not present

## 2019-01-15 NOTE — Progress Notes (Signed)
ILR

## 2019-01-25 DIAGNOSIS — M5136 Other intervertebral disc degeneration, lumbar region: Secondary | ICD-10-CM | POA: Diagnosis not present

## 2019-01-28 DIAGNOSIS — M48062 Spinal stenosis, lumbar region with neurogenic claudication: Secondary | ICD-10-CM | POA: Diagnosis not present

## 2019-01-28 DIAGNOSIS — M545 Low back pain: Secondary | ICD-10-CM | POA: Diagnosis not present

## 2019-01-28 DIAGNOSIS — M5416 Radiculopathy, lumbar region: Secondary | ICD-10-CM | POA: Diagnosis not present

## 2019-01-29 DIAGNOSIS — R7401 Elevation of levels of liver transaminase levels: Secondary | ICD-10-CM | POA: Diagnosis not present

## 2019-01-31 ENCOUNTER — Other Ambulatory Visit: Payer: Self-pay | Admitting: Family Medicine

## 2019-01-31 DIAGNOSIS — R1011 Right upper quadrant pain: Secondary | ICD-10-CM

## 2019-02-03 ENCOUNTER — Other Ambulatory Visit: Payer: Self-pay

## 2019-02-03 ENCOUNTER — Ambulatory Visit (INDEPENDENT_AMBULATORY_CARE_PROVIDER_SITE_OTHER): Payer: Medicare Other | Admitting: Student

## 2019-02-03 VITALS — BP 148/66 | HR 72 | Ht 71.0 in | Wt 237.0 lb

## 2019-02-03 DIAGNOSIS — R55 Syncope and collapse: Secondary | ICD-10-CM | POA: Diagnosis not present

## 2019-02-03 DIAGNOSIS — R002 Palpitations: Secondary | ICD-10-CM | POA: Diagnosis not present

## 2019-02-03 LAB — CUP PACEART INCLINIC DEVICE CHECK
Date Time Interrogation Session: 20201109102729
Implantable Pulse Generator Implant Date: 20200710
Pulse Gen Serial Number: 9515998

## 2019-02-03 NOTE — Progress Notes (Signed)
PCP:  Lujean Amel, MD Primary Cardiologist: Shirlee More, MD Electrophysiologist: Dr. Ernestene Dylan Zimmerman is a 78 y.o. male with past medical history of syncope who presents today for routine electrophysiology followup. They are seen for Dr. Curt Bears.   Since last being seen in our clinic, the patient reports doing very well.  He denies any further lightheadedness or dizziness. Has had no further episodes of syncope since May.   The patient feels that he is tolerating medications without difficulties and is otherwise without complaint today.   DEVICE HISTORY - [employer] Jude ILR implanted 10/04/2018 for h/o syncope.   Past Medical History:  Diagnosis Date  . Essential hypertension 06/18/2018  . Hyperlipidemia   . Hypertension   . Obesity 06/18/2018  . Palpitations 06/18/2018  . Syncope and collapse    Past Surgical History:  Procedure Laterality Date  . CATARACT EXTRACTION Bilateral   . LITHOTRIPSY    . LOOP RECORDER INSERTION N/A 10/04/2018   Procedure: LOOP RECORDER INSERTION;  Surgeon: Constance Haw, MD;  Location: [facility];  Service: Cardiovascular;  Laterality: N/A;  . TONSILLECTOMY      Current Outpatient Medications  Medication Sig Dispense Refill  . atorvastatin (LIPITOR) 10 MG tablet Take 10 mg by mouth daily.    Marland Kitchen gabapentin (NEURONTIN) 300 MG capsule Take 300 mg by mouth 2 (two) times daily.    . hydrochlorothiazide (HYDRODIURIL) 25 MG tablet Take 1 tablet (25 mg total) by mouth daily. 90 tablet 1  . HYDROcodone-acetaminophen (NORCO/VICODIN) 5-325 MG tablet Take 325 tablets by mouth as needed.    . methocarbamol (ROBAXIN) 500 MG tablet Take 1 tablet (500 mg total) by mouth 2 (two) times daily. 20 tablet 0  . Multiple Vitamin (MULTIVITAMIN) tablet Take 1 tablet by mouth daily.    . sildenafil (REVATIO) 20 MG tablet Take 1 tablet by mouth as needed.    . zolpidem (AMBIEN) 10 MG tablet Take 10 mg by mouth at bedtime as needed for sleep.      No current  facility-administered medications for this visit.     Allergies  Allergen Reactions  . Shellfish Allergy Anaphylaxis  . Bactrim [Sulfamethoxazole-Trimethoprim] Nausea And Vomiting  . Iodine     Social History   Socioeconomic History  . Marital status: Married    Spouse name: Not on file  . Number of children: Not on file  . Years of education: Not on file  . Highest education level: Not on file  Occupational History  . Not on file  Social Needs  . Financial resource strain: Not on file  . Food insecurity    Worry: Not on file    Inability: Not on file  . Transportation needs    Medical: Not on file    Non-medical: Not on file  Tobacco Use  . Smoking status: Never Smoker  . Smokeless tobacco: Never Used  Substance and Sexual Activity  . Alcohol use: Never    Frequency: Never  . Drug use: Never  . Sexual activity: Not on file  Lifestyle  . Physical activity    Days per week: Not on file    Minutes per session: Not on file  . Stress: Not on file  Relationships  . Social Herbalist on phone: Not on file    Gets together: Not on file    Attends religious service: Not on file    Active member of club or organization: Not on file  Attends meetings of clubs or organizations: Not on file    Relationship status: Not on file  . Intimate partner violence    Fear of current or ex partner: Not on file    Emotionally abused: Not on file    Physically abused: Not on file    Forced sexual activity: Not on file  Other Topics Concern  . Not on file  Social History Narrative  . Not on file     Review of Systems: General: No chills, fever, night sweats or weight changes  Cardiovascular:  No chest pain, dyspnea on exertion, edema, orthopnea, palpitations, paroxysmal nocturnal dyspnea Dermatological: No rash, lesions or masses Respiratory: No cough, dyspnea Urologic: No hematuria, dysuria Abdominal: No nausea, vomiting, diarrhea, bright red blood per rectum,  melena, or hematemesis Neurologic: No visual changes, weakness, changes in mental status All other systems reviewed and are otherwise negative except as noted above.  Physical Exam: Vitals:   02/03/19 1005  Weight: 237 lb (107.5 kg)  Height: 5\' 11"  (1.803 m)    GEN- The patient is elderly appearing, alert and oriented x 3 today.   HEENT: normocephalic, atraumatic; sclera clear, conjunctiva pink; hearing intact; oropharynx clear; neck supple, no JVP Lymph- no cervical lymphadenopathy Lungs- Clear to ausculation bilaterally, normal work of breathing.  No wheezes, rales, rhonchi Heart- Regular rate and rhythm, no murmurs, rubs or gallops, PMI not laterally displaced GI- soft, non-tender, non-distended, bowel sounds present, no hepatosplenomegaly Extremities- no clubbing, cyanosis, or edema; DP/PT/radial pulses 2+ bilaterally MS- no significant deformity or atrophy Skin- warm and dry, no rash or lesion Psych- euthymic mood, full affect Neuro- strength and sensation are intact  EKG is not ordered today. ILR reviewed.  Assessment and Plan:  1. Syncope - ILR reviewed today - h/o normal myoview - Echo 09/04/2018 showed LVEF 60-65%.   2. HTN - Continue current regimen  3. PVCs - Low burden. No changes.   Continue to follow ILR. RTC 6 months. Sooner with symptoms.   Shirley Friar, PA-C  02/03/19 10:09 AM

## 2019-02-03 NOTE — Patient Instructions (Signed)
Medication Instructions:   Your physician recommends that you continue on your current medications as directed. Please refer to the Current Medication list given to you today.  *If you need a refill on your cardiac medications before your next appointment, please call your pharmacy*  Lab Work: [place]   If you have labs (blood work) drawn today and your tests are completely normal, you will receive your results only by: Marland Kitchen MyChart Message (if you have MyChart) OR . A paper copy in the mail If you have any lab test that is abnormal or we need to change your treatment, we will call you to review the results.  Testing/Procedures: NONE ORDERED  TODAY   Follow-Up: At [facility], you and your health needs are our priority.  As part of our continuing mission to provide you with exceptional heart care, we have created designated Provider Care Teams.  These Care Teams include your primary Cardiologist (physician) and Advanced Practice Providers (APPs -  Physician Assistants and Nurse Practitioners) who all work together to provide you with the care you need, when you need it.  Your next appointment:   6 months  The format for your next appointment:   In Person  Provider:   You may see  Dr. Curt Bears or one of the following Advanced Practice Providers on your designated Care Team:    Chanetta Marshall, NP  Tommye Standard, PA-C  Legrand Como "Oda Kilts, [place]   Other Instructions

## 2019-02-05 ENCOUNTER — Ambulatory Visit (INDEPENDENT_AMBULATORY_CARE_PROVIDER_SITE_OTHER): Payer: Medicare Other | Admitting: *Deleted

## 2019-02-05 DIAGNOSIS — R55 Syncope and collapse: Secondary | ICD-10-CM

## 2019-02-05 LAB — CUP PACEART REMOTE DEVICE CHECK
Date Time Interrogation Session: 20201111070424
Implantable Pulse Generator Implant Date: 20200710
Pulse Gen Serial Number: 9515998

## 2019-02-06 ENCOUNTER — Ambulatory Visit
Admission: RE | Admit: 2019-02-06 | Discharge: 2019-02-06 | Disposition: A | Discharge status: Home / Self Care | Payer: Medicare Other | Source: Ambulatory Visit | Attending: Family Medicine | Admitting: Family Medicine

## 2019-02-06 DIAGNOSIS — R1011 Right upper quadrant pain: Secondary | ICD-10-CM

## 2019-02-06 DIAGNOSIS — K8020 Calculus of gallbladder without cholecystitis without obstruction: Secondary | ICD-10-CM | POA: Diagnosis not present

## 2019-02-10 DIAGNOSIS — M5416 Radiculopathy, lumbar region: Secondary | ICD-10-CM | POA: Diagnosis not present

## 2019-02-12 DIAGNOSIS — M5431 Sciatica, right side: Secondary | ICD-10-CM | POA: Diagnosis not present

## 2019-02-12 DIAGNOSIS — K8020 Calculus of gallbladder without cholecystitis without obstruction: Secondary | ICD-10-CM | POA: Diagnosis not present

## 2019-02-12 DIAGNOSIS — G4700 Insomnia, unspecified: Secondary | ICD-10-CM | POA: Diagnosis not present

## 2019-02-27 NOTE — Progress Notes (Signed)
ILR

## 2019-03-07 ENCOUNTER — Ambulatory Visit (INDEPENDENT_AMBULATORY_CARE_PROVIDER_SITE_OTHER): Payer: Medicare Other | Admitting: *Deleted

## 2019-03-07 DIAGNOSIS — R002 Palpitations: Secondary | ICD-10-CM

## 2019-03-10 LAB — CUP PACEART REMOTE DEVICE CHECK
Date Time Interrogation Session: 20201212020542
Implantable Pulse Generator Implant Date: 20200710
Pulse Gen Serial Number: 9515998

## 2019-04-08 ENCOUNTER — Ambulatory Visit: Payer: Self-pay | Admitting: Surgery

## 2019-04-14 ENCOUNTER — Encounter (HOSPITAL_BASED_OUTPATIENT_CLINIC_OR_DEPARTMENT_OTHER): Payer: Self-pay | Admitting: Surgery

## 2019-04-14 ENCOUNTER — Other Ambulatory Visit: Payer: Self-pay

## 2019-04-17 ENCOUNTER — Encounter (HOSPITAL_BASED_OUTPATIENT_CLINIC_OR_DEPARTMENT_OTHER)
Admission: RE | Admit: 2019-04-17 | Discharge: 2019-04-17 | Disposition: A | Discharge status: Home / Self Care | Payer: Medicare PPO | Source: Ambulatory Visit | Attending: Surgery | Admitting: Surgery

## 2019-04-17 ENCOUNTER — Other Ambulatory Visit: Payer: Self-pay

## 2019-04-17 ENCOUNTER — Other Ambulatory Visit (HOSPITAL_COMMUNITY)
Admission: RE | Admit: 2019-04-17 | Discharge: 2019-04-17 | Disposition: A | Discharge status: Home / Self Care | Payer: Medicare PPO | Source: Ambulatory Visit | Attending: Surgery | Admitting: Surgery

## 2019-04-17 DIAGNOSIS — K660 Peritoneal adhesions (postprocedural) (postinfection): Secondary | ICD-10-CM | POA: Diagnosis not present

## 2019-04-17 DIAGNOSIS — K8010 Calculus of gallbladder with chronic cholecystitis without obstruction: Secondary | ICD-10-CM | POA: Diagnosis present

## 2019-04-17 DIAGNOSIS — Z01812 Encounter for preprocedural laboratory examination: Secondary | ICD-10-CM | POA: Diagnosis present

## 2019-04-17 DIAGNOSIS — Z20822 Contact with and (suspected) exposure to covid-19: Secondary | ICD-10-CM | POA: Insufficient documentation

## 2019-04-17 DIAGNOSIS — Z91041 Radiographic dye allergy status: Secondary | ICD-10-CM | POA: Diagnosis not present

## 2019-04-17 DIAGNOSIS — Z950 Presence of cardiac pacemaker: Secondary | ICD-10-CM | POA: Diagnosis not present

## 2019-04-17 DIAGNOSIS — Z881 Allergy status to other antibiotic agents status: Secondary | ICD-10-CM | POA: Diagnosis not present

## 2019-04-17 DIAGNOSIS — Z91013 Allergy to seafood: Secondary | ICD-10-CM | POA: Diagnosis not present

## 2019-04-17 DIAGNOSIS — Z888 Allergy status to other drugs, medicaments and biological substances status: Secondary | ICD-10-CM | POA: Diagnosis not present

## 2019-04-17 DIAGNOSIS — R7989 Other specified abnormal findings of blood chemistry: Secondary | ICD-10-CM | POA: Insufficient documentation

## 2019-04-17 DIAGNOSIS — E7800 Pure hypercholesterolemia, unspecified: Secondary | ICD-10-CM | POA: Diagnosis not present

## 2019-04-17 DIAGNOSIS — I10 Essential (primary) hypertension: Secondary | ICD-10-CM | POA: Diagnosis not present

## 2019-04-17 DIAGNOSIS — K811 Chronic cholecystitis: Secondary | ICD-10-CM | POA: Diagnosis not present

## 2019-04-17 DIAGNOSIS — Z79899 Other long term (current) drug therapy: Secondary | ICD-10-CM | POA: Diagnosis not present

## 2019-04-17 DIAGNOSIS — Z7982 Long term (current) use of aspirin: Secondary | ICD-10-CM | POA: Diagnosis not present

## 2019-04-17 LAB — BASIC METABOLIC PANEL
Anion gap: 10 (ref 5–15)
BUN: 15 mg/dL (ref 8–23)
CO2: 29 mmol/L (ref 22–32)
Calcium: 9.2 mg/dL (ref 8.9–10.3)
Chloride: 101 mmol/L (ref 98–111)
Creatinine, Ser: 1.06 mg/dL (ref 0.61–1.24)
GFR calc Af Amer: >60 mL/min (ref >60)
GFR calc non Af Amer: >60 mL/min (ref >60)
Glucose, Bld: 166 mg/dL — ABNORMAL HIGH (ref 70–99)
Potassium: 4.3 mmol/L (ref 3.5–5.1)
Sodium: 140 mmol/L (ref 135–145)

## 2019-04-17 LAB — SARS CORONAVIRUS 2 (TAT 6-24 HRS): SARS Coronavirus 2: NEGATIVE

## 2019-04-17 MED ORDER — CHLORHEXIDINE GLUCONATE CLOTH 2 % EX PADS: 6.0000 | MEDICATED_PAD | Freq: Once | CUTANEOUS | Status: DC

## 2019-04-20 ENCOUNTER — Encounter (HOSPITAL_BASED_OUTPATIENT_CLINIC_OR_DEPARTMENT_OTHER): Payer: Self-pay | Admitting: Surgery

## 2019-04-20 DIAGNOSIS — K8010 Calculus of gallbladder with chronic cholecystitis without obstruction: Secondary | ICD-10-CM | POA: Diagnosis present

## 2019-04-20 DIAGNOSIS — R748 Abnormal levels of other serum enzymes: Secondary | ICD-10-CM

## 2019-04-20 HISTORY — DX: Abnormal levels of other serum enzymes: R74.8

## 2019-04-20 HISTORY — DX: Calculus of gallbladder with chronic cholecystitis without obstruction: K80.10

## 2019-04-20 NOTE — H&P (Signed)
General Surgery [facility]  Dylan Zimmerman DOB: [birth year] Married / Language: English / Race: White Male   History of Present Illness  The patient is a 79 year old male who presents for evaluation of gall stones.  CHIEF COMPLAINT: symptomatic cholelithiasis, elevated LFT's  Patient is a 79 year old male referred by his primary care physician, Dr. Lujean Amel, for surgical evaluation and management of symptomatic cholelithiasis and a history of elevated liver enzymes. Patient had an episode of abdominal pain in October 2020 which was severe and radiated to the shoulder. Patient was seen by his primary physician and laboratory studies showed elevated liver enzymes. Ultrasound was performed on February 06, 2019. This showed multiple gallstones with sludge in the gallbladder. There was no wall thickening. Common bile duct was normal at 4 mm. Repeat liver enzymes were normal. Patient denies any history of jaundice or acholic stools. He has had no prior abdominal surgery. He has no history of hepatobiliary or pancreatic disease. There is a family history of gallbladder disease in both of his daughters and possibly his mother. Patient presents today to discuss cholecystectomy.  Past Surgical History Cataract Surgery  Bilateral. Colon Polyp Removal - Colonoscopy  Tonsillectomy   Diagnostic Studies History Colonoscopy  1-5 years ago  Allergies Iodine (Antiseptic) *ANTISEPTICS & DISINFECTANTS*  Bactrim *ANTI-INFECTIVE AGENTS - MISC.*  Shellfish  Iodinated Contrast Media   Medication History Zolpidem Tartrate (10MG  Tablet, Oral as needed) Active. Atorvastatin Calcium (10MG  Tablet, Oral) Active. Gabapentin (300MG  Capsule, Oral two times daily) Active. hydroCHLOROthiazide (25MG  Tablet, Oral) Active. Sildenafil Citrate (20MG  Tablet, Oral as needed) Active. Aspirin (81MG  Tablet, Oral) Active. Centrum Silver (Oral) Active. Glucosamine (500MG   Capsule, Oral) Active. Vitamin C (250MG  Tablet, Oral) Active. Vitamin B Complex (Oral) Active. Vitamin D (Cholecalciferol) (10 MCG(400 UNIT) Capsule, Oral) Active. Fish Oil (1000MG  Capsule, Oral) Active. Medications Reconciled  Social History Caffeine use  Carbonated beverages, Tea. No alcohol use  No drug use  Tobacco use  Never smoker.  Family History Colon Cancer  Father, Mother.  Other Problems Back Pain  Cholelithiasis  High blood pressure  Hypercholesterolemia  Kidney Stone  Other disease, cancer, significant illness   Review of Systems General Not Present- Appetite Loss, Chills, Fatigue, Fever, Night Sweats, Weight Gain and Weight Loss. Skin Not Present- Change in Wart/Mole, Dryness, Hives, Jaundice, New Lesions, Non-Healing Wounds, Rash and Ulcer. HEENT Present- Wears glasses/contact lenses. Not Present- Earache, Hearing Loss, Hoarseness, Nose Bleed, Oral Ulcers, Ringing in the Ears, Seasonal Allergies, Sinus Pain, Sore Throat, Visual Disturbances and Yellow Eyes. Respiratory Present- Snoring. Not Present- Bloody sputum, Chronic Cough, Difficulty Breathing and Wheezing. Breast Not Present- Breast Mass, Breast Pain, Nipple Discharge and Skin Changes. Cardiovascular Not Present- Chest Pain, Difficulty Breathing Lying Down, Leg Cramps, Palpitations, Rapid Heart Rate, Shortness of Breath and Swelling of Extremities. Gastrointestinal Present- Abdominal Pain. Not Present- Bloating, Bloody Stool, Change in Bowel Habits, Chronic diarrhea, Constipation, Difficulty Swallowing, Excessive gas, Gets full quickly at meals, Hemorrhoids, Indigestion, Nausea, Rectal Pain and Vomiting. Male Genitourinary Not Present- Blood in Urine, Change in Urinary Stream, Frequency, Impotence, Nocturia, Painful Urination, Urgency and Urine Leakage. Musculoskeletal Present- Back Pain and Joint Pain. Not Present- Joint Stiffness, Muscle Pain, Muscle Weakness and Swelling of  Extremities. Neurological Not Present- Decreased Memory, Fainting, Headaches, Numbness, Seizures, Tingling, Tremor, Trouble walking and Weakness. Psychiatric Not Present- Anxiety, Bipolar, Change in Sleep Pattern, Depression, Fearful and Frequent crying. Endocrine Not Present- Cold Intolerance, Excessive Hunger, Hair Changes, Heat Intolerance, Hot flashes and  New Diabetes. Hematology Not Present- Blood Thinners, Easy Bruising, Excessive bleeding, Gland problems, HIV and Persistent Infections.  Vitals Weight: 249 lb Height: 71in Body Surface Area: 2.31 m Body Mass Index: 34.73 kg/m  Temp.: 98.69F(Thermal Scan)  Pulse: 91 (Regular)  BP: 144/84 (Sitting, Left Arm, Standard)  Physical Exam  GENERAL APPEARANCE Development: normal Nutritional status: normal Gross deformities: none  SKIN Rash, lesions, ulcers: none Induration, erythema: none Nodules: none palpable  EYES Conjunctiva and lids: normal Pupils: equal and reactive Iris: normal bilaterally  EARS, NOSE, MOUTH, THROAT External ears: no lesion or deformity External nose: no lesion or deformity Hearing: grossly normal Patient is wearing a mask.  NECK Symmetric: yes Trachea: midline Thyroid: no palpable nodules in the thyroid bed  CHEST Respiratory effort: normal Retraction or accessory muscle use: no Breath sounds: normal bilaterally Rales, rhonchi, wheeze: none  CARDIOVASCULAR Auscultation: regular rhythm, normal rate Murmurs: none Pulses: carotid and radial pulse 2+ palpable Lower extremity edema: none Lower extremity varicosities: none  ABDOMEN Distension: none Masses: none palpable Tenderness: none Hepatosplenomegaly: not present Hernia: not present  MUSCULOSKELETAL Station and gait: normal Digits and nails: no clubbing or cyanosis Muscle strength: grossly normal all extremities Range of motion: grossly normal all extremities Deformity: none  LYMPHATIC Cervical: none  palpable Supraclavicular: none palpable  PSYCHIATRIC Oriented to person, place, and time: yes Mood and affect: normal for situation Judgment and insight: appropriate for situation    Assessment & Plan  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  Patient is referred by his primary care physician for surgical management of symptomatic cholelithiasis and a history of elevated liver enzymes. Patient is provided with written literature on gallbladder surgery to review at home.  Patient has had an episode of severe biliary colic in October [id number]. He had a transient elevation of his liver enzymes consistent with choledocholithiasis. Patient has been relatively asymptomatic since that time. We discussed laparoscopic cholecystectomy with intraoperative cholangiography. We discussed the location of the surgical incisions. We discussed the hospital stay to be anticipated. We discussed the postoperative recovery and return to normal activity. We discussed potential complications if the patient did not undergo cholecystectomy. The patient understands and wishes to proceed with surgery in the near future.  We will schedule the patient for surgery as the hospital operating rooms allow, given the current Covid-19 environment. Given the patient's age, he may require an overnight hospital stay following the procedure. We will try to make arrangements for this at the outpatient surgical Center affiliated with the hospital.  The risks and benefits of the procedure have been discussed at length with the patient. The patient understands the proposed procedure, potential alternative treatments, and the course of recovery to be expected. All of the patient's questions have been answered at this time. The patient wishes to proceed with surgery.  Armandina Gemma, MD [facility] Office: [phone]

## 2019-04-20 NOTE — Anesthesia Preprocedure Evaluation (Addendum)
Anesthesia Evaluation  Patient identified by MRN, date of birth, ID band Patient awake    Reviewed: Allergy & Precautions, NPO status , Patient's Chart, lab work & pertinent test results  Airway Mallampati: II  TM Distance: >3 FB Neck ROM: Full    Dental no notable dental hx. (+) Teeth Intact, Dental Advisory Given   Pulmonary neg pulmonary ROS,    Pulmonary exam normal breath sounds clear to auscultation       Cardiovascular hypertension, Pt. on medications Normal cardiovascular exam+ pacemaker  Rhythm:Regular Rate:Normal  09/04/18 Echo  The left ventricle has normal systolic function with an ejection fraction of 60-65%. The cavity size was normal. There is mild concentric left ventricular hypertrophy. Left ventricular diastolic Doppler parameters are consistent with impaired  relaxation. There is mildly abnormal septal motion consistent with left bundle branch block.   Neuro/Psych negative neurological ROS  negative psych ROS   GI/Hepatic Neg liver ROS,   Endo/Other    Renal/GU negative Renal ROS     Musculoskeletal negative musculoskeletal ROS (+)   Abdominal (+) + obese,   Peds  Hematology   Anesthesia Other Findings   Reproductive/Obstetrics                            Anesthesia Physical Anesthesia Plan  ASA: III  Anesthesia Plan: General   Post-op Pain Management:    Induction: Intravenous  PONV Risk Score and Plan: 3 and Treatment may vary due to age or medical condition, Dexamethasone and Ondansetron  Airway Management Planned: Oral ETT  Additional Equipment: None  Intra-op Plan:   Post-operative Plan: Extubation in OR  Informed Consent: I have reviewed the patients History and Physical, chart, labs and discussed the procedure including the risks, benefits and alternatives for the proposed anesthesia with the patient or authorized representative who has indicated his/her  understanding and acceptance.     Dental advisory given  Plan Discussed with: CRNA  Anesthesia Plan Comments:        Anesthesia Quick Evaluation

## 2019-04-21 ENCOUNTER — Ambulatory Visit (INDEPENDENT_AMBULATORY_CARE_PROVIDER_SITE_OTHER): Payer: Medicare PPO | Admitting: *Deleted

## 2019-04-21 ENCOUNTER — Ambulatory Visit (HOSPITAL_COMMUNITY): Payer: Medicare PPO

## 2019-04-21 ENCOUNTER — Ambulatory Visit (HOSPITAL_BASED_OUTPATIENT_CLINIC_OR_DEPARTMENT_OTHER): Payer: Medicare PPO | Admitting: Anesthesiology

## 2019-04-21 ENCOUNTER — Encounter (HOSPITAL_BASED_OUTPATIENT_CLINIC_OR_DEPARTMENT_OTHER)
Admission: RE | Disposition: A | Discharge status: Home / Self Care | Payer: Self-pay | Source: Home / Self Care | Attending: Surgery

## 2019-04-21 ENCOUNTER — Ambulatory Visit (HOSPITAL_BASED_OUTPATIENT_CLINIC_OR_DEPARTMENT_OTHER)
Admission: RE | Admit: 2019-04-21 | Discharge: 2019-04-22 | Disposition: A | Discharge status: Home / Self Care | Payer: Medicare PPO | Attending: Surgery | Admitting: Surgery

## 2019-04-21 ENCOUNTER — Encounter (HOSPITAL_BASED_OUTPATIENT_CLINIC_OR_DEPARTMENT_OTHER): Payer: Self-pay | Admitting: Surgery

## 2019-04-21 DIAGNOSIS — I10 Essential (primary) hypertension: Secondary | ICD-10-CM | POA: Insufficient documentation

## 2019-04-21 DIAGNOSIS — K8010 Calculus of gallbladder with chronic cholecystitis without obstruction: Secondary | ICD-10-CM | POA: Diagnosis present

## 2019-04-21 DIAGNOSIS — Z79899 Other long term (current) drug therapy: Secondary | ICD-10-CM | POA: Insufficient documentation

## 2019-04-21 DIAGNOSIS — R002 Palpitations: Secondary | ICD-10-CM

## 2019-04-21 DIAGNOSIS — E7800 Pure hypercholesterolemia, unspecified: Secondary | ICD-10-CM | POA: Insufficient documentation

## 2019-04-21 DIAGNOSIS — Z7982 Long term (current) use of aspirin: Secondary | ICD-10-CM | POA: Insufficient documentation

## 2019-04-21 DIAGNOSIS — Z419 Encounter for procedure for purposes other than remedying health state, unspecified: Secondary | ICD-10-CM

## 2019-04-21 DIAGNOSIS — K811 Chronic cholecystitis: Secondary | ICD-10-CM | POA: Diagnosis not present

## 2019-04-21 DIAGNOSIS — K660 Peritoneal adhesions (postprocedural) (postinfection): Secondary | ICD-10-CM | POA: Insufficient documentation

## 2019-04-21 DIAGNOSIS — Z950 Presence of cardiac pacemaker: Secondary | ICD-10-CM | POA: Insufficient documentation

## 2019-04-21 DIAGNOSIS — Z91013 Allergy to seafood: Secondary | ICD-10-CM | POA: Insufficient documentation

## 2019-04-21 DIAGNOSIS — Z91041 Radiographic dye allergy status: Secondary | ICD-10-CM | POA: Insufficient documentation

## 2019-04-21 DIAGNOSIS — Z888 Allergy status to other drugs, medicaments and biological substances status: Secondary | ICD-10-CM | POA: Insufficient documentation

## 2019-04-21 DIAGNOSIS — R748 Abnormal levels of other serum enzymes: Secondary | ICD-10-CM | POA: Diagnosis present

## 2019-04-21 DIAGNOSIS — Z881 Allergy status to other antibiotic agents status: Secondary | ICD-10-CM | POA: Insufficient documentation

## 2019-04-21 HISTORY — DX: Chronic cholecystitis: K81.1

## 2019-04-21 HISTORY — PX: CHOLECYSTECTOMY: SHX55

## 2019-04-21 LAB — CUP PACEART REMOTE DEVICE CHECK
Date Time Interrogation Session: 20210125021045
Implantable Pulse Generator Implant Date: 20200710
Pulse Gen Serial Number: 9515998

## 2019-04-21 SURGERY — LAPAROSCOPIC CHOLECYSTECTOMY WITH INTRAOPERATIVE CHOLANGIOGRAM
Anesthesia: General | Site: Abdomen

## 2019-04-21 MED ORDER — ACETAMINOPHEN 650 MG RE SUPP: 650.0000 mg | Freq: Four times a day (QID) | RECTAL | Status: DC | PRN

## 2019-04-21 MED ORDER — HYDROCODONE-ACETAMINOPHEN 5-325 MG PO TABS
1.0000 | ORAL_TABLET | ORAL | Status: DC | PRN
  Administered 2019-04-21: 1 via ORAL
  Filled 2019-04-21: qty 1

## 2019-04-21 MED ORDER — LACTATED RINGERS IV SOLN: INTRAVENOUS | Status: DC

## 2019-04-21 MED ORDER — ACETAMINOPHEN 10 MG/ML IV SOLN
1000.0000 mg | Freq: Once | INTRAVENOUS | Status: DC | PRN
  Administered 2019-04-21: 1000 mg via INTRAVENOUS

## 2019-04-21 MED ORDER — MIDAZOLAM HCL 2 MG/2ML IJ SOLN: 1.0000 mg | INTRAMUSCULAR | Status: DC | PRN

## 2019-04-21 MED ORDER — PROPOFOL 10 MG/ML IV BOLUS
INTRAVENOUS | Status: AC
  Filled 2019-04-21: qty 20

## 2019-04-21 MED ORDER — EPHEDRINE 5 MG/ML INJ
INTRAVENOUS | Status: AC
  Filled 2019-04-21: qty 10

## 2019-04-21 MED ORDER — PROPOFOL 10 MG/ML IV BOLUS
INTRAVENOUS | Status: DC | PRN
  Administered 2019-04-21: 120 mg via INTRAVENOUS

## 2019-04-21 MED ORDER — ROCURONIUM BROMIDE 10 MG/ML (PF) SYRINGE
PREFILLED_SYRINGE | INTRAVENOUS | Status: DC | PRN
  Administered 2019-04-21: 20 mg via INTRAVENOUS
  Administered 2019-04-21: 50 mg via INTRAVENOUS
  Administered 2019-04-21: 30 mg via INTRAVENOUS

## 2019-04-21 MED ORDER — CEFAZOLIN SODIUM-DEXTROSE 2-3 GM-%(50ML) IV SOLR
INTRAVENOUS | Status: DC | PRN
  Administered 2019-04-21: 2 g via INTRAVENOUS

## 2019-04-21 MED ORDER — TRAMADOL HCL 50 MG PO TABS
50.0000 mg | ORAL_TABLET | Freq: Four times a day (QID) | ORAL | Status: DC | PRN
  Administered 2019-04-21: 50 mg via ORAL
  Filled 2019-04-21: qty 1

## 2019-04-21 MED ORDER — LIDOCAINE 2% (20 MG/ML) 5 ML SYRINGE
INTRAMUSCULAR | Status: AC
  Filled 2019-04-21: qty 5

## 2019-04-21 MED ORDER — FENTANYL CITRATE (PF) 250 MCG/5ML IJ SOLN
INTRAMUSCULAR | Status: DC | PRN
  Administered 2019-04-21: 50 ug via INTRAVENOUS
  Administered 2019-04-21: 25 ug via INTRAVENOUS
  Administered 2019-04-21 (×2): 50 ug via INTRAVENOUS
  Administered 2019-04-21: 25 ug via INTRAVENOUS

## 2019-04-21 MED ORDER — FENTANYL CITRATE (PF) 100 MCG/2ML IJ SOLN
INTRAMUSCULAR | Status: AC
  Filled 2019-04-21: qty 2

## 2019-04-21 MED ORDER — GLYCOPYRROLATE PF 0.2 MG/ML IJ SOSY
PREFILLED_SYRINGE | INTRAMUSCULAR | Status: DC | PRN
  Administered 2019-04-21: .2 mg via INTRAVENOUS

## 2019-04-21 MED ORDER — KCL IN DEXTROSE-NACL 20-5-0.45 MEQ/L-%-% IV SOLN
INTRAVENOUS | Status: DC
  Filled 2019-04-21: qty 1000

## 2019-04-21 MED ORDER — ONDANSETRON HCL 4 MG/2ML IJ SOLN
INTRAMUSCULAR | Status: AC
  Filled 2019-04-21: qty 2

## 2019-04-21 MED ORDER — ONDANSETRON HCL 4 MG/2ML IJ SOLN: 4.0000 mg | Freq: Once | INTRAMUSCULAR | Status: DC | PRN

## 2019-04-21 MED ORDER — FENTANYL CITRATE (PF) 100 MCG/2ML IJ SOLN: 50.0000 ug | INTRAMUSCULAR | Status: DC | PRN

## 2019-04-21 MED ORDER — SODIUM CHLORIDE 0.9 % IR SOLN
Status: DC | PRN
  Administered 2019-04-21: 1500 mL

## 2019-04-21 MED ORDER — CEFAZOLIN SODIUM-DEXTROSE 2-4 GM/100ML-% IV SOLN
INTRAVENOUS | Status: AC
  Filled 2019-04-21: qty 100

## 2019-04-21 MED ORDER — ONDANSETRON HCL 4 MG/2ML IJ SOLN: 4.0000 mg | Freq: Four times a day (QID) | INTRAMUSCULAR | Status: DC | PRN

## 2019-04-21 MED ORDER — BUPIVACAINE HCL 0.25 % IJ SOLN
INTRAMUSCULAR | Status: DC | PRN
  Administered 2019-04-21: 30 mL

## 2019-04-21 MED ORDER — SUGAMMADEX SODIUM 200 MG/2ML IV SOLN
INTRAVENOUS | Status: DC | PRN
  Administered 2019-04-21: 200 mg via INTRAVENOUS

## 2019-04-21 MED ORDER — ACETAMINOPHEN 10 MG/ML IV SOLN
INTRAVENOUS | Status: AC
  Filled 2019-04-21: qty 100

## 2019-04-21 MED ORDER — LIDOCAINE 2% (20 MG/ML) 5 ML SYRINGE
INTRAMUSCULAR | Status: DC | PRN
  Administered 2019-04-21: 60 mg via INTRAVENOUS

## 2019-04-21 MED ORDER — BUPIVACAINE HCL (PF) 0.25 % IJ SOLN
INTRAMUSCULAR | Status: AC
  Filled 2019-04-21: qty 30

## 2019-04-21 MED ORDER — FENTANYL CITRATE (PF) 100 MCG/2ML IJ SOLN
25.0000 ug | INTRAMUSCULAR | Status: DC | PRN
  Administered 2019-04-21: 50 ug via INTRAVENOUS

## 2019-04-21 MED ORDER — GABAPENTIN 300 MG PO CAPS
300.0000 mg | ORAL_CAPSULE | Freq: Two times a day (BID) | ORAL | Status: DC
  Administered 2019-04-21 (×2): 300 mg via ORAL

## 2019-04-21 MED ORDER — SUCCINYLCHOLINE CHLORIDE 200 MG/10ML IV SOSY
PREFILLED_SYRINGE | INTRAVENOUS | Status: AC
  Filled 2019-04-21: qty 10

## 2019-04-21 MED ORDER — LACTATED RINGERS IV SOLN: INTRAVENOUS | Status: DC | PRN

## 2019-04-21 MED ORDER — HYDROMORPHONE HCL 1 MG/ML IJ SOLN: 1.0000 mg | INTRAMUSCULAR | Status: DC | PRN

## 2019-04-21 MED ORDER — PHENYLEPHRINE 40 MCG/ML (10ML) SYRINGE FOR IV PUSH (FOR BLOOD PRESSURE SUPPORT)
PREFILLED_SYRINGE | INTRAVENOUS | Status: AC
  Filled 2019-04-21: qty 10

## 2019-04-21 MED ORDER — BUPIVACAINE HCL (PF) 0.5 % IJ SOLN
INTRAMUSCULAR | Status: AC
  Filled 2019-04-21: qty 30

## 2019-04-21 MED ORDER — SODIUM CHLORIDE 0.9 % IV SOLN
INTRAVENOUS | Status: DC | PRN
  Administered 2019-04-21: 5 mL

## 2019-04-21 MED ORDER — ONDANSETRON HCL 4 MG/2ML IJ SOLN
INTRAMUSCULAR | Status: DC | PRN
  Administered 2019-04-21: 4 mg via INTRAVENOUS

## 2019-04-21 MED ORDER — CEFAZOLIN SODIUM-DEXTROSE 2-4 GM/100ML-% IV SOLN: 2.0000 g | INTRAVENOUS | Status: DC

## 2019-04-21 MED ORDER — DEXAMETHASONE SODIUM PHOSPHATE 10 MG/ML IJ SOLN
INTRAMUSCULAR | Status: AC
  Filled 2019-04-21: qty 1

## 2019-04-21 MED ORDER — ACETAMINOPHEN 325 MG PO TABS: 650.0000 mg | ORAL_TABLET | Freq: Four times a day (QID) | ORAL | Status: DC | PRN

## 2019-04-21 MED ORDER — ONDANSETRON 4 MG PO TBDP: 4.0000 mg | ORAL_TABLET | Freq: Four times a day (QID) | ORAL | Status: DC | PRN

## 2019-04-21 MED ORDER — HYDROCHLOROTHIAZIDE 25 MG PO TABS
25.0000 mg | ORAL_TABLET | Freq: Every day | ORAL | Status: DC
  Administered 2019-04-21: 25 mg via ORAL

## 2019-04-21 MED ORDER — DEXAMETHASONE SODIUM PHOSPHATE 10 MG/ML IJ SOLN
INTRAMUSCULAR | Status: DC | PRN
  Administered 2019-04-21: 4 mg via INTRAVENOUS

## 2019-04-21 MED ORDER — ZOLPIDEM TARTRATE 10 MG PO TABS: 10.0000 mg | ORAL_TABLET | Freq: Every evening | ORAL | Status: DC | PRN

## 2019-04-21 MED ORDER — ROCURONIUM BROMIDE 10 MG/ML (PF) SYRINGE
PREFILLED_SYRINGE | INTRAVENOUS | Status: AC
  Filled 2019-04-21: qty 10

## 2019-04-21 SURGICAL SUPPLY — 44 items
ADH SKN CLS APL DERMABOND .7 (GAUZE/BANDAGES/DRESSINGS) ×1
APL PRP STRL LF DISP 70% ISPRP (MISCELLANEOUS) ×1
APPLIER CLIP ROT 10 11.4 M/L (STAPLE) ×2
APR CLP MED LRG 11.4X10 (STAPLE) ×1
BAG SPEC RTRVL LRG 6X4 10 (ENDOMECHANICALS) ×1
BLADE CLIPPER SURG (BLADE) ×1 IMPLANT
CANISTER SUCT 1200ML W/VALVE (MISCELLANEOUS) ×2 IMPLANT
CHLORAPREP W/TINT 26 (MISCELLANEOUS) ×2 IMPLANT
CLIP APPLIE ROT 10 11.4 M/L (STAPLE) ×1 IMPLANT
COVER MAYO STAND STRL (DRAPES) ×2 IMPLANT
COVER SURGICAL LIGHT HANDLE (MISCELLANEOUS) ×1 IMPLANT
COVER WAND RF STERILE (DRAPES) IMPLANT
DERMABOND ADVANCED (GAUZE/BANDAGES/DRESSINGS) ×1
DERMABOND ADVANCED .7 DNX12 (GAUZE/BANDAGES/DRESSINGS) IMPLANT
DRAPE C-ARM 42X72 X-RAY (DRAPES) ×2 IMPLANT
ELECT REM PT RETURN 9FT ADLT (ELECTROSURGICAL) ×2
ELECTRODE REM PT RTRN 9FT ADLT (ELECTROSURGICAL) ×1 IMPLANT
GLOVE BIO SURGEON STRL SZ 6.5 (GLOVE) ×1 IMPLANT
GLOVE BIOGEL PI IND STRL 7.0 (GLOVE) IMPLANT
GLOVE BIOGEL PI INDICATOR 7.0 (GLOVE) ×3
GLOVE ECLIPSE 6.5 STRL STRAW (GLOVE) ×2 IMPLANT
GLOVE SURG ORTHO 8.0 STRL STRW (GLOVE) ×2 IMPLANT
GOWN STRL REUS W/ TWL LRG LVL3 (GOWN DISPOSABLE) ×2 IMPLANT
GOWN STRL REUS W/ TWL XL LVL3 (GOWN DISPOSABLE) ×1 IMPLANT
GOWN STRL REUS W/TWL LRG LVL3 (GOWN DISPOSABLE) ×4
GOWN STRL REUS W/TWL XL LVL3 (GOWN DISPOSABLE) ×2
NS IRRIG 1000ML POUR BTL (IV SOLUTION) ×1 IMPLANT
PACK BASIN DAY SURGERY FS (CUSTOM PROCEDURE TRAY) ×3 IMPLANT
PAD ARMBOARD 7.5X6 YLW CONV (MISCELLANEOUS) ×2 IMPLANT
POUCH SPECIMEN RETRIEVAL 10MM (ENDOMECHANICALS) ×1 IMPLANT
SCISSORS LAP 5X35 DISP (ENDOMECHANICALS) ×2 IMPLANT
SET CHOLANGIOGRAPH 5 50 .035 (SET/KITS/TRAYS/PACK) ×2 IMPLANT
SET IRRIG TUBING LAPAROSCOPIC (IRRIGATION / IRRIGATOR) ×2 IMPLANT
SET TUBE SMOKE EVAC HIGH FLOW (TUBING) ×2 IMPLANT
SLEEVE ENDOPATH XCEL 5M (ENDOMECHANICALS) ×2 IMPLANT
SPONGE GAUZE 2X2 8PLY STRL LF (GAUZE/BANDAGES/DRESSINGS) ×8 IMPLANT
STRIP CLOSURE SKIN 1/2X4 (GAUZE/BANDAGES/DRESSINGS) ×2 IMPLANT
SUT MNCRL AB 4-0 PS2 18 (SUTURE) ×2 IMPLANT
TOWEL GREEN STERILE FF (TOWEL DISPOSABLE) ×2 IMPLANT
TRAY LAPAROSCOPIC (CUSTOM PROCEDURE TRAY) ×2 IMPLANT
TROCAR XCEL BLUNT TIP 100MML (ENDOMECHANICALS) ×2 IMPLANT
TROCAR XCEL NON-BLD 11X100MML (ENDOMECHANICALS) ×2 IMPLANT
TROCAR XCEL NON-BLD 5MMX100MML (ENDOMECHANICALS) ×2 IMPLANT
TUBE CONNECTING 20X1/4 (TUBING) ×2 IMPLANT

## 2019-04-21 NOTE — Anesthesia Procedure Notes (Signed)
Procedure Name: Intubation Date/Time: 04/21/2019 9:31 AM Performed by: Myna Bright, CRNA Pre-anesthesia Checklist: Patient identified, Emergency Drugs available, Suction available and Patient being monitored Patient Re-evaluated:Patient Re-evaluated prior to induction Oxygen Delivery Method: Circle system utilized Preoxygenation: Pre-oxygenation with 100% oxygen Induction Type: IV induction Ventilation: Mask ventilation without difficulty Laryngoscope Size: Mac and 4 Grade View: Grade II Tube type: Oral Tube size: 8.0 mm Number of attempts: 2 Airway Equipment and Method: Stylet Placement Confirmation: ETT inserted through vocal cords under direct vision,  positive ETCO2 and breath sounds checked- equal and bilateral Secured at: 22 cm Tube secured with: Tape Dental Injury: Teeth and Oropharynx as per pre-operative assessment

## 2019-04-21 NOTE — Anesthesia Postprocedure Evaluation (Signed)
Anesthesia Post Note  Patient: Dylan Zimmerman  Procedure(s) Performed: LAPAROSCOPIC CHOLECYSTECTOMY WITH INTRAOPERATIVE CHOLANGIOGRAM (N/A Abdomen)     Patient location during evaluation: PACU Anesthesia Type: General Level of consciousness: awake and alert Pain management: pain level controlled Vital Signs Assessment: post-procedure vital signs reviewed and stable Respiratory status: spontaneous breathing, nonlabored ventilation, respiratory function stable and patient connected to nasal cannula oxygen Cardiovascular status: blood pressure returned to baseline and stable Postop Assessment: no apparent nausea or vomiting Anesthetic complications: no    Last Vitals:  Vitals:   04/21/19 1200 04/21/19 1240  BP: (!) 143/73 (!) 142/68  Pulse: 73 77  Resp: 11 16  Temp:  (!) 36.4 C  SpO2: 94% 95%    Last Pain:  Vitals:   04/21/19 1240  TempSrc:   PainSc: 3                  Barnet [place]

## 2019-04-21 NOTE — Transfer of Care (Signed)
Immediate Anesthesia Transfer of Care Note  Patient: Dylan Zimmerman  Procedure(s) Performed: LAPAROSCOPIC CHOLECYSTECTOMY WITH INTRAOPERATIVE CHOLANGIOGRAM (N/A Abdomen)  Patient Location: PACU  Anesthesia Type:General  Level of Consciousness: sedated, patient cooperative and responds to stimulation  Airway & Oxygen Therapy: Patient Spontanous Breathing and Patient connected to face mask oxygen  Post-op Assessment: Report given to RN, Post -op Vital signs reviewed and stable and Patient moving all extremities  Post vital signs: Reviewed and stable  Last Vitals:  Vitals Value Taken Time  BP 154/77 04/21/19 1126  Temp    Pulse 67 04/21/19 1130  Resp 14 04/21/19 1130  SpO2 100 % 04/21/19 1130  Vitals shown include unvalidated device data.  Last Pain:  Vitals:   04/21/19 0817  TempSrc: Temporal  PainSc: 0-No pain         Complications: No apparent anesthesia complications

## 2019-04-21 NOTE — Interval H&P Note (Signed)
History and Physical Interval Note:  04/21/2019 8:50 AM  Dylan Zimmerman  has presented today for surgery, with the diagnosis of CHRONIC CHOLECYSTITIS, CHOLELITHIASIS ELEVATED LIVER ENZYMES.  The various methods of treatment have been discussed with the patient and family. After consideration of risks, benefits and other options for treatment, the patient has consented to    Procedure(s): LAPAROSCOPIC CHOLECYSTECTOMY WITH INTRAOPERATIVE CHOLANGIOGRAM (N/A) as a surgical intervention.    The patient's history has been reviewed, patient examined, no change in status, stable for surgery.  I have reviewed the patient's chart and labs.  Questions were answered to the patient's satisfaction.    Armandina Gemma, MD [facility] Office: [place]

## 2019-04-21 NOTE — Op Note (Signed)
Procedure Note  Pre-operative Diagnosis:  Chronic cholecystitis, cholelithiasis, elevated LFT's  Post-operative Diagnosis:  same  Surgeon:  Armandina Gemma, MD  Assistant:  Carlena Hurl, PA-C   Procedure:  Laparoscopic cholecystectomy with intra-operative cholangiography  Anesthesia:  General  Estimated Blood Loss:  minimal  Drains: none         Specimen: gallbladder to pathology  Indications:  Patient is a 79 year old male referred by his primary care physician, Dr. Lujean Amel, for surgical evaluation and management of symptomatic cholelithiasis and a history of elevated liver enzymes. Patient had an episode of abdominal pain in October 2020 which was severe and radiated to the shoulder. Patient was seen by his primary physician and laboratory studies showed elevated liver enzymes. Ultrasound was performed on February 06, 2019. This showed multiple gallstones with sludge in the gallbladder. There was no wall thickening. Common bile duct was normal at 4 mm. Repeat liver enzymes were normal. Patient denies any history of jaundice or acholic stools. He has had no prior abdominal surgery. He has no history of hepatobiliary or pancreatic disease. There is a family history of gallbladder disease in both of his daughters and possibly his mother. Patient presents today for cholecystectomy.  Procedure Details:  The patient was seen in the pre-op holding area. The risks, benefits, complications, treatment options, and expected outcomes were previously discussed with the patient. The patient agreed with the proposed plan and has signed the informed consent form.  The patient was transported to operating room # 5 at the [facility]. The patient was placed in the supine position on the operating room table. Following induction of general anesthesia, the abdomen was prepped and draped in the usual aseptic fashion.  An incision was made in the skin near the umbilicus. The midline  fascia was incised and the peritoneal cavity was entered and a Hasson cannula was introduced under direct vision. The cannula was secured with a 0-Vicryl pursestring suture. Pneumoperitoneum was established with carbon dioxide. Additional cannulae were introduced under direct vision along the right costal margin in the midline, mid-clavicular line, and anterior axillary line.   There were moderate omental adhesions to the gallbladder and liver. The gallbladder was identified and the fundus grasped and retracted cephalad. Adhesions were taken down bluntly and the electrocautery was utilized as needed, taking care not to involve any adjacent structures. The infundibulum was grasped and retracted laterally, exposing the peritoneum overlying the triangle of Calot. There was extensive fibrosis in this area and dissection was slow and meticulous. The peritoneum was incised and structures exposed with blunt dissection. The cystic duct was clearly identified, bluntly dissected circumferentially, and clipped at the neck of the gallbladder.  An incision was made in the cystic duct and the cholangiogram catheter introduced. The catheter was secured using an ligaclip.  Real-time cholangiography was performed using C-arm fluoroscopy.  There was rapid filling of a normal caliber common bile duct.  There was reflux of contrast into the left and right hepatic ductal systems.  There was free flow distally into the duodenum without filling defect or obstruction.  The catheter was removed from the peritoneal cavity.  The cystic duct was then ligated with ligaclips and divided. The cystic artery was identified, dissected circumferentially, ligated with ligaclips, and divided.  The gallbladder was dissected away from the gallbladder bed using the electrocautery for hemostasis. The gallbladder was completely removed from the liver and placed into an endocatch bag. The gallbladder was removed in the endocatch bag through the  umbilical port site and submitted to pathology for review.  The right upper quadrant was irrigated and the gallbladder bed was inspected. Hemostasis was achieved with the electrocautery.  Cannulae were removed under direct vision and good hemostasis was noted. Pneumoperitoneum was released and the majority of the carbon dioxide evacuated. The umbilical wound was irrigated and the fascia was then closed with the pursestring suture.  Local anesthetic was infiltrated at all port sites. Skin incisions were closed with 4-0 Monocril subcuticular sutures and Dermabond was applied.  Instrument, sponge, and needle counts were correct at the conclusion of the case.  The patient was awakened from anesthesia and brought to the recovery room in stable condition.  The patient tolerated the procedure well.   Armandina Gemma, MD [facility] Office: [phone]

## 2019-04-22 ENCOUNTER — Encounter: Payer: Self-pay | Admitting: *Deleted

## 2019-04-22 DIAGNOSIS — K811 Chronic cholecystitis: Secondary | ICD-10-CM | POA: Diagnosis not present

## 2019-04-22 LAB — SURGICAL PATHOLOGY

## 2019-04-22 MED ORDER — ZOLPIDEM TARTRATE 5 MG PO TABS
5.0000 mg | ORAL_TABLET | Freq: Every evening | ORAL | Status: DC | PRN
  Administered 2019-04-22: 5 mg via ORAL
  Filled 2019-04-22: qty 1

## 2019-04-22 NOTE — Discharge Summary (Signed)
Physician Discharge Summary [facility]  Patient ID: Dylan Zimmerman MRN: [id number] DOB/AGE: [birth year] 79 y.o.  Admit date: 04/21/2019 Discharge date: 04/22/2019  Admission Diagnoses:  Chronic cholecystitis, cholelithiasis, elevated LFT's  Discharge Diagnoses:  Principal Problem:   Cholelithiasis with chronic cholecystitis Active Problems:   Elevated liver enzymes   Discharged Condition: good  Hospital Course: Patient was admitted for observation following gallbladder surgery.  Post op course was uncomplicated.  Pain was well controlled.  Tolerated diet.  Patient was prepared for discharge home on POD#1.  Consults: None  Treatments: surgery: lap chole with IOC  Discharge Exam: Blood pressure (!) 153/67, pulse (!) 59, temperature 98.5 F (36.9 C), resp. rate 16, height 5\' 11"  (1.803 m), weight 112.3 kg, SpO2 95 %. HEENT - clear Neck - soft Chest - clear bilaterally Cor - RRR Abd - soft without distension; wounds dry and intact  Disposition: Home  Discharge Instructions    Diet - low sodium heart healthy   Complete by: As directed    Discharge instructions   Complete by: As directed    [facility]  LAPAROSCOPIC SURGERY:  POST-OP INSTRUCTIONS  Always review your discharge instruction sheet given to you by the facility where your surgery was performed.  A prescription for pain medication may be given to you upon discharge.  Take your pain medication as prescribed.  If narcotic pain medicine is not needed, then you may take acetaminophen (Tylenol) or ibuprofen (Advil) as needed.  Take your usually prescribed medications unless otherwise directed.  If you need a refill on your pain medication, please contact your pharmacy.  They will contact our office to request authorization. Prescriptions will not be filled after 5 P.M. or on weekends.  You should follow a light diet the first few days after arrival home, such as soup and  crackers or toast.  Be sure to include plenty of fluids daily.  Most patients will experience some swelling and bruising in the area of the incisions.  Ice packs will help.  Swelling and bruising can take several days to resolve.   It is common to experience some constipation after surgery.  Increasing fluid intake and taking a stool softener (such as Colace) will usually help or prevent this problem from occurring.  A mild laxative (Milk of Magnesia or Miralax) should be taken according to package instructions if there has been no bowel movement after 48 hours.  You will likely have Dermabond (topical glue) over your incisions.  This seals the incisions and allows you to bathe and shower at any time after your surgery.  Glue should remain in place for up to 10 days.  It may be removed after 10 days by pealing off the Dermabond material or using Vaseline or naval jelly to remove.  If you have steri-strips over your incisions, you may remove the gauze bandage on the second day after surgery, and you may shower at that time.  Leave your steri-strips (small skin tapes) in place directly over the incision.  These strips should remain on the skin for 5-7 days and then be removed.  You may get them wet in the shower and pat them dry.  Any sutures or staples will be removed at the office during your follow-up visit.  ACTIVITIES:  You may resume regular (light) daily activities beginning the next day - such as daily self-care, walking, climbing stairs - gradually increasing activities as tolerated.  You may have sexual intercourse when  it is comfortable.  Refrain from any heavy lifting or straining until approved by your doctor.  You may drive when you are no longer taking prescription pain medication, when you can comfortably wear a seatbelt, and when you can safely maneuver your car and apply brakes.  You should see your doctor in the office for a follow-up appointment approximately 2-3 weeks after your  surgery.  Make sure that you call for this appointment within a day or two after you arrive home to insure a convenient appointment time.  WHEN TO CALL YOUR DOCTOR: Fever over 101.0 Inability to urinate Continued bleeding from incision Increased pain, redness, or drainage from the incision Increasing abdominal pain  The clinic staff is available to answer your questions during regular business hours.  Please don't hesitate to call and ask to speak to one of the nurses for clinical concerns.  If you have a medical emergency, go to the nearest emergency room or call 911.  A surgeon from [facility] is always on call for the hospital.  Earnstine Regal, MD, [facility] Office: [place] Free:  [place] [phone]  Website: [email or web address]   Increase activity slowly   Complete by: As directed    No dressing needed   Complete by: As directed      Allergies as of 04/22/2019      Reactions   Shellfish Allergy Anaphylaxis   Bactrim [sulfamethoxazole-trimethoprim] Nausea And Vomiting   Iodine       Medication List    TAKE these medications   aspirin EC 81 MG tablet Take 81 mg by mouth daily.   atorvastatin 10 MG tablet Commonly known as: LIPITOR Take 10 mg by mouth daily.   cholecalciferol 25 MCG (1000 UNIT) tablet Commonly known as: VITAMIN D3 Take 1,000 Units by mouth daily.   Fish Oil 1000 MG Caps Take by mouth.   gabapentin 300 MG capsule Commonly known as: NEURONTIN Take 300 mg by mouth 2 (two) times daily.   hydrochlorothiazide 25 MG tablet Commonly known as: HYDRODIURIL Take 1 tablet (25 mg total) by mouth daily.   HYDROcodone-acetaminophen 5-325 MG tablet Commonly known as: NORCO/VICODIN Take 325 tablets by mouth as needed.   multivitamin tablet Take 1 tablet by mouth daily.   sildenafil 20 MG tablet Commonly known as: REVATIO Take 1 tablet by mouth as needed.   vitamin B-12 100  MCG tablet Commonly known as: CYANOCOBALAMIN Take 100 mcg by mouth daily.   Vitamin C 500 MG Caps Take by mouth.   zolpidem 10 MG tablet Commonly known as: AMBIEN Take 10 mg by mouth at bedtime as needed for sleep.      Follow-up Information    Armandina Gemma, MD. Schedule an appointment as soon as possible for a visit in 3 week(s).   Specialty: General Surgery Contact information: [address] [place] [address] [phone]           Earnstine Regal, MD, [facility] Office: [phone]   Signed: Armandina Gemma 04/22/2019, 7:07 AM

## 2019-05-26 ENCOUNTER — Ambulatory Visit (INDEPENDENT_AMBULATORY_CARE_PROVIDER_SITE_OTHER): Payer: Medicare PPO | Admitting: *Deleted

## 2019-05-26 DIAGNOSIS — R002 Palpitations: Secondary | ICD-10-CM | POA: Diagnosis not present

## 2019-05-26 LAB — CUP PACEART REMOTE DEVICE CHECK
Date Time Interrogation Session: 20210301020407
Implantable Pulse Generator Implant Date: 20200710
Pulse Gen Serial Number: 9515998

## 2019-05-27 NOTE — Progress Notes (Signed)
ILR Remote 

## 2019-06-02 NOTE — Progress Notes (Signed)
Cardiology Office Note:    Date:  06/03/2019   ID:  Dylan Zimmerman, DOB Sep 21, 1940, MRN [id number]  PCP:  Lujean Amel, MD  Cardiologist:  Shirlee More, MD    Referring MD: Lujean Amel, MD    ASSESSMENT:    1. Syncope and collapse   2. Essential hypertension   3. Left bundle branch block   4. PVC's (premature ventricular contractions)    PLAN:    In order of problems listed above:  1. In retrospect we both think that his syncope was orthostatic and resolved off ACE inhibitor.  I did tell him in the circumstances about 40% of the time he will document a significant arrhythmia within 6 months of a loop recorder and will continue to monitor.  His blood pressure is at target he will continue his current treatment and monitor blood pressure at home.  His PVCs are asymptomatic and he has no complex ventricular arrhythmia.  L5 was favorable at target we will continue his statin is not having muscle symptoms of weakness or pain   Next appointment: 6 months   Medication Adjustments/Labs and Tests Ordered: Current medicines are reviewed at length with the patient today.  Concerns regarding medicines are outlined above.  No orders of the defined types were placed in this encounter.  No orders of the defined types were placed in this encounter.   No chief complaint on file.   History of Present Illness:    Dylan Zimmerman is a 79 y.o. male with a hx of syncope, PVCs and left bundle branch block  last seen 09/24/2018 and referred for an ILR.  He was last seen 12/25/2018.  His device downloads have shown no arrhythmia.  White bothered by back pain he is finally recovered and did not need to have lumbosacral disc surgery.  His home blood pressures run consistently in the range of 135/70 and average heart rate 55 bpm at home and has had no lightheadedness syncope near syncope.  No edema shortness of breath or chest pain.  Overall pleased with the quality of his life and will continue to  follow his loop recorder Compliance with diet, lifestyle and medications: Yes Past Medical History:  Diagnosis Date  . Chronic cholecystitis   . Essential hypertension 06/18/2018  . Hyperlipidemia   . Hyperlipidemia   . Hypertension   . Obesity 06/18/2018  . Palpitations 06/18/2018  . Syncope and collapse     Past Surgical History:  Procedure Laterality Date  . CATARACT EXTRACTION Bilateral   . CHOLECYSTECTOMY N/A 04/21/2019   Procedure: LAPAROSCOPIC CHOLECYSTECTOMY WITH INTRAOPERATIVE CHOLANGIOGRAM;  Surgeon: Armandina Gemma, MD;  Location: [place];  Service: General;  Laterality: N/A;  . LITHOTRIPSY    . LOOP RECORDER INSERTION N/A 10/04/2018   Procedure: LOOP RECORDER INSERTION;  Surgeon: Constance Haw, MD;  Location: [place] [facility];  Service: Cardiovascular;  Laterality: N/A;  . TONSILLECTOMY      Current Medications: Current Meds  Medication Sig  . Ascorbic Acid (VITAMIN C) 500 MG CAPS Take by mouth.  Marland Kitchen aspirin EC 81 MG tablet Take 81 mg by mouth daily.  Marland Kitchen atorvastatin (LIPITOR) 10 MG tablet Take 10 mg by mouth daily.  . cholecalciferol (VITAMIN D3) 25 MCG (1000 UNIT) tablet Take 1,000 Units by mouth daily.  . diclofenac (VOLTAREN) 75 MG EC tablet 1 TABLET TWICE A DAY FOR THE NEXT 5 DAYS, THEN AS NEEDED ORALLY 10 DAYS  . hydrochlorothiazide (HYDRODIURIL) 25 MG tablet Take 1 tablet (  25 mg total) by mouth daily.  . Multiple Vitamin (MULTIVITAMIN) tablet Take 1 tablet by mouth daily.  . Omega-3 Fatty Acids (FISH OIL) 1000 MG CAPS Take by mouth.  Marland Kitchen SHINGRIX injection   . sildenafil (REVATIO) 20 MG tablet Take 1 tablet by mouth as needed.  . vitamin B-12 (CYANOCOBALAMIN) 100 MCG tablet Take 100 mcg by mouth daily.  Marland Kitchen zolpidem (AMBIEN) 10 MG tablet Take 10 mg by mouth at bedtime as needed for sleep.      Allergies:   Shellfish allergy, Bactrim [sulfamethoxazole-trimethoprim], and Iodine   Social History   Socioeconomic History  . Marital status:  Married    Spouse name: Not on file  . Number of children: Not on file  . Years of education: Not on file  . Highest education level: Not on file  Occupational History  . Not on file  Tobacco Use  . Smoking status: Never Smoker  . Smokeless tobacco: Never Used  Substance and Sexual Activity  . Alcohol use: Never  . Drug use: Never  . Sexual activity: Yes  Other Topics Concern  . Not on file  Social History Narrative  . Not on file   Social Determinants of Health   Financial Resource Strain:   . Difficulty of Paying Living Expenses: Not on file  Food Insecurity:   . Worried About Charity fundraiser in the Last Year: Not on file  . Ran Out of Food in the Last Year: Not on file  Transportation Needs:   . Lack of Transportation (Medical): Not on file  . Lack of Transportation (Non-Medical): Not on file  Physical Activity:   . Days of Exercise per Week: Not on file  . Minutes of Exercise per Session: Not on file  Stress:   . Feeling of Stress : Not on file  Social Connections:   . Frequency of Communication with Friends and Family: Not on file  . Frequency of Social Gatherings with Friends and Family: Not on file  . Attends Religious Services: Not on file  . Active Member of Clubs or Organizations: Not on file  . Attends Archivist Meetings: Not on file  . Marital Status: Not on file     Family History: The patient's family history includes Cancer in his father and mother; Heart attack in his maternal grandfather and paternal grandmother; Heart disease in his paternal grandfather and paternal grandmother. ROS:   Please see the history of present illness.    All other systems reviewed and are negative.  EKGs/Labs/Other Studies Reviewed:    The following studies were reviewed today:  05/08/2019: CMP normal except for glucose of 108 creatinine normal 0.99 potassium 3.9 normal liver function test Cholesterol at target 155 HDL 39 LDL 75  Recent  Labs: 09/24/2018: ALT 19 04/17/2019: BUN 15; Creatinine, Ser 1.06; Potassium 4.3; Sodium 140  Recent Lipid Panel    Component Value Date/Time   CHOL 149 10/01/2018 1016   TRIG 263 (H) 10/01/2018 1016   HDL 35 (L) 10/01/2018 1016   CHOLHDL 4.3 10/01/2018 1016   LDLCALC 61 10/01/2018 1016    Physical Exam:    VS:  BP 136/72   Pulse 69   Temp 97.9 F (36.6 C)   Ht 5\' 11"  (1.803 m)   Wt 249 lb (112.9 kg)   SpO2 96%   BMI 34.73 kg/m     Wt Readings from Last 3 Encounters:  06/03/19 249 lb (112.9 kg)  04/21/19 247 lb 9.2  oz (112.3 kg)  02/03/19 237 lb (107.5 kg)     GEN:  Well nourished, well developed in no acute distress HEENT: Normal NECK: No JVD; No carotid bruits LYMPHATICS: No lymphadenopathy CARDIAC: RRR, no murmurs, rubs, gallops RESPIRATORY:  Clear to auscultation without rales, wheezing or rhonchi  ABDOMEN: Soft, non-tender, non-distended MUSCULOSKELETAL:  No edema; No deformity  SKIN: Warm and dry NEUROLOGIC:  Alert and oriented x 3 PSYCHIATRIC:  Normal affect    Signed, Shirlee More, MD  06/03/2019 9:43 AM    [place]

## 2019-06-03 ENCOUNTER — Other Ambulatory Visit: Payer: Self-pay

## 2019-06-03 ENCOUNTER — Ambulatory Visit (INDEPENDENT_AMBULATORY_CARE_PROVIDER_SITE_OTHER): Payer: Medicare PPO | Admitting: Cardiology

## 2019-06-03 ENCOUNTER — Encounter: Payer: Self-pay | Admitting: Cardiology

## 2019-06-03 VITALS — BP 136/72 | HR 69 | Temp 97.9°F | Ht 71.0 in | Wt 249.0 lb

## 2019-06-03 DIAGNOSIS — I10 Essential (primary) hypertension: Secondary | ICD-10-CM | POA: Diagnosis not present

## 2019-06-03 DIAGNOSIS — R55 Syncope and collapse: Secondary | ICD-10-CM | POA: Diagnosis not present

## 2019-06-03 DIAGNOSIS — I493 Ventricular premature depolarization: Secondary | ICD-10-CM

## 2019-06-03 DIAGNOSIS — I447 Left bundle-branch block, unspecified: Secondary | ICD-10-CM | POA: Diagnosis not present

## 2019-06-03 NOTE — Patient Instructions (Signed)
Medication Instructions:  ?Your physician recommends that you continue on your current medications as directed. Please refer to the Current Medication list given to you today. ? ?*If you need a refill on your cardiac medications before your next appointment, please call your pharmacy* ? ? ?Lab Work: ?NONE ?If you have labs (blood work) drawn today and your tests are completely normal, you will receive your results only by: ?MyChart Message (if you have MyChart) OR ?A paper copy in the mail ?If you have any lab test that is abnormal or we need to change your treatment, we will call you to review the results. ? ? ?Testing/Procedures: ?NONE ? ? ?Follow-Up: ?At [facility], you and your health needs are our priority.  As part of our continuing mission to provide you with exceptional heart care, we have created designated Provider Care Teams.  These Care Teams include your primary Cardiologist (physician) and Advanced Practice Providers (APPs -  Physician Assistants and Nurse Practitioners) who all work together to provide you with the care you need, when you need it. ? ?We recommend signing up for the patient portal called "MyChart".  Sign up information is provided on this After Visit Summary.  MyChart is used to connect with patients for Virtual Visits (Telemedicine).  Patients are able to view lab/test results, encounter notes, upcoming appointments, etc.  Non-urgent messages can be sent to your provider as well.   ?To learn more about what you can do with MyChart, go to [email or web address].   ? ?Your next appointment:   ?6 month(s) ? ?The format for your next appointment:   ?In Person ? ?Provider:   ?Brian Munley, MD  ? ? ?Other Instructions ?  ?

## 2019-06-26 ENCOUNTER — Ambulatory Visit (INDEPENDENT_AMBULATORY_CARE_PROVIDER_SITE_OTHER): Payer: Medicare PPO | Admitting: *Deleted

## 2019-06-26 DIAGNOSIS — R002 Palpitations: Secondary | ICD-10-CM | POA: Diagnosis not present

## 2019-06-26 LAB — CUP PACEART REMOTE DEVICE CHECK
Date Time Interrogation Session: 20210401021002
Implantable Pulse Generator Implant Date: 20200710
Pulse Gen Serial Number: 9515998

## 2019-06-27 NOTE — Progress Notes (Signed)
ILR Remote 

## 2019-07-14 ENCOUNTER — Other Ambulatory Visit: Payer: Self-pay

## 2019-07-14 DIAGNOSIS — I8312 Varicose veins of left lower extremity with inflammation: Secondary | ICD-10-CM | POA: Diagnosis not present

## 2019-07-14 DIAGNOSIS — D225 Melanocytic nevi of trunk: Secondary | ICD-10-CM | POA: Diagnosis not present

## 2019-07-14 DIAGNOSIS — C44622 Squamous cell carcinoma of skin of right upper limb, including shoulder: Secondary | ICD-10-CM | POA: Diagnosis not present

## 2019-07-14 DIAGNOSIS — I872 Venous insufficiency (chronic) (peripheral): Secondary | ICD-10-CM | POA: Diagnosis not present

## 2019-07-14 DIAGNOSIS — Z85828 Personal history of other malignant neoplasm of skin: Secondary | ICD-10-CM | POA: Diagnosis not present

## 2019-07-14 DIAGNOSIS — C44222 Squamous cell carcinoma of skin of right ear and external auricular canal: Secondary | ICD-10-CM | POA: Diagnosis not present

## 2019-07-14 DIAGNOSIS — L570 Actinic keratosis: Secondary | ICD-10-CM | POA: Diagnosis not present

## 2019-07-14 DIAGNOSIS — D224 Melanocytic nevi of scalp and neck: Secondary | ICD-10-CM | POA: Diagnosis not present

## 2019-07-14 DIAGNOSIS — L821 Other seborrheic keratosis: Secondary | ICD-10-CM | POA: Diagnosis not present

## 2019-07-14 DIAGNOSIS — I8311 Varicose veins of right lower extremity with inflammation: Secondary | ICD-10-CM | POA: Diagnosis not present

## 2019-07-14 DIAGNOSIS — L814 Other melanin hyperpigmentation: Secondary | ICD-10-CM | POA: Diagnosis not present

## 2019-07-14 MED ORDER — HYDROCHLOROTHIAZIDE 25 MG PO TABS: 25.0000 mg | ORAL_TABLET | Freq: Every day | ORAL | 1 refills | Status: DC

## 2019-07-22 DIAGNOSIS — H40053 Ocular hypertension, bilateral: Secondary | ICD-10-CM | POA: Diagnosis not present

## 2019-07-22 DIAGNOSIS — Z9889 Other specified postprocedural states: Secondary | ICD-10-CM | POA: Diagnosis not present

## 2019-07-22 DIAGNOSIS — H53021 Refractive amblyopia, right eye: Secondary | ICD-10-CM | POA: Diagnosis not present

## 2019-07-22 DIAGNOSIS — H35372 Puckering of macula, left eye: Secondary | ICD-10-CM | POA: Diagnosis not present

## 2019-07-28 ENCOUNTER — Ambulatory Visit (INDEPENDENT_AMBULATORY_CARE_PROVIDER_SITE_OTHER): Payer: Medicare PPO | Admitting: *Deleted

## 2019-07-28 DIAGNOSIS — R55 Syncope and collapse: Secondary | ICD-10-CM

## 2019-07-29 ENCOUNTER — Telehealth: Payer: Self-pay

## 2019-07-29 LAB — CUP PACEART REMOTE DEVICE CHECK
Date Time Interrogation Session: 20210504113418
Implantable Pulse Generator Implant Date: 20200710
Pulse Gen Serial Number: 9515998

## 2019-07-29 NOTE — Telephone Encounter (Signed)
Spoke with patient to remind of missed remote transmission 

## 2019-07-30 NOTE — Progress Notes (Signed)
ILR

## 2019-08-28 ENCOUNTER — Ambulatory Visit (INDEPENDENT_AMBULATORY_CARE_PROVIDER_SITE_OTHER): Payer: Medicare PPO | Admitting: *Deleted

## 2019-08-28 DIAGNOSIS — R55 Syncope and collapse: Secondary | ICD-10-CM | POA: Diagnosis not present

## 2019-08-29 ENCOUNTER — Telehealth: Payer: Self-pay

## 2019-08-29 LAB — CUP PACEART REMOTE DEVICE CHECK
Date Time Interrogation Session: 20210604100635
Implantable Pulse Generator Implant Date: 20200710
Pulse Gen Serial Number: 9515998

## 2019-08-29 NOTE — Telephone Encounter (Signed)
Spoke with patient to remind of missed remote transmission 

## 2019-09-02 NOTE — Progress Notes (Signed)
ILR

## 2019-09-15 DIAGNOSIS — M7989 Other specified soft tissue disorders: Secondary | ICD-10-CM | POA: Diagnosis not present

## 2019-09-15 DIAGNOSIS — N6320 Unspecified lump in the left breast, unspecified quadrant: Secondary | ICD-10-CM | POA: Diagnosis not present

## 2019-09-16 ENCOUNTER — Other Ambulatory Visit: Payer: Self-pay | Admitting: Family Medicine

## 2019-09-16 DIAGNOSIS — M7989 Other specified soft tissue disorders: Secondary | ICD-10-CM

## 2019-09-18 NOTE — Progress Notes (Signed)
Electrophysiology Office Note Date: 09/22/2019  ID:  Dylan Zimmerman, DOB 10-03-1940, MRN [id number]  PCP: Lujean Amel, MD Primary Cardiologist: Shirlee More, MD Electrophysiologist: Constance Haw, MD   CC: ILR follow-up  Dylan Zimmerman is a 79 y.o. male seen today for Dr. Curt Bears . he presents today for routine electrophysiology followup.  Since last being seen in our clinic, the patient reports doing very well. He has had a painful left breast lump for approx 6 weeks and is pending mammogram and [place]. Otherwise he is without complaint.  he denies chest pain, palpitations, dyspnea, PND, orthopnea, nausea, vomiting, dizziness, syncope, edema, weight gain, or early satiety.  Device History: St. Jude loop recorder implanted 10/04/2018 for syncope  Past Medical History:  Diagnosis Date  . Chronic cholecystitis   . Essential hypertension 06/18/2018  . Hyperlipidemia   . Hyperlipidemia   . Hypertension   . Obesity 06/18/2018  . Palpitations 06/18/2018  . Syncope and collapse    Past Surgical History:  Procedure Laterality Date  . CATARACT EXTRACTION Bilateral   . CHOLECYSTECTOMY N/A 04/21/2019   Procedure: LAPAROSCOPIC CHOLECYSTECTOMY WITH INTRAOPERATIVE CHOLANGIOGRAM;  Surgeon: Armandina Gemma, MD;  Location: [place];  Service: General;  Laterality: N/A;  . LITHOTRIPSY    . LOOP RECORDER INSERTION N/A 10/04/2018   Procedure: LOOP RECORDER INSERTION;  Surgeon: Constance Haw, MD;  Location: [place] [facility];  Service: Cardiovascular;  Laterality: N/A;  . TONSILLECTOMY      Current Outpatient Medications  Medication Sig Dispense Refill  . Ascorbic Acid (VITAMIN C) 500 MG CAPS Take by mouth.    Marland Kitchen aspirin EC 81 MG tablet Take 81 mg by mouth daily.    Marland Kitchen atorvastatin (LIPITOR) 10 MG tablet Take 10 mg by mouth daily.    . cholecalciferol (VITAMIN D3) 25 MCG (1000 UNIT) tablet Take 1,000 Units by mouth daily.    . hydrochlorothiazide (HYDRODIURIL) 25 MG tablet  Take 1 tablet (25 mg total) by mouth daily. 90 tablet 1  . Multiple Vitamin (MULTIVITAMIN) tablet Take 1 tablet by mouth daily.    . Omega-3 Fatty Acids (FISH OIL) 1000 MG CAPS Take by mouth.    Marland Kitchen SHINGRIX injection     . sildenafil (REVATIO) 20 MG tablet Take 1 tablet by mouth as needed.    . vitamin B-12 (CYANOCOBALAMIN) 100 MCG tablet Take 100 mcg by mouth daily.    Marland Kitchen zolpidem (AMBIEN) 10 MG tablet Take 10 mg by mouth at bedtime as needed for sleep.     Marland Kitchen diclofenac (VOLTAREN) 75 MG EC tablet 1 TABLET TWICE A DAY FOR THE NEXT 5 DAYS, THEN AS NEEDED ORALLY 10 DAYS     No current facility-administered medications for this visit.    Allergies:   Shellfish allergy, Bactrim [sulfamethoxazole-trimethoprim], and Iodine   Social History: Social History   Socioeconomic History  . Marital status: Married    Spouse name: Not on file  . Number of children: Not on file  . Years of education: Not on file  . Highest education level: Not on file  Occupational History  . Not on file  Tobacco Use  . Smoking status: Never Smoker  . Smokeless tobacco: Never Used  Vaping Use  . Vaping Use: Never used  Substance and Sexual Activity  . Alcohol use: Never  . Drug use: Never  . Sexual activity: Yes  Other Topics Concern  . Not on file  Social History Narrative  . Not on file  Social Determinants of Health   Financial Resource Strain:   . Difficulty of Paying Living Expenses:   Food Insecurity:   . Worried About Charity fundraiser in the Last Year:   . Arboriculturist in the Last Year:   Transportation Needs:   . Film/video editor (Medical):   Marland Kitchen Lack of Transportation (Non-Medical):   Physical Activity:   . Days of Exercise per Week:   . Minutes of Exercise per Session:   Stress:   . Feeling of Stress :   Social Connections:   . Frequency of Communication with Friends and Family:   . Frequency of Social Gatherings with Friends and Family:   . Attends Religious Services:   .  Active Member of Clubs or Organizations:   . Attends Archivist Meetings:   Marland Kitchen Marital Status:   Intimate Partner Violence:   . Fear of Current or Ex-Partner:   . Emotionally Abused:   Marland Kitchen Physically Abused:   . Sexually Abused:     Family History: Family History  Problem Relation Age of Onset  . Cancer Mother   . Cancer Father   . Heart attack Maternal Grandfather   . Heart disease Paternal Grandmother   . Heart attack Paternal Grandmother   . Heart disease Paternal Grandfather      Review of Systems: All other systems reviewed and are otherwise negative except as noted above.  Physical Exam: Vitals:   09/22/19 1051  BP: (!) 142/78  Pulse: 61  SpO2: 96%  Weight: 258 lb 6.4 oz (117.2 kg)  Height: 5\' 11"  (1.803 m)     GEN- The patient is well appearing, alert and oriented x 3 today.   HEENT: normocephalic, atraumatic; sclera clear, conjunctiva pink; hearing intact; oropharynx clear; neck supple  Lungs- Clear to ausculation bilaterally, normal work of breathing.  No wheezes, rales, rhonchi Heart- Regular rate and rhythm, no murmurs, rubs or gallops  GI- soft, non-tender, non-distended, bowel sounds present  Extremities- no clubbing, cyanosis, or edema  MS- no significant deformity or atrophy Skin- warm and dry, no rash or lesion; PPM pocket well healed Psych- euthymic mood, full affect Neuro- strength and sensation are intact  PPM Interrogation- reviewed in detail today,  See PACEART report  EKG:  EKG is ordered today. Personal review shows NSR at 61 bpm   Recent Labs: 09/24/2018: ALT 19 04/17/2019: BUN 15; Creatinine, Ser 1.06; Potassium 4.3; Sodium 140   Wt Readings from Last 3 Encounters:  09/22/19 258 lb 6.4 oz (117.2 kg)  06/03/19 249 lb (112.9 kg)  04/21/19 247 lb 9.2 oz (112.3 kg)     Other studies Reviewed: Additional studies/ records that were reviewed today include: Echo 08/2018 shows LVEF 60-65%, Previous EP office notes, Previous remote  checks, Most recent labwork.   Assessment and Plan:  1. Syncope s/p St. Jude Loop recorder Normal device function See Pace Art report No changes today  2. HTN Continue current regimen. Has been labile in the past.  Sees Dr. Bettina Gavia 11/2019.  3. PVCs Low burden overall No changes  4. Left breast lump Work up ongoing.  Confirmed with St. Jude that no issue with mammogram and loop recorder.  Continue to follow ILR. RTC 6 months. Sooner with symptoms.   Current medicines are reviewed at length with the patient today.   The patient does not have concerns regarding his medicines.  The following changes were made today:  none  Labs/ tests ordered today include:  Orders Placed This Encounter  Procedures  . CUP PACEART Fr[place]  . EKG 12-Lead     Disposition:   Follow up with Dr. Curt Bears in 6 Months    Signed, Annamaria Helling  09/22/2019 11:28 AM  [place] [address] [place] [address] [phone] (office) [phone] (fax)

## 2019-09-22 ENCOUNTER — Other Ambulatory Visit: Payer: Self-pay

## 2019-09-22 ENCOUNTER — Encounter: Payer: Self-pay | Admitting: Student

## 2019-09-22 ENCOUNTER — Ambulatory Visit (INDEPENDENT_AMBULATORY_CARE_PROVIDER_SITE_OTHER): Payer: Medicare PPO | Admitting: Student

## 2019-09-22 VITALS — BP 142/78 | HR 61 | Ht 71.0 in | Wt 258.4 lb

## 2019-09-22 DIAGNOSIS — I10 Essential (primary) hypertension: Secondary | ICD-10-CM | POA: Diagnosis not present

## 2019-09-22 DIAGNOSIS — I447 Left bundle-branch block, unspecified: Secondary | ICD-10-CM

## 2019-09-22 DIAGNOSIS — R55 Syncope and collapse: Secondary | ICD-10-CM | POA: Diagnosis not present

## 2019-09-22 LAB — CUP PACEART INCLINIC DEVICE CHECK
Date Time Interrogation Session: 20210628112251
Implantable Pulse Generator Implant Date: 20200710
Pulse Gen Serial Number: 9515998

## 2019-09-22 NOTE — Patient Instructions (Signed)
Medication Instructions:  *If you need a refill on your cardiac medications before your next appointment, please call your pharmacy*  Lab Work: If you have labs (blood work) drawn today and your tests are completely normal, you will receive your results only by: Marland Kitchen MyChart Message (if you have MyChart) OR . A paper copy in the mail If you have any lab test that is abnormal or we need to change your treatment, we will call you to review the results.  Testing/Procedures: None Ordered  Follow-Up: At [facility], you and your health needs are our priority.  As part of our continuing mission to provide you with exceptional heart care, we have created designated Provider Care Teams.  These Care Teams include your primary Cardiologist (physician) and Advanced Practice Providers (APPs -  Physician Assistants and Nurse Practitioners) who all work together to provide you with the care you need, when you need it.  We recommend signing up for the patient portal called "MyChart".  Sign up information is provided on this After Visit Summary.  MyChart is used to connect with patients for Virtual Visits (Telemedicine).  Patients are able to view lab/test results, encounter notes, upcoming appointments, etc.  Non-urgent messages can be sent to your provider as well.   To learn more about what you can do with MyChart, go to [email or web address].    Your next appointment:   Your physician wants you to follow-up in: 49 MONTHS with Dr. Curt Bears. You will receive a reminder letter in the mail two months in advance. If you don't receive a letter, please call our office to schedule the follow-up appointment.  The format for your next appointment:   In Person with Allegra Lai, MD

## 2019-09-30 ENCOUNTER — Ambulatory Visit (INDEPENDENT_AMBULATORY_CARE_PROVIDER_SITE_OTHER): Payer: Medicare PPO | Admitting: *Deleted

## 2019-09-30 DIAGNOSIS — R55 Syncope and collapse: Secondary | ICD-10-CM | POA: Diagnosis not present

## 2019-09-30 LAB — CUP PACEART REMOTE DEVICE CHECK
Date Time Interrogation Session: 20210706020224
Implantable Pulse Generator Implant Date: 20200710
Pulse Gen Serial Number: 9515998

## 2019-10-02 ENCOUNTER — Ambulatory Visit: Payer: Medicare PPO

## 2019-10-02 ENCOUNTER — Ambulatory Visit
Admission: RE | Admit: 2019-10-02 | Discharge: 2019-10-02 | Disposition: A | Discharge status: Home / Self Care | Payer: Medicare PPO | Source: Ambulatory Visit | Attending: Family Medicine | Admitting: Family Medicine

## 2019-10-02 ENCOUNTER — Other Ambulatory Visit: Payer: Self-pay

## 2019-10-02 DIAGNOSIS — M7989 Other specified soft tissue disorders: Secondary | ICD-10-CM

## 2019-10-02 DIAGNOSIS — R928 Other abnormal and inconclusive findings on diagnostic imaging of breast: Secondary | ICD-10-CM | POA: Diagnosis not present

## 2019-10-02 NOTE — Progress Notes (Signed)
ILR

## 2019-10-14 ENCOUNTER — Telehealth: Payer: Self-pay

## 2019-10-14 NOTE — Telephone Encounter (Signed)
Merlin alert received- . 1 symptom event, EGM shows SR w/ occ PVC  Spoke with pt, he reports that it was an accidental button push.  He denies having any symptoms.

## 2019-10-30 ENCOUNTER — Ambulatory Visit (INDEPENDENT_AMBULATORY_CARE_PROVIDER_SITE_OTHER): Payer: Medicare PPO | Admitting: *Deleted

## 2019-10-30 DIAGNOSIS — R55 Syncope and collapse: Secondary | ICD-10-CM | POA: Diagnosis not present

## 2019-10-31 LAB — CUP PACEART REMOTE DEVICE CHECK
Date Time Interrogation Session: 20210806020301
Implantable Pulse Generator Implant Date: 20200710
Pulse Gen Serial Number: 9515998

## 2019-11-03 NOTE — Progress Notes (Signed)
ILR

## 2019-11-04 DIAGNOSIS — Z79899 Other long term (current) drug therapy: Secondary | ICD-10-CM | POA: Diagnosis not present

## 2019-11-04 DIAGNOSIS — I10 Essential (primary) hypertension: Secondary | ICD-10-CM | POA: Diagnosis not present

## 2019-11-04 DIAGNOSIS — H9313 Tinnitus, bilateral: Secondary | ICD-10-CM | POA: Diagnosis not present

## 2019-11-04 DIAGNOSIS — R29898 Other symptoms and signs involving the musculoskeletal system: Secondary | ICD-10-CM | POA: Diagnosis not present

## 2019-11-04 DIAGNOSIS — G4700 Insomnia, unspecified: Secondary | ICD-10-CM | POA: Diagnosis not present

## 2019-12-02 ENCOUNTER — Ambulatory Visit (INDEPENDENT_AMBULATORY_CARE_PROVIDER_SITE_OTHER): Payer: Medicare PPO | Admitting: *Deleted

## 2019-12-02 DIAGNOSIS — R55 Syncope and collapse: Secondary | ICD-10-CM | POA: Diagnosis not present

## 2019-12-02 LAB — CUP PACEART REMOTE DEVICE CHECK
Date Time Interrogation Session: 20210907020350
Implantable Pulse Generator Implant Date: 20200710
Pulse Gen Serial Number: 9515998

## 2019-12-04 NOTE — Progress Notes (Signed)
ILR

## 2019-12-09 ENCOUNTER — Ambulatory Visit: Payer: Medicare PPO | Admitting: Cardiology

## 2019-12-09 DIAGNOSIS — H90A32 Mixed conductive and sensorineural hearing loss, unilateral, left ear with restricted hearing on the contralateral side: Secondary | ICD-10-CM | POA: Diagnosis not present

## 2019-12-09 DIAGNOSIS — H90A21 Sensorineural hearing loss, unilateral, right ear, with restricted hearing on the contralateral side: Secondary | ICD-10-CM | POA: Diagnosis not present

## 2019-12-29 DIAGNOSIS — Z1159 Encounter for screening for other viral diseases: Secondary | ICD-10-CM | POA: Diagnosis not present

## 2020-01-01 ENCOUNTER — Ambulatory Visit (INDEPENDENT_AMBULATORY_CARE_PROVIDER_SITE_OTHER): Payer: Medicare PPO

## 2020-01-01 DIAGNOSIS — R55 Syncope and collapse: Secondary | ICD-10-CM | POA: Diagnosis not present

## 2020-01-01 DIAGNOSIS — D120 Benign neoplasm of cecum: Secondary | ICD-10-CM | POA: Diagnosis not present

## 2020-01-01 DIAGNOSIS — Z86010 Personal history of colonic polyps: Secondary | ICD-10-CM | POA: Diagnosis not present

## 2020-01-01 DIAGNOSIS — K648 Other hemorrhoids: Secondary | ICD-10-CM | POA: Diagnosis not present

## 2020-01-01 DIAGNOSIS — K5730 Diverticulosis of large intestine without perforation or abscess without bleeding: Secondary | ICD-10-CM | POA: Diagnosis not present

## 2020-01-01 DIAGNOSIS — Z800 Family history of malignant neoplasm of digestive organs: Secondary | ICD-10-CM | POA: Diagnosis not present

## 2020-01-01 DIAGNOSIS — D123 Benign neoplasm of transverse colon: Secondary | ICD-10-CM | POA: Diagnosis not present

## 2020-01-01 DIAGNOSIS — K635 Polyp of colon: Secondary | ICD-10-CM

## 2020-01-01 HISTORY — DX: Polyp of colon: K63.5

## 2020-01-02 LAB — CUP PACEART REMOTE DEVICE CHECK
Date Time Interrogation Session: 20211008020335
Implantable Pulse Generator Implant Date: 20200710
Pulse Gen Serial Number: 9515998

## 2020-01-05 NOTE — Progress Notes (Signed)
ILR

## 2020-01-06 DIAGNOSIS — D120 Benign neoplasm of cecum: Secondary | ICD-10-CM | POA: Diagnosis not present

## 2020-01-06 DIAGNOSIS — D123 Benign neoplasm of transverse colon: Secondary | ICD-10-CM | POA: Diagnosis not present

## 2020-01-13 DIAGNOSIS — I10 Essential (primary) hypertension: Secondary | ICD-10-CM | POA: Insufficient documentation

## 2020-01-13 DIAGNOSIS — K811 Chronic cholecystitis: Secondary | ICD-10-CM | POA: Insufficient documentation

## 2020-01-13 NOTE — Progress Notes (Signed)
Cardiology Office Note:    Date:  01/14/2020   ID:  Dylan Zimmerman, DOB 09-07-1940, MRN [id number]  PCP:  Lujean Amel, MD  Cardiologist:  Shirlee More, MD    Referring MD: Lujean Amel, MD    ASSESSMENT:    1. Syncope and collapse   2. Essential hypertension   3. PVC's (premature ventricular contractions)   4. Mixed hyperlipidemia    PLAN:    In order of problems listed above:  1. He has had no recurrence in retrospect we both think he had symptomatic orthostatic hypotension with ACE inhibitor. I cautioned to continue to check his blood pressure at home stop his calcium channel blocker if he is getting systolics less than 937 and urged physicians in the future with additional antihypertensive agents to check his standing blood pressure. We will renew his thiazide diuretic check renal function potassium. 2. Stable continue current treatment and home monitoring 3. Stable asymptomatic 4. Continue a statin recheck liver function lipid profile today 5. He also requested hemoglobin A1c he tells me his blood sugar has been elevated consistent with prediabetes   Next appointment: 6 months at his request   Medication Adjustments/Labs and Tests Ordered: Current medicines are reviewed at length with the patient today.  Concerns regarding medicines are outlined above.  Orders Placed This Encounter  Procedures  . Comprehensive metabolic panel  . Lipid panel  . HgB A1c   Meds ordered this encounter  Medications  . hydrochlorothiazide (HYDRODIURIL) 25 MG tablet    Sig: Take 1 tablet (25 mg total) by mouth daily.    Dispense:  90 tablet    Refill:  1    Chief Complaint  Patient presents with  . Follow-up  . Loss of Consciousness    He has an implanted loop recorder  . Hypertension    History of Present Illness:    Dylan Zimmerman is a 79 y.o. male with a hx of hypertension syncope, PVCs and left bundle branch block  last seen 09/24/2018 and referred for an ILR implanted  10/04/2018. He had a gated pull study myocardial perfusion performed 08/27/2018 EF 64% no evidence of ischemia and echocardiogram was performed showing EF 60 to 65% mild concentric LVH no evidence of preceding myocardial infarction and no significant valvular heart disease. When seen initially he had orthostatic hypotension. He was yes last seen 06/03/2019. I reviewed all of his downloads since the last visit and there is no significant arrhythmia and no bradycardia. Compliance with diet, lifestyle and medications: Yes  In the interim he was placed on low-dose amlodipine 2 and half milligrams daily with systolics greater than 902. Home blood pressure tends to run in the 140/60 range repeat by me in the office 136/80 sitting and standing. I told him if he gets systolics of less than 409 at home consistently to stop his calcium channel blocker. He has a good quality of life he has had no recurrent syncope lightheadedness chest pain shortness of breath or palpitation. He recently had colonoscopy with multiple polyps removed. He had no bleeding complication. He tolerates his statin without muscle pain or weakness. His last lipid profile performed in February and we will recheck labs today.  05/08/2019: Cholesterol 155 LDL 75 triglycerides 249 HDL 39 random glucose 123 creatinine 0.99 Past Medical History:  Diagnosis Date  . Back pain at L4-L5 level 12/07/2018  . Cholelithiasis with chronic cholecystitis 04/20/2019  . Chronic cholecystitis   . Colorectal polyp detected on colonoscopy 01/01/2020  . Elevated  liver enzymes 04/20/2019  . Essential hypertension 06/18/2018  . Hyperlipidemia   . Hyperlipidemia   . Hypertension   . Obesity 06/18/2018  . Palpitations 06/18/2018  . PVC's (premature ventricular contractions) 08/21/2018  . Syncope and collapse     Past Surgical History:  Procedure Laterality Date  . CATARACT EXTRACTION Bilateral   . CHOLECYSTECTOMY N/A 04/21/2019   Procedure: LAPAROSCOPIC  CHOLECYSTECTOMY WITH INTRAOPERATIVE CHOLANGIOGRAM;  Surgeon: Armandina Gemma, MD;  Location: [place];  Service: General;  Laterality: N/A;  . LITHOTRIPSY    . LOOP RECORDER INSERTION N/A 10/04/2018   Procedure: LOOP RECORDER INSERTION;  Surgeon: Constance Haw, MD;  Location: [place] [facility];  Service: Cardiovascular;  Laterality: N/A;  . TONSILLECTOMY      Current Medications: Current Meds  Medication Sig  . amLODipine (NORVASC) 2.5 MG tablet Take 2.5 mg by mouth daily.  . Ascorbic Acid (VITAMIN C) 500 MG CAPS Take by mouth.  Marland Kitchen aspirin EC 81 MG tablet Take 81 mg by mouth daily.  Marland Kitchen atorvastatin (LIPITOR) 10 MG tablet Take 10 mg by mouth daily.  . cholecalciferol (VITAMIN D3) 25 MCG (1000 UNIT) tablet Take 1,000 Units by mouth daily.  . fluticasone (FLONASE) 50 MCG/ACT nasal spray Place into both nostrils.  . Glucosamine-Chondroit-Vit C-Mn (GLUCOSAMINE 1500 COMPLEX PO) Take 1 tablet by mouth daily.  . hydrochlorothiazide (HYDRODIURIL) 25 MG tablet Take 1 tablet (25 mg total) by mouth daily.  . Multiple Vitamin (MULTIVITAMIN) tablet Take 1 tablet by mouth daily.  . Omega-3 Fatty Acids (FISH OIL) 1000 MG CAPS Take by mouth.  Marland Kitchen SHINGRIX injection   . sildenafil (REVATIO) 20 MG tablet Take 1 tablet by mouth as needed.  . vitamin B-12 (CYANOCOBALAMIN) 500 MCG tablet Take 500 mcg by mouth daily.   Marland Kitchen zolpidem (AMBIEN) 10 MG tablet Take 10 mg by mouth at bedtime as needed for sleep.   . [DISCONTINUED] hydrochlorothiazide (HYDRODIURIL) 25 MG tablet Take 1 tablet (25 mg total) by mouth daily.     Allergies:   Shellfish allergy, Bactrim [sulfamethoxazole-trimethoprim], and Iodine   Social History   Socioeconomic History  . Marital status: Married    Spouse name: Not on file  . Number of children: Not on file  . Years of education: Not on file  . Highest education level: Not on file  Occupational History  . Not on file  Tobacco Use  . Smoking status: Never Smoker  .  Smokeless tobacco: Never Used  Vaping Use  . Vaping Use: Never used  Substance and Sexual Activity  . Alcohol use: Never  . Drug use: Never  . Sexual activity: Yes  Other Topics Concern  . Not on file  Social History Narrative  . Not on file   Social Determinants of Health   Financial Resource Strain:   . Difficulty of Paying Living Expenses: Not on file  Food Insecurity:   . Worried About Charity fundraiser in the Last Year: Not on file  . Ran Out of Food in the Last Year: Not on file  Transportation Needs:   . Lack of Transportation (Medical): Not on file  . Lack of Transportation (Non-Medical): Not on file  Physical Activity:   . Days of Exercise per Week: Not on file  . Minutes of Exercise per Session: Not on file  Stress:   . Feeling of Stress : Not on file  Social Connections:   . Frequency of Communication with Friends and Family: Not on file  .  Frequency of Social Gatherings with Friends and Family: Not on file  . Attends Religious Services: Not on file  . Active Member of Clubs or Organizations: Not on file  . Attends Archivist Meetings: Not on file  . Marital Status: Not on file     Family History: The patient's family history includes Breast cancer in his mother; Cancer in his father and mother; Heart attack in his maternal grandfather and paternal grandmother; Heart disease in his paternal grandfather and paternal grandmother. ROS:   Please see the history of present illness.    All other systems reviewed and are negative.  EKGs/Labs/Other Studies Reviewed:    The following studies were reviewed today:  Recent Labs: 04/17/2019: BUN 15; Creatinine, Ser 1.06; Potassium 4.3; Sodium 140  Recent Lipid Panel    Component Value Date/Time   CHOL 149 10/01/2018 1016   TRIG 263 (H) 10/01/2018 1016   HDL 35 (L) 10/01/2018 1016   CHOLHDL 4.3 10/01/2018 1016   LDLCALC 61 10/01/2018 1016    Physical Exam:    VS:  BP (!) 148/60   Pulse 64   Ht 5'  11" (1.803 m)   Wt 258 lb 1.9 oz (117.1 kg)   SpO2 96%   BMI 36.00 kg/m     Wt Readings from Last 3 Encounters:  01/14/20 258 lb 1.9 oz (117.1 kg)  09/22/19 258 lb 6.4 oz (117.2 kg)  06/03/19 249 lb (112.9 kg)     GEN:  Well nourished, well developed in no acute distress HEENT: Normal NECK: No JVD; No carotid bruits LYMPHATICS: No lymphadenopathy CARDIAC: RRR, no murmurs, rubs, gallops RESPIRATORY:  Clear to auscultation without rales, wheezing or rhonchi  ABDOMEN: Soft, non-tender, non-distended MUSCULOSKELETAL:  No edema; No deformity  SKIN: Warm and dry NEUROLOGIC:  Alert and oriented x 3 PSYCHIATRIC:  Normal affect    Signed, Shirlee More, MD  01/14/2020 9:31 AM    [place]

## 2020-01-14 ENCOUNTER — Encounter: Payer: Self-pay | Admitting: Cardiology

## 2020-01-14 ENCOUNTER — Ambulatory Visit (INDEPENDENT_AMBULATORY_CARE_PROVIDER_SITE_OTHER): Payer: Medicare PPO | Admitting: Cardiology

## 2020-01-14 ENCOUNTER — Other Ambulatory Visit: Payer: Self-pay

## 2020-01-14 VITALS — BP 148/60 | HR 64 | Ht 71.0 in | Wt 258.1 lb

## 2020-01-14 DIAGNOSIS — I493 Ventricular premature depolarization: Secondary | ICD-10-CM | POA: Diagnosis not present

## 2020-01-14 DIAGNOSIS — I10 Essential (primary) hypertension: Secondary | ICD-10-CM

## 2020-01-14 DIAGNOSIS — R55 Syncope and collapse: Secondary | ICD-10-CM

## 2020-01-14 DIAGNOSIS — E782 Mixed hyperlipidemia: Secondary | ICD-10-CM

## 2020-01-14 MED ORDER — HYDROCHLOROTHIAZIDE 25 MG PO TABS: 25.0000 mg | ORAL_TABLET | Freq: Every day | ORAL | 1 refills | Status: DC

## 2020-01-14 NOTE — Patient Instructions (Signed)
Medication Instructions:  Your physician recommends that you continue on your current medications as directed. Please refer to the Current Medication list given to you today.  If you are consistently seeing that your blood pressure is less than 130 on top please stop taking your amlodipine. *If you need a refill on your cardiac medications before your next appointment, please call your pharmacy*   Lab Work: Your physician recommends that you return for lab work in: TODAY CMP, Lipids, HGB A1C If you have labs (blood work) drawn today and your tests are completely normal, you will receive your results only by: Marland Kitchen MyChart Message (if you have MyChart) OR . A paper copy in the mail If you have any lab test that is abnormal or we need to change your treatment, we will call you to review the results.   Testing/Procedures: None   Follow-Up: At [facility], you and your health needs are our priority.  As part of our continuing mission to provide you with exceptional heart care, we have created designated Provider Care Teams.  These Care Teams include your primary Cardiologist (physician) and Advanced Practice Providers (APPs -  Physician Assistants and Nurse Practitioners) who all work together to provide you with the care you need, when you need it.  We recommend signing up for the patient portal called "MyChart".  Sign up information is provided on this After Visit Summary.  MyChart is used to connect with patients for Virtual Visits (Telemedicine).  Patients are able to view lab/test results, encounter notes, upcoming appointments, etc.  Non-urgent messages can be sent to your provider as well.   To learn more about what you can do with MyChart, go to [email or web address].    Your next appointment:   6 month(s)  The format for your next appointment:   In Person  Provider:   Shirlee More, MD   Other Instructions

## 2020-01-15 ENCOUNTER — Telehealth: Payer: Self-pay

## 2020-01-15 LAB — COMPREHENSIVE METABOLIC PANEL
ALT: 19 IU/L (ref 0–44)
AST: 23 IU/L (ref 0–40)
Albumin/Globulin Ratio: 1.8 (ref 1.2–2.2)
Albumin: 4.3 g/dL (ref 3.7–4.7)
Alkaline Phosphatase: 84 IU/L (ref 44–121)
BUN/Creatinine Ratio: 17 (ref 10–24)
BUN: 17 mg/dL (ref 8–27)
Bilirubin Total: 0.5 mg/dL (ref 0.0–1.2)
CO2: 22 mmol/L (ref 20–29)
Calcium: 9.7 mg/dL (ref 8.6–10.2)
Chloride: 104 mmol/L (ref 96–106)
Creatinine, Ser: 1.03 mg/dL (ref 0.76–1.27)
GFR calc Af Amer: 79 mL/min/{1.73_m2} (ref >59)
GFR calc non Af Amer: 68 mL/min/{1.73_m2} (ref >59)
Globulin, Total: 2.4 g/dL (ref 1.5–4.5)
Glucose: 191 mg/dL — ABNORMAL HIGH (ref 65–99)
Potassium: 4.3 mmol/L (ref 3.5–5.2)
Sodium: 144 mmol/L (ref 134–144)
Total Protein: 6.7 g/dL (ref 6.0–8.5)

## 2020-01-15 LAB — LIPID PANEL
Chol/HDL Ratio: 4.4 ratio (ref 0.0–5.0)
Cholesterol, Total: 163 mg/dL (ref 100–199)
HDL: 37 mg/dL — ABNORMAL LOW (ref >39)
LDL Chol Calc (NIH): 74 mg/dL (ref 0–99)
Triglycerides: 322 mg/dL — ABNORMAL HIGH (ref 0–149)
VLDL Cholesterol Cal: 52 mg/dL — ABNORMAL HIGH (ref 5–40)

## 2020-01-15 LAB — HEMOGLOBIN A1C
Est. average glucose Bld gHb Est-mCnc: 134 mg/dL
Hgb A1c MFr Bld: 6.3 % — ABNORMAL HIGH (ref 4.8–5.6)

## 2020-01-15 NOTE — Telephone Encounter (Signed)
-----   Message from Richardo Priest, MD sent at 01/15/2020  7:43 AM EDT ----- This was nonfasting  LDL cholesterol at goal  A1c is elevated prediabetes range

## 2020-01-15 NOTE — Telephone Encounter (Signed)
Spoke with patient regarding results and recommendation.  Patient verbalizes understanding and is agreeable to plan of care. Advised patient to call back with any issues or concerns.  

## 2020-01-19 DIAGNOSIS — L820 Inflamed seborrheic keratosis: Secondary | ICD-10-CM | POA: Diagnosis not present

## 2020-01-19 DIAGNOSIS — D692 Other nonthrombocytopenic purpura: Secondary | ICD-10-CM | POA: Diagnosis not present

## 2020-01-19 DIAGNOSIS — L980 Pyogenic granuloma: Secondary | ICD-10-CM | POA: Diagnosis not present

## 2020-01-19 DIAGNOSIS — Z85828 Personal history of other malignant neoplasm of skin: Secondary | ICD-10-CM | POA: Diagnosis not present

## 2020-01-19 DIAGNOSIS — D485 Neoplasm of uncertain behavior of skin: Secondary | ICD-10-CM | POA: Diagnosis not present

## 2020-01-19 DIAGNOSIS — D1801 Hemangioma of skin and subcutaneous tissue: Secondary | ICD-10-CM | POA: Diagnosis not present

## 2020-01-19 DIAGNOSIS — L821 Other seborrheic keratosis: Secondary | ICD-10-CM | POA: Diagnosis not present

## 2020-01-19 DIAGNOSIS — L814 Other melanin hyperpigmentation: Secondary | ICD-10-CM | POA: Diagnosis not present

## 2020-01-19 DIAGNOSIS — D225 Melanocytic nevi of trunk: Secondary | ICD-10-CM | POA: Diagnosis not present

## 2020-02-02 ENCOUNTER — Ambulatory Visit (INDEPENDENT_AMBULATORY_CARE_PROVIDER_SITE_OTHER): Payer: Medicare PPO

## 2020-02-02 DIAGNOSIS — R55 Syncope and collapse: Secondary | ICD-10-CM

## 2020-02-03 LAB — CUP PACEART REMOTE DEVICE CHECK
Date Time Interrogation Session: 20211108022246
Implantable Pulse Generator Implant Date: 20200710
Pulse Gen Serial Number: 9515998

## 2020-02-04 NOTE — Progress Notes (Signed)
ILR 

## 2020-03-04 ENCOUNTER — Ambulatory Visit (INDEPENDENT_AMBULATORY_CARE_PROVIDER_SITE_OTHER): Payer: Medicare PPO

## 2020-03-04 DIAGNOSIS — R55 Syncope and collapse: Secondary | ICD-10-CM | POA: Diagnosis not present

## 2020-03-05 LAB — CUP PACEART REMOTE DEVICE CHECK
Date Time Interrogation Session: 20211209020237
Implantable Pulse Generator Implant Date: 20200710
Pulse Gen Serial Number: 9515998

## 2020-03-17 NOTE — Progress Notes (Signed)
ILR

## 2020-04-05 ENCOUNTER — Encounter: Payer: Self-pay | Admitting: Cardiology

## 2020-04-05 ENCOUNTER — Ambulatory Visit (INDEPENDENT_AMBULATORY_CARE_PROVIDER_SITE_OTHER): Payer: Medicare PPO

## 2020-04-05 ENCOUNTER — Other Ambulatory Visit: Payer: Self-pay

## 2020-04-05 ENCOUNTER — Ambulatory Visit (INDEPENDENT_AMBULATORY_CARE_PROVIDER_SITE_OTHER): Payer: Medicare PPO | Admitting: Cardiology

## 2020-04-05 VITALS — BP 142/78 | HR 60 | Ht 71.0 in | Wt 250.0 lb

## 2020-04-05 DIAGNOSIS — R55 Syncope and collapse: Secondary | ICD-10-CM | POA: Diagnosis not present

## 2020-04-05 LAB — CUP PACEART REMOTE DEVICE CHECK
Date Time Interrogation Session: 20220110020650
Implantable Pulse Generator Implant Date: 20200710
Pulse Gen Serial Number: 9515998

## 2020-04-05 LAB — CUP PACEART INCLINIC DEVICE CHECK
Date Time Interrogation Session: 20220110163538
Implantable Pulse Generator Implant Date: 20200710
Pulse Gen Serial Number: 9515998

## 2020-04-05 NOTE — Patient Instructions (Signed)
Medication Instructions:  Your physician recommends that you continue on your current medications as directed. Please refer to the Current Medication list given to you today.  *If you need a refill on your cardiac medications before your next appointment, please call your pharmacy*   Lab Work: None ordered If you have labs (blood work) drawn today and your tests are completely normal, you will receive your results only by: Marland Kitchen MyChart Message (if you have MyChart) OR . A paper copy in the mail If you have any lab test that is abnormal or we need to change your treatment, we will call you to review the results.   Testing/Procedures: None ordered   Follow-Up: At [facility], you and your health needs are our priority.  As part of our continuing mission to provide you with exceptional heart care, we have created designated Provider Care Teams.  These Care Teams include your primary Cardiologist (physician) and Advanced Practice Providers (APPs -  Physician Assistants and Nurse Practitioners) who all work together to provide you with the care you need, when you need it.  Remote monitoring is used to monitor your Pacemaker or ICD from home. This monitoring reduces the number of office visits required to check your device to one time per year. It allows [place] to keep an eye on the functioning of your device to ensure it is working properly. You are scheduled for a device check from home on 05/10/20. You may send your transmission at any time that day. If you have a wireless device, the transmission will be sent automatically. After your physician reviews your transmission, you will receive a postcard with your next transmission date.  Your next appointment:   1 year(s)  The format for your next appointment:   In Person  Provider:   Allegra Lai, MD   Thank you for choosing [facility]!!   Trinidad Curet, RN [phone]

## 2020-04-05 NOTE — Progress Notes (Signed)
Electrophysiology Office Note   Date:  04/05/2020   ID:  Dylan Zimmerman, DOB 17-Oct-1940, MRN [id number]  PCP:  Lujean Amel, MD  Cardiologist:  Bettina Gavia Primary Electrophysiologist:  Raeya Merritts Meredith Leeds, MD    Chief Complaint: Pacemaker   History of Present Illness: Dylan Zimmerman is a 80 y.o. male who is being seen today for the evaluation of pacemaker at the request of Koirala, Dibas, MD. Presenting today for electrophysiology evaluation.  He has a history significant for hypertension, hyperlipidemia, obesity, PVCs, and syncope.  He is working outside in the [employer] and felt that he would faint.  He is now status post Linq monitor implant 10/04/2018.  Today, he denies symptoms of palpitations, chest pain, shortness of breath, orthopnea, PND, lower extremity edema, claudication, dizziness, presyncope, syncope, bleeding, or neurologic sequela. The patient is tolerating medications without difficulties.  Since last being seen he has done well.  He has no chest pain or shortness of breath.  He is a do all of his daily activities.  He has not had any further episodes of syncope.  Past Medical History:  Diagnosis Date  . Back pain at L4-L5 level 12/07/2018  . Cholelithiasis with chronic cholecystitis 04/20/2019  . Chronic cholecystitis   . Colorectal polyp detected on colonoscopy 01/01/2020  . Elevated liver enzymes 04/20/2019  . Essential hypertension 06/18/2018  . Hyperlipidemia   . Hyperlipidemia   . Hypertension   . Obesity 06/18/2018  . Palpitations 06/18/2018  . PVC's (premature ventricular contractions) 08/21/2018  . Syncope and collapse    Past Surgical History:  Procedure Laterality Date  . CATARACT EXTRACTION Bilateral   . CHOLECYSTECTOMY N/A 04/21/2019   Procedure: LAPAROSCOPIC CHOLECYSTECTOMY WITH INTRAOPERATIVE CHOLANGIOGRAM;  Surgeon: Armandina Gemma, MD;  Location: [place];  Service: General;  Laterality: N/A;  . LITHOTRIPSY    . LOOP RECORDER INSERTION  N/A 10/04/2018   Procedure: LOOP RECORDER INSERTION;  Surgeon: Constance Haw, MD;  Location: [place] [facility];  Service: Cardiovascular;  Laterality: N/A;  . TONSILLECTOMY       Current Outpatient Medications  Medication Sig Dispense Refill  . amLODipine (NORVASC) 2.5 MG tablet Take 2.5 mg by mouth daily.    . Ascorbic Acid (VITAMIN C) 500 MG CAPS Take by mouth.    Marland Kitchen aspirin EC 81 MG tablet Take 81 mg by mouth daily.    Marland Kitchen atorvastatin (LIPITOR) 10 MG tablet Take 10 mg by mouth daily.    . cholecalciferol (VITAMIN D3) 25 MCG (1000 UNIT) tablet Take 1,000 Units by mouth daily.    . fluticasone (FLONASE) 50 MCG/ACT nasal spray Place into both nostrils.    . Glucosamine-Chondroit-Vit C-Mn (GLUCOSAMINE 1500 COMPLEX PO) Take 1 tablet by mouth daily.    . hydrochlorothiazide (HYDRODIURIL) 25 MG tablet Take 1 tablet (25 mg total) by mouth daily. 90 tablet 1  . Multiple Vitamin (MULTIVITAMIN) tablet Take 1 tablet by mouth daily.    . Omega-3 Fatty Acids (FISH OIL) 1000 MG CAPS Take by mouth.    . sildenafil (REVATIO) 20 MG tablet Take 1 tablet by mouth as needed.    . vitamin B-12 (CYANOCOBALAMIN) 500 MCG tablet Take 500 mcg by mouth daily.     Marland Kitchen zolpidem (AMBIEN) 10 MG tablet Take 10 mg by mouth at bedtime as needed for sleep.      No current facility-administered medications for this visit.    Allergies:   Shellfish allergy, Bactrim [sulfamethoxazole-trimethoprim], and Iodine   Social History:  The  patient  reports that he has never smoked. He has never used smokeless tobacco. He reports that he does not drink alcohol and does not use drugs.   Family History:  The patient's family history includes Breast cancer in his mother; Cancer in his father and mother; Heart attack in his maternal grandfather and paternal grandmother; Heart disease in his paternal grandfather and paternal grandmother.    ROS:  Please see the history of present illness.   Otherwise, review of systems is positive  for none.   All other systems are reviewed and negative.    PHYSICAL EXAM: VS:  BP (!) 142/78   Pulse 60   Ht 5\' 11"  (1.803 m)   Wt 250 lb (113.4 kg)   SpO2 97%   BMI 34.87 kg/m  , BMI Body mass index is 34.87 kg/m. GEN: Well nourished, well developed, in no acute distress  HEENT: normal  Neck: no JVD, carotid bruits, or masses Cardiac: RRR; no murmurs, rubs, or gallops,no edema  Respiratory:  clear to auscultation bilaterally, normal work of breathing GI: soft, nontender, nondistended, + BS MS: no deformity or atrophy  Skin: warm and dry, device pocket is well healed Neuro:  Strength and sensation are intact Psych: euthymic mood, full affect  EKG:  EKG is ordered today. Personal review of the ekg ordered shows sinus rhythm, left bundle branch block  Device interrogation is reviewed today in detail.  See PaceArt for details.   Recent Labs: 01/14/2020: ALT 19; BUN 17; Creatinine, Ser 1.03; Potassium 4.3; Sodium 144    Lipid Panel     Component Value Date/Time   CHOL 163 01/14/2020 0920   TRIG 322 (H) 01/14/2020 0920   HDL 37 (L) 01/14/2020 0920   CHOLHDL 4.4 01/14/2020 0920   LDLCALC 74 01/14/2020 0920     Wt Readings from Last 3 Encounters:  04/05/20 250 lb (113.4 kg)  01/14/20 258 lb 1.9 oz (117.1 kg)  09/22/19 258 lb 6.4 oz (117.2 kg)      Other studies Reviewed: Additional studies/ records that were reviewed today include: TTE 09/04/18  Review of the above records today demonstrates:  1. The left ventricle has normal systolic function with an ejection  fraction of 60-65%. The cavity size was normal. There is mild concentric  left ventricular hypertrophy. Left ventricular diastolic Doppler  parameters are consistent with impaired  relaxation. There is mildly abnormal septal motion consistent with left  bundle branch block.  2. The right ventricle has normal systolic function. The cavity was  mildly enlarged. There is no increase in right ventricular  wall thickness.  3. No evidence of mitral valve stenosis.  4. No stenosis of the aortic valve.  5. 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    ASSESSMENT AND PLAN:  1. recurrent syncope: Normal Myoview.  He does have an evidence of a left bundle branch block.  He is now status post Environmental health practitioner confirm monitor.  He has had no arrhythmias to cause syncope.  No changes.  He states that once the battery of his monitor runs out, he does not wish for it to be explanted.  2.  PVCs: Less than 1% on cardiac monitor.  No changes.  3.  Hypertension: Plan per primary cardiology and primary care.    Current medicines are reviewed at length with the patient today.   The patient does not have concerns regarding his medicines.  The following changes were made today:  none  Labs/ tests ordered today include:  Orders Placed This Encounter  Procedures  . EKG 12-Lead     Disposition:   FU with [facility] 1 year  Signed, Kaidyn Javid Meredith Leeds, MD  04/05/2020 2:47 PM     [facility] [address] [place] [address] [phone] (office) [phone] (fax)

## 2020-04-19 NOTE — Progress Notes (Signed)
ILR

## 2020-05-10 ENCOUNTER — Ambulatory Visit (INDEPENDENT_AMBULATORY_CARE_PROVIDER_SITE_OTHER): Payer: Medicare PPO

## 2020-05-10 DIAGNOSIS — Z79899 Other long term (current) drug therapy: Secondary | ICD-10-CM | POA: Diagnosis not present

## 2020-05-10 DIAGNOSIS — G4700 Insomnia, unspecified: Secondary | ICD-10-CM | POA: Diagnosis not present

## 2020-05-10 DIAGNOSIS — I10 Essential (primary) hypertension: Secondary | ICD-10-CM | POA: Diagnosis not present

## 2020-05-10 DIAGNOSIS — M6281 Muscle weakness (generalized): Secondary | ICD-10-CM | POA: Diagnosis not present

## 2020-05-10 DIAGNOSIS — E7800 Pure hypercholesterolemia, unspecified: Secondary | ICD-10-CM | POA: Diagnosis not present

## 2020-05-10 DIAGNOSIS — N529 Male erectile dysfunction, unspecified: Secondary | ICD-10-CM | POA: Diagnosis not present

## 2020-05-10 DIAGNOSIS — Z0001 Encounter for general adult medical examination with abnormal findings: Secondary | ICD-10-CM | POA: Diagnosis not present

## 2020-05-10 DIAGNOSIS — R55 Syncope and collapse: Secondary | ICD-10-CM

## 2020-05-10 DIAGNOSIS — Z125 Encounter for screening for malignant neoplasm of prostate: Secondary | ICD-10-CM | POA: Diagnosis not present

## 2020-05-10 DIAGNOSIS — R7309 Other abnormal glucose: Secondary | ICD-10-CM | POA: Diagnosis not present

## 2020-05-10 LAB — CUP PACEART REMOTE DEVICE CHECK
Date Time Interrogation Session: 20220214021601
Implantable Pulse Generator Implant Date: 20200710
Pulse Gen Serial Number: 9515998

## 2020-05-17 NOTE — Progress Notes (Signed)
ILR

## 2020-06-10 LAB — CUP PACEART REMOTE DEVICE CHECK
Date Time Interrogation Session: 20220317020757
Implantable Pulse Generator Implant Date: 20200710
Pulse Gen Serial Number: 9515998

## 2020-06-14 ENCOUNTER — Ambulatory Visit (INDEPENDENT_AMBULATORY_CARE_PROVIDER_SITE_OTHER): Payer: Medicare PPO

## 2020-06-14 ENCOUNTER — Telehealth: Payer: Self-pay | Admitting: Cardiology

## 2020-06-14 DIAGNOSIS — R55 Syncope and collapse: Secondary | ICD-10-CM

## 2020-06-14 NOTE — Telephone Encounter (Signed)
Patient scheduled to see Dr. Bettina Gavia on 06/15/20 at 8:00 am

## 2020-06-14 NOTE — Progress Notes (Signed)
Cardiology Office Note:    Date:  06/15/2020   ID:  Dylan Zimmerman, DOB 08-Nov-1940, MRN [id number]  PCP:  Lujean Amel, MD  Cardiologist:  Shirlee More, MD    Referring MD: Lujean Amel, MD    ASSESSMENT:    1. Syncope and collapse   2. Essential hypertension   3. Left bundle branch block   4. Mixed hyperlipidemia    PLAN:    In order of problems listed above:  1. Historically he had symptomatic orthostatic hypotension.  I have asked him to start checking his standing blood pressure daily and to contact me if his systolics less than 409 in view of his previous syncope.  His download device had false findings of pause in atrial fibrillation due to under sensing and PVCs. 2. Stable left bundle branch block without evidence of cardiomyopathy 3. Lipids at target continue statin   Next appointment: I will plan to see in 1 year we will follow in our device clinic and I advised him when he comes to end-of-life of his loop recorder I would simply leave it in and not explant.   Medication Adjustments/Labs and Tests Ordered: Current medicines are reviewed at length with the patient today.  Concerns regarding medicines are outlined above.  No orders of the defined types were placed in this encounter.  No orders of the defined types were placed in this encounter.   Chief Complaint  Patient presents with  . Follow-up  . Loss of Consciousness    He has an implanted loop recorder    History of Present Illness:    Dylan Zimmerman is a 80 y.o. male with a hx of hypertension syncope, PVCs orthostatic hypotension and left bundle branch block  seen 09/24/2018 and referred for an ILR implanted 10/04/2018 last seen 01/13/2021.  His loop recorder download suggested atrial fibrillation but on further review this was due to PVCs and apparent pauses due to under sensing and strip review.  Compliance with diet, lifestyle and medications: Yes  He was concerned because when he looked at his  loop recorder download CD impression is having atrial fibrillation and I discussed the case with his son-in-law who is a physician. He has had no palpitation no recurrent episodes of syncope. His systolics are running greater than 150 and he had increase in his calcium channel blocker now averages 146/73 pulse 53. He is not checking standing blood pressures and I asked him to do it twice daily. No chest pain or shortness of breath no edema from the calcium channel blocker Past Medical History:  Diagnosis Date  . Back pain at L4-L5 level 12/07/2018  . Cholelithiasis with chronic cholecystitis 04/20/2019  . Chronic cholecystitis   . Colorectal polyp detected on colonoscopy 01/01/2020  . Elevated liver enzymes 04/20/2019  . Essential hypertension 06/18/2018  . Hyperlipidemia   . Hyperlipidemia   . Hypertension   . Obesity 06/18/2018  . Palpitations 06/18/2018  . PVC's (premature ventricular contractions) 08/21/2018  . Syncope and collapse     Past Surgical History:  Procedure Laterality Date  . CATARACT EXTRACTION Bilateral   . CHOLECYSTECTOMY N/A 04/21/2019   Procedure: LAPAROSCOPIC CHOLECYSTECTOMY WITH INTRAOPERATIVE CHOLANGIOGRAM;  Surgeon: Armandina Gemma, MD;  Location: [place];  Service: General;  Laterality: N/A;  . LITHOTRIPSY    . LOOP RECORDER INSERTION N/A 10/04/2018   Procedure: LOOP RECORDER INSERTION;  Surgeon: Constance Haw, MD;  Location: [place] [facility];  Service: Cardiovascular;  Laterality: N/A;  . TONSILLECTOMY  Current Medications: Current Meds  Medication Sig  . amLODipine (NORVASC) 2.5 MG tablet Take 5 mg by mouth daily.  . Ascorbic Acid (VITAMIN C) 500 MG CAPS Take by mouth.  Marland Kitchen aspirin EC 81 MG tablet Take 81 mg by mouth daily.  Marland Kitchen atorvastatin (LIPITOR) 10 MG tablet Take 10 mg by mouth daily.  . cholecalciferol (VITAMIN D3) 25 MCG (1000 UNIT) tablet Take 1,000 Units by mouth daily.  . fluticasone (FLONASE) 50 MCG/ACT nasal spray Place  into both nostrils.  . Glucosamine-Chondroit-Vit C-Mn (GLUCOSAMINE 1500 COMPLEX PO) Take 1 tablet by mouth daily.  . hydrochlorothiazide (HYDRODIURIL) 25 MG tablet Take 1 tablet (25 mg total) by mouth daily.  . Multiple Vitamin (MULTIVITAMIN) tablet Take 1 tablet by mouth daily.  . Omega-3 Fatty Acids (FISH OIL) 1000 MG CAPS Take by mouth.  . sildenafil (REVATIO) 20 MG tablet Take 1 tablet by mouth as needed.  . vitamin B-12 (CYANOCOBALAMIN) 500 MCG tablet Take 500 mcg by mouth daily.   Marland Kitchen zolpidem (AMBIEN) 10 MG tablet Take 10 mg by mouth at bedtime as needed for sleep.      Allergies:   Shellfish allergy, Bactrim [sulfamethoxazole-trimethoprim], Iodine, and Other   Social History   Socioeconomic History  . Marital status: Married    Spouse name: Not on file  . Number of children: Not on file  . Years of education: Not on file  . Highest education level: Not on file  Occupational History  . Not on file  Tobacco Use  . Smoking status: Never Smoker  . Smokeless tobacco: Never Used  Vaping Use  . Vaping Use: Never used  Substance and Sexual Activity  . Alcohol use: Never  . Drug use: Never  . Sexual activity: Yes  Other Topics Concern  . Not on file  Social History Narrative  . Not on file   Social Determinants of Health   Financial Resource Strain: Not on file  Food Insecurity: Not on file  Transportation Needs: Not on file  Physical Activity: Not on file  Stress: Not on file  Social Connections: Not on file     Family History: The patient's family history includes Breast cancer in his mother; Cancer in his father and mother; Heart attack in his maternal grandfather and paternal grandmother; Heart disease in his paternal grandfather and paternal grandmother. ROS:   Please see the history of present illness.    All other systems reviewed and are negative.  EKGs/Labs/Other Studies Reviewed:    The following studies were reviewed today:  EKG: I personally reviewed  the downloads from his implanted loop recorder  Recent Labs: Cholesterol 161 HDL 46 triglycerides 230 LDL 77 glucose 125 creatinine 1.02 potassium 4.1 A1c 6.3% GFR 70 cc 01/14/2020: ALT 19; BUN 17; Creatinine, Ser 1.03; Potassium 4.3; Sodium 144  Recent Lipid Panel    Component Value Date/Time   CHOL 163 01/14/2020 0920   TRIG 322 (H) 01/14/2020 0920   HDL 37 (L) 01/14/2020 0920   CHOLHDL 4.4 01/14/2020 0920   LDLCALC 74 01/14/2020 0920    Physical Exam:    VS:  BP (!) 150/72   Pulse 64   Ht 5\' 11"  (1.803 m)   Wt 256 lb 12.8 oz (116.5 kg)   SpO2 95%   BMI 35.82 kg/m     Wt Readings from Last 3 Encounters:  06/15/20 256 lb 12.8 oz (116.5 kg)  04/05/20 250 lb (113.4 kg)  01/14/20 258 lb 1.9 oz (117.1 kg)  Standing blood pressure 140/60 GEN:  Well nourished, well developed in no acute distress HEENT: Normal NECK: No JVD; No carotid bruits LYMPHATICS: No lymphadenopathy CARDIAC: RRR, no murmurs, rubs, gallops RESPIRATORY:  Clear to auscultation without rales, wheezing or rhonchi  ABDOMEN: Soft, non-tender, non-distended MUSCULOSKELETAL:  No edema; No deformity  SKIN: Warm and dry NEUROLOGIC:  Alert and oriented x 3 PSYCHIATRIC:  Normal affect    Signed, Shirlee More, MD  06/15/2020 8:21 AM    [facility]

## 2020-06-15 ENCOUNTER — Encounter: Payer: Self-pay | Admitting: Cardiology

## 2020-06-15 ENCOUNTER — Ambulatory Visit (INDEPENDENT_AMBULATORY_CARE_PROVIDER_SITE_OTHER): Payer: Medicare PPO | Admitting: Cardiology

## 2020-06-15 ENCOUNTER — Other Ambulatory Visit: Payer: Self-pay

## 2020-06-15 VITALS — BP 150/72 | HR 64 | Ht 71.0 in | Wt 256.8 lb

## 2020-06-15 DIAGNOSIS — R55 Syncope and collapse: Secondary | ICD-10-CM | POA: Diagnosis not present

## 2020-06-15 DIAGNOSIS — I10 Essential (primary) hypertension: Secondary | ICD-10-CM

## 2020-06-15 DIAGNOSIS — E782 Mixed hyperlipidemia: Secondary | ICD-10-CM

## 2020-06-15 DIAGNOSIS — I447 Left bundle-branch block, unspecified: Secondary | ICD-10-CM

## 2020-06-15 NOTE — Patient Instructions (Signed)

## 2020-06-22 NOTE — Progress Notes (Signed)
Carelink Summary Report / Loop Recorder 

## 2020-07-12 ENCOUNTER — Other Ambulatory Visit: Payer: Self-pay | Admitting: Cardiology

## 2020-07-12 NOTE — Telephone Encounter (Signed)
Hydrochlorothiazide 25 mg # 90 x 3 refills to pharmacy

## 2020-07-19 ENCOUNTER — Ambulatory Visit (INDEPENDENT_AMBULATORY_CARE_PROVIDER_SITE_OTHER): Payer: Medicare PPO

## 2020-07-19 DIAGNOSIS — R55 Syncope and collapse: Secondary | ICD-10-CM | POA: Diagnosis not present

## 2020-07-20 DIAGNOSIS — Z85828 Personal history of other malignant neoplasm of skin: Secondary | ICD-10-CM | POA: Diagnosis not present

## 2020-07-20 DIAGNOSIS — L821 Other seborrheic keratosis: Secondary | ICD-10-CM | POA: Diagnosis not present

## 2020-07-20 DIAGNOSIS — D1801 Hemangioma of skin and subcutaneous tissue: Secondary | ICD-10-CM | POA: Diagnosis not present

## 2020-07-20 DIAGNOSIS — D0439 Carcinoma in situ of skin of other parts of face: Secondary | ICD-10-CM | POA: Diagnosis not present

## 2020-07-20 DIAGNOSIS — D225 Melanocytic nevi of trunk: Secondary | ICD-10-CM | POA: Diagnosis not present

## 2020-07-20 DIAGNOSIS — L570 Actinic keratosis: Secondary | ICD-10-CM | POA: Diagnosis not present

## 2020-07-20 DIAGNOSIS — L814 Other melanin hyperpigmentation: Secondary | ICD-10-CM | POA: Diagnosis not present

## 2020-07-21 LAB — CUP PACEART REMOTE DEVICE CHECK
Date Time Interrogation Session: 20220425020300
Implantable Pulse Generator Implant Date: 20200710
Pulse Gen Serial Number: 9515998

## 2020-07-22 DIAGNOSIS — Z9889 Other specified postprocedural states: Secondary | ICD-10-CM | POA: Diagnosis not present

## 2020-07-22 DIAGNOSIS — H40053 Ocular hypertension, bilateral: Secondary | ICD-10-CM | POA: Diagnosis not present

## 2020-07-22 DIAGNOSIS — H53021 Refractive amblyopia, right eye: Secondary | ICD-10-CM | POA: Diagnosis not present

## 2020-07-22 DIAGNOSIS — H35372 Puckering of macula, left eye: Secondary | ICD-10-CM | POA: Diagnosis not present

## 2020-08-05 DIAGNOSIS — H40053 Ocular hypertension, bilateral: Secondary | ICD-10-CM | POA: Diagnosis not present

## 2020-08-06 NOTE — Progress Notes (Signed)
Carelink Summary Report / Loop Recorder 

## 2020-08-24 ENCOUNTER — Ambulatory Visit (INDEPENDENT_AMBULATORY_CARE_PROVIDER_SITE_OTHER): Payer: Medicare PPO

## 2020-08-24 DIAGNOSIS — R55 Syncope and collapse: Secondary | ICD-10-CM | POA: Diagnosis not present

## 2020-08-25 LAB — CUP PACEART REMOTE DEVICE CHECK
Date Time Interrogation Session: 20220601141400
Implantable Pulse Generator Implant Date: 20200710
Pulse Gen Serial Number: 9515998

## 2020-08-31 ENCOUNTER — Encounter (INDEPENDENT_AMBULATORY_CARE_PROVIDER_SITE_OTHER): Payer: Medicare PPO | Admitting: Ophthalmology

## 2020-09-04 IMAGING — CR THORACIC SPINE - 3 VIEWS
3 series · 3 of 3 positions shown · non-contrast
Comparison: None.

CLINICAL DATA: Bilateral acute back pain.  Fall 3 days ago.

EXAM:
THORACIC SPINE - 3 VIEWS

[t t-spine a.p.]
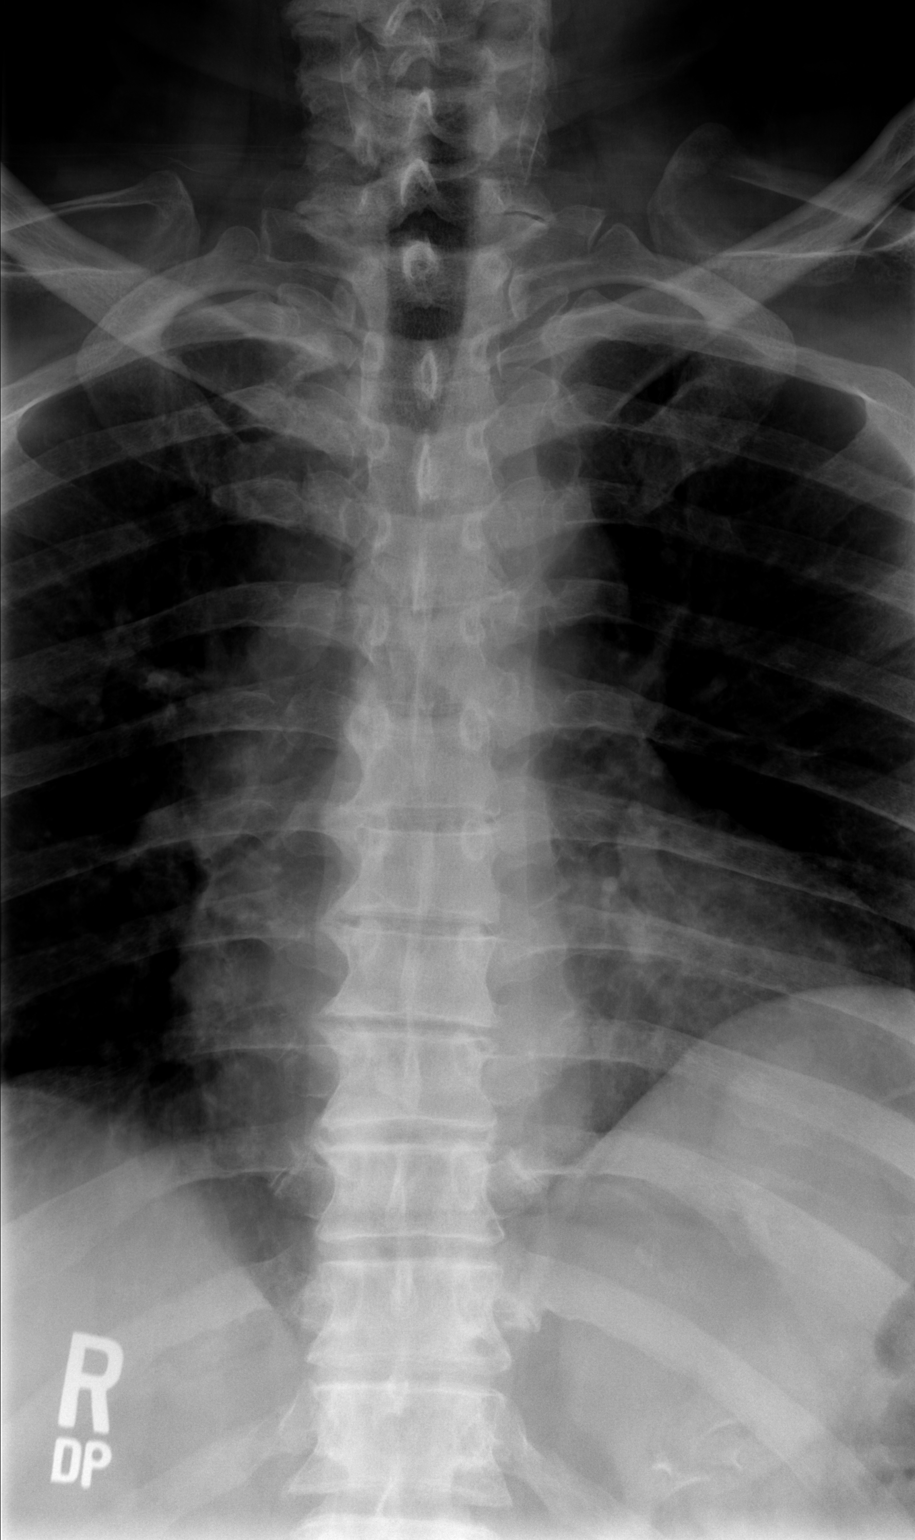

[t t-spine lat]
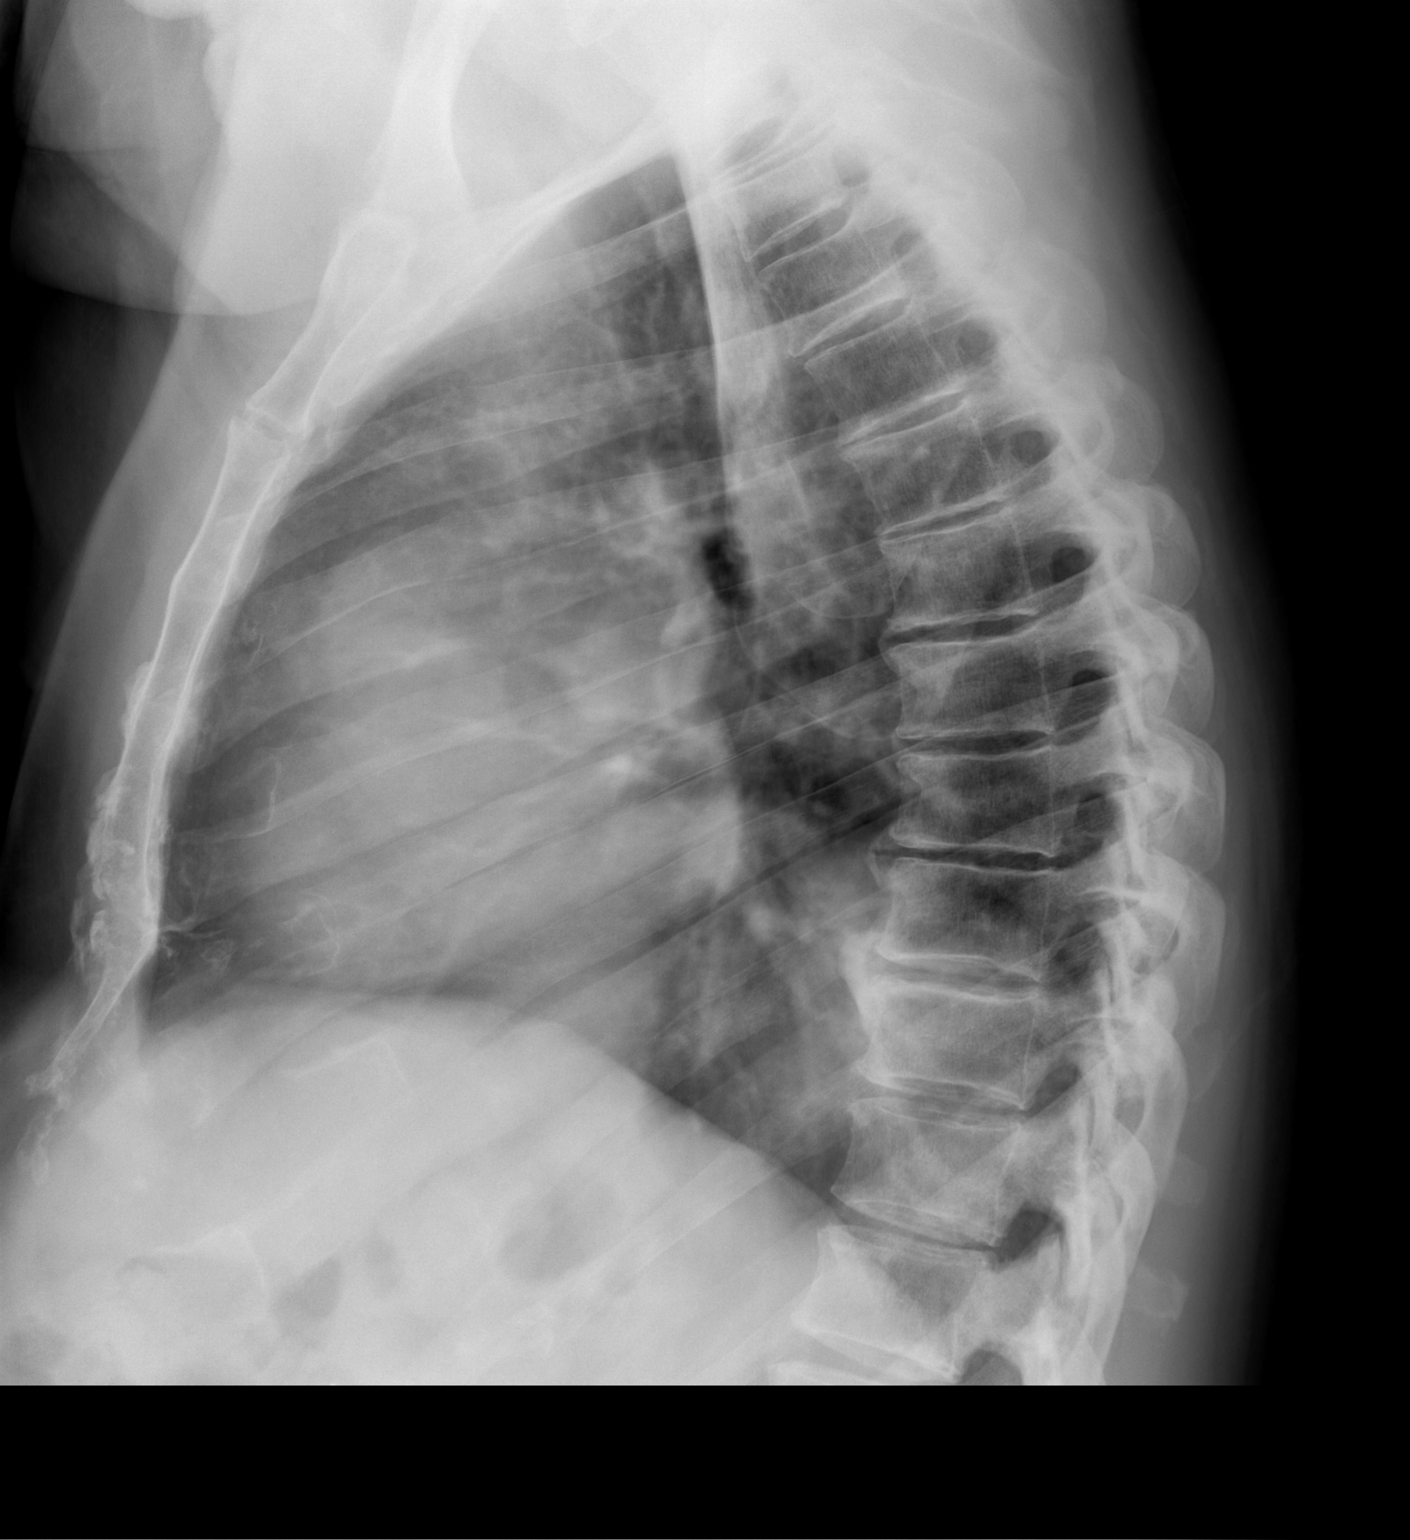

[t swimmers]
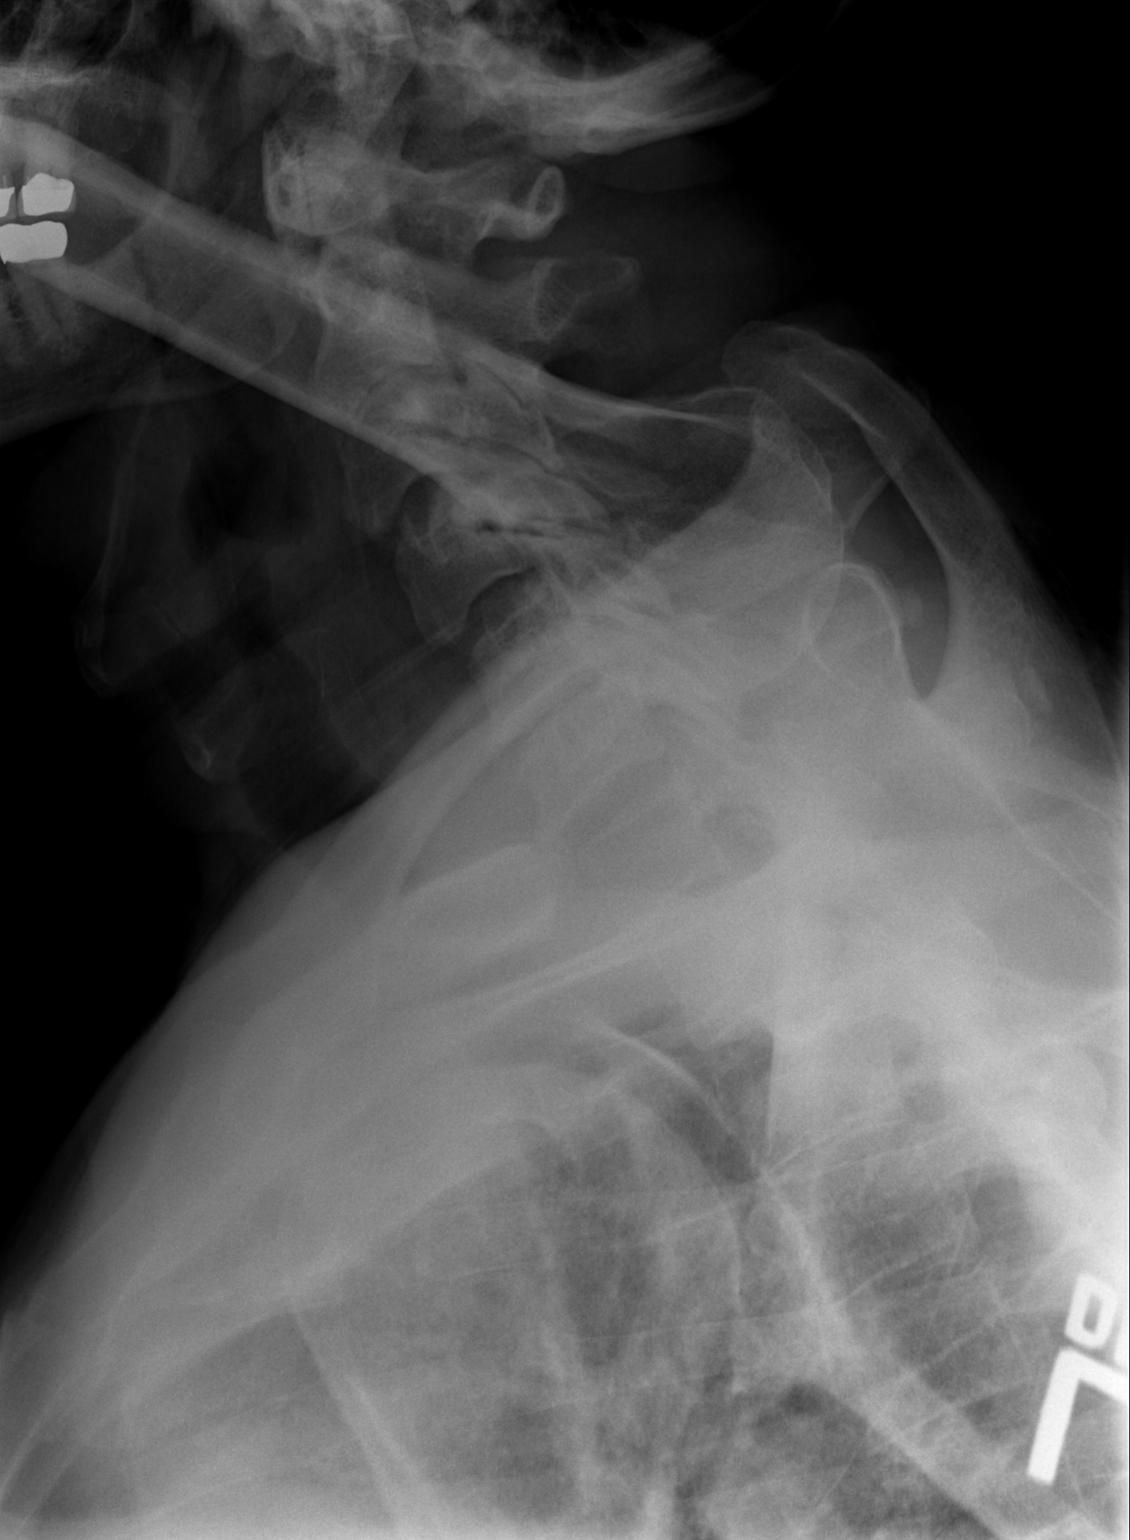

[3 of 3 positions shown; findings below may reference images not displayed]

FINDINGS: There is no evidence of thoracic spine fracture. Alignment is
normal. Mild degenerative disc disease noted throughout the thoracic
spine.
IMPRESSION: Negative.

## 2020-09-06 ENCOUNTER — Telehealth: Payer: Self-pay | Admitting: Emergency Medicine

## 2020-09-06 NOTE — Telephone Encounter (Signed)
Patient notified loop recorder at [facility]. Patient has [employer] Jude loop recorder and Dr Curt Bears will remove the device in the office. Will expect call from scheduler to make appointment for extraction.

## 2020-09-07 DIAGNOSIS — L237 Allergic contact dermatitis due to plants, except food: Secondary | ICD-10-CM | POA: Diagnosis not present

## 2020-09-07 DIAGNOSIS — D0439 Carcinoma in situ of skin of other parts of face: Secondary | ICD-10-CM | POA: Diagnosis not present

## 2020-09-07 DIAGNOSIS — Z85828 Personal history of other malignant neoplasm of skin: Secondary | ICD-10-CM | POA: Diagnosis not present

## 2020-09-16 NOTE — Progress Notes (Signed)
ILR

## 2020-10-25 ENCOUNTER — Ambulatory Visit (INDEPENDENT_AMBULATORY_CARE_PROVIDER_SITE_OTHER): Payer: Medicare PPO | Admitting: Cardiology

## 2020-10-25 ENCOUNTER — Encounter: Payer: Self-pay | Admitting: Cardiology

## 2020-10-25 ENCOUNTER — Other Ambulatory Visit: Payer: Self-pay

## 2020-10-25 VITALS — BP 155/74 | HR 57 | Ht 71.0 in | Wt 258.0 lb

## 2020-10-25 DIAGNOSIS — R55 Syncope and collapse: Secondary | ICD-10-CM

## 2020-10-25 NOTE — Progress Notes (Signed)
Electrophysiology Office Note   Date:  10/25/2020   ID:  [name], DOB 1941-01-31, MRN [id number]  PCP:  Lujean Amel, MD  Cardiologist:  Bettina Gavia Primary Electrophysiologist:  Barett Whidbee Meredith Leeds, MD    Chief Complaint: Pacemaker   History of Present Illness: Dylan Zimmerman is a 80 y.o. male who is being seen today for the evaluation of pacemaker at the request of Koirala, Dibas, MD. Presenting today for electrophysiology evaluation.  He has a history significant for hypertension, hyperlipidemia, obesity, PVCs, and syncope.  He is working outside and [employer] and felt that he would faint.  He is now status post [place] confirm implanted 10/04/2018.  Today, denies symptoms of palpitations, chest pain, shortness of breath, orthopnea, PND, lower extremity edema, claudication, dizziness, presyncope, syncope, bleeding, or neurologic sequela. The patient is tolerating medications without difficulties.    Past Medical History:  Diagnosis Date   Back pain at L4-L5 level 12/07/2018   Cholelithiasis with chronic cholecystitis 04/20/2019   Chronic cholecystitis    Colorectal polyp detected on colonoscopy 01/01/2020   Elevated liver enzymes 04/20/2019   Essential hypertension 06/18/2018   Hyperlipidemia    Hyperlipidemia    Hypertension    Obesity 06/18/2018   Palpitations 06/18/2018   PVC's (premature ventricular contractions) 08/21/2018   Syncope and collapse    Past Surgical History:  Procedure Laterality Date   CATARACT EXTRACTION Bilateral    CHOLECYSTECTOMY N/A 04/21/2019   Procedure: LAPAROSCOPIC CHOLECYSTECTOMY WITH INTRAOPERATIVE CHOLANGIOGRAM;  Surgeon: Armandina Gemma, MD;  Location: [place];  Service: General;  Laterality: N/A;   LITHOTRIPSY     LOOP RECORDER INSERTION N/A 10/04/2018   Procedure: LOOP RECORDER INSERTION;  Surgeon: Constance Haw, MD;  Location: [place] [facility];  Service: Cardiovascular;  Laterality: N/A;   TONSILLECTOMY        Current Outpatient Medications  Medication Sig Dispense Refill   amLODipine (NORVASC) 2.5 MG tablet Take 5 mg by mouth daily.     Ascorbic Acid (VITAMIN C) 500 MG CAPS Take by mouth.     aspirin EC 81 MG tablet Take 81 mg by mouth daily.     atorvastatin (LIPITOR) 10 MG tablet Take 10 mg by mouth daily.     cholecalciferol (VITAMIN D3) 25 MCG (1000 UNIT) tablet Take 1,000 Units by mouth daily.     fluticasone (FLONASE) 50 MCG/ACT nasal spray Place into both nostrils.     Glucosamine-Chondroit-Vit C-Mn (GLUCOSAMINE 1500 COMPLEX PO) Take 1 tablet by mouth daily.     hydrochlorothiazide (HYDRODIURIL) 25 MG tablet TAKE 1 TABLET BY MOUTH EVERY DAY 90 tablet 3   Multiple Vitamin (MULTIVITAMIN) tablet Take 1 tablet by mouth daily.     Omega-3 Fatty Acids (FISH OIL) 1000 MG CAPS Take by mouth.     sildenafil (REVATIO) 20 MG tablet Take 1 tablet by mouth as needed.     vitamin B-12 (CYANOCOBALAMIN) 500 MCG tablet Take 500 mcg by mouth daily.      zolpidem (AMBIEN) 10 MG tablet Take 10 mg by mouth at bedtime as needed for sleep.      No current facility-administered medications for this visit.    Allergies:   Shellfish allergy, Bactrim [sulfamethoxazole-trimethoprim], Iodine, and Other   Social History:  The patient  reports that he has never smoked. He has never used smokeless tobacco. He reports that he does not drink alcohol and does not use drugs.   Family History:  The patient's family history includes Breast cancer  in his mother; Cancer in his father and mother; Heart attack in his maternal grandfather and paternal grandmother; Heart disease in his paternal grandfather and paternal grandmother.   ROS:  Please see the history of present illness.   Otherwise, review of systems is positive for none.   All other systems are reviewed and negative.   PHYSICAL EXAM: VS:  BP (!) 155/74   Pulse (!) 57   Ht '5\' 11"'$  (1.803 m)   Wt 258 lb (117 kg)   BMI 35.98 kg/m  , BMI Body mass index is  35.98 kg/m. GEN: Well nourished, well developed, in no acute distress  HEENT: normal  Neck: no JVD, carotid bruits, or masses Cardiac: RRR; no murmurs, rubs, or gallops,no edema  Respiratory:  clear to auscultation bilaterally, normal work of breathing GI: soft, nontender, nondistended, + BS MS: no deformity or atrophy  Skin: warm and dry, device site well healed Neuro:  Strength and sensation are intact Psych: euthymic mood, full affect  EKG:  EKG is not ordered today. Personal review of the ekg ordered 04/05/20 shows sinus rhythm, left bundle branch block  Personal review of the device interrogation today. Results in [facility] g    Recent Labs: 01/14/2020: ALT 19; BUN 17; Creatinine, Ser 1.03; Potassium 4.3; Sodium 144    Lipid Panel     Component Value Date/Time   CHOL 163 01/14/2020 0920   TRIG 322 (H) 01/14/2020 0920   HDL 37 (L) 01/14/2020 0920   CHOLHDL 4.4 01/14/2020 0920   LDLCALC 74 01/14/2020 0920     Wt Readings from Last 3 Encounters:  10/25/20 258 lb (117 kg)  06/15/20 256 lb 12.8 oz (116.5 kg)  04/05/20 250 lb (113.4 kg)      Other studies Reviewed: Additional studies/ records that were reviewed today include: TTE 09/04/18  Review of the above records today demonstrates:   1. The left ventricle has normal systolic function with an ejection  fraction of 60-65%. The cavity size was normal. There is mild concentric  left ventricular hypertrophy. Left ventricular diastolic Doppler  parameters are consistent with impaired  relaxation. There is mildly abnormal septal motion consistent with left  bundle branch block.   2. The right ventricle has normal systolic function. The cavity was  mildly enlarged. There is no increase in right ventricular wall thickness.   3. No evidence of mitral valve stenosis.   4. No stenosis of the aortic valve.   5. 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    ASSESSMENT AND PLAN:  1.  Recurrent  syncope: Has had a normal Myoview.  Has a left bundle branch block.  Is status post [facility] monitor.  He has had no arrhythmias to cause of syncope.  Due to that, he would like to have his monitor explanted.  Procedure was discussed with the patient.  Risks and benefits including bleeding and infection.  He understands his risks and is agreed to the procedure.   Current medicines are reviewed at length with the patient today.   The patient does not have concerns regarding his medicines.  The following changes were made today:  none  Labs/ tests ordered today include:  No orders of the defined types were placed in this encounter.    Disposition:   FU with Kaytelyn Glore PRN year  Signed, Chihiro Frey Meredith Leeds, MD  10/25/2020 2:40 PM     [facility] [address] [place] [address] (  [phone] (office) [phone] (fax)  SURGEON:  Allegra Lai, MD     PREPROCEDURE DIAGNOSIS:  syncope    POSTPROCEDURE DIAGNOSIS:  syncope     PROCEDURES:   1. Implantable loop recorder implantation    INTRODUCTION:  Dylan Zimmerman is a 80 y.o. male with a history of unexplained stroke who presents today for implantable loop explant.  he has had a CONFIRM monitor.  CONFIRM is now at [facility] and wishes the monitor explanted.     DESCRIPTION OF PROCEDURE:  Informed written consent was obtained, and the patient was brought to the electrophysiology lab in a fasting state.  The patient required no sedation for the procedure today.  The patients left chest was therefore prepped and draped in the usual sterile fashion by the EP lab staff. The skin overlying the left parasternal region was infiltrated with lidocaine for local analgesia.  A 0.5-cm incision was made over the left parasternal region over the existing CONFIRM monitor.  Using a combination of sharp and blount dissection, the CONFIRM monitor was removed from the pocket.  Steri- Strips and a sterile dressing were then applied.   There were no early apparent complications.     CONCLUSIONS:   1. Successful explant of a [employer] Jude Confirm implantable loop recorder for syncope  2. No early apparent complications.

## 2020-10-25 NOTE — Patient Instructions (Signed)
Medication Instructions:  Your physician recommends that you continue on your current medications as directed. Please refer to the Current Medication list given to you today.  *If you need a refill on your cardiac medications before your next appointment, please call your pharmacy*   Lab Work: None ordered   Testing/Procedures: None ordered   Follow-Up: At [facility], you and your health needs are our priority.  As part of our continuing mission to provide you with exceptional heart care, we have created designated Provider Care Teams.  These Care Teams include your primary Cardiologist (physician) and Advanced Practice Providers (APPs -  Physician Assistants and Nurse Practitioners) who all work together to provide you with the care you need, when you need it.  Your next appointment:   as  needed  The format for your next appointment:   In Person  Provider:   Will Camnitz, MD    Thank you for choosing [facility]!!   Tionne Dayhoff, RN [phone]        

## 2020-10-29 NOTE — Addendum Note (Signed)
Addended by: Claude Manges on: 10/29/2020 12:51 PM   Modules accepted: Orders

## 2020-11-15 DIAGNOSIS — Z79899 Other long term (current) drug therapy: Secondary | ICD-10-CM | POA: Diagnosis not present

## 2020-11-15 DIAGNOSIS — R7309 Other abnormal glucose: Secondary | ICD-10-CM | POA: Diagnosis not present

## 2020-11-15 DIAGNOSIS — M5440 Lumbago with sciatica, unspecified side: Secondary | ICD-10-CM | POA: Diagnosis not present

## 2020-11-15 DIAGNOSIS — E7800 Pure hypercholesterolemia, unspecified: Secondary | ICD-10-CM | POA: Diagnosis not present

## 2020-11-15 DIAGNOSIS — R7303 Prediabetes: Secondary | ICD-10-CM | POA: Diagnosis not present

## 2020-11-15 DIAGNOSIS — M48061 Spinal stenosis, lumbar region without neurogenic claudication: Secondary | ICD-10-CM | POA: Diagnosis not present

## 2020-11-15 DIAGNOSIS — I10 Essential (primary) hypertension: Secondary | ICD-10-CM | POA: Diagnosis not present

## 2020-11-15 DIAGNOSIS — G4700 Insomnia, unspecified: Secondary | ICD-10-CM | POA: Diagnosis not present

## 2021-01-11 DIAGNOSIS — H40053 Ocular hypertension, bilateral: Secondary | ICD-10-CM | POA: Diagnosis not present

## 2021-01-19 DIAGNOSIS — L821 Other seborrheic keratosis: Secondary | ICD-10-CM | POA: Diagnosis not present

## 2021-01-19 DIAGNOSIS — D0439 Carcinoma in situ of skin of other parts of face: Secondary | ICD-10-CM | POA: Diagnosis not present

## 2021-01-19 DIAGNOSIS — Z85828 Personal history of other malignant neoplasm of skin: Secondary | ICD-10-CM | POA: Diagnosis not present

## 2021-01-19 DIAGNOSIS — D1801 Hemangioma of skin and subcutaneous tissue: Secondary | ICD-10-CM | POA: Diagnosis not present

## 2021-01-19 DIAGNOSIS — L814 Other melanin hyperpigmentation: Secondary | ICD-10-CM | POA: Diagnosis not present

## 2021-01-19 DIAGNOSIS — D2372 Other benign neoplasm of skin of left lower limb, including hip: Secondary | ICD-10-CM | POA: Diagnosis not present

## 2021-02-01 DIAGNOSIS — H43812 Vitreous degeneration, left eye: Secondary | ICD-10-CM | POA: Diagnosis not present

## 2021-02-01 DIAGNOSIS — H35372 Puckering of macula, left eye: Secondary | ICD-10-CM | POA: Diagnosis not present

## 2021-02-01 DIAGNOSIS — H31091 Other chorioretinal scars, right eye: Secondary | ICD-10-CM | POA: Diagnosis not present

## 2021-02-01 DIAGNOSIS — H3581 Retinal edema: Secondary | ICD-10-CM | POA: Diagnosis not present

## 2021-02-21 DIAGNOSIS — H35372 Puckering of macula, left eye: Secondary | ICD-10-CM | POA: Diagnosis not present

## 2021-02-21 DIAGNOSIS — H4312 Vitreous hemorrhage, left eye: Secondary | ICD-10-CM | POA: Diagnosis not present

## 2021-02-21 DIAGNOSIS — H3581 Retinal edema: Secondary | ICD-10-CM | POA: Diagnosis not present

## 2021-02-21 DIAGNOSIS — H2512 Age-related nuclear cataract, left eye: Secondary | ICD-10-CM | POA: Diagnosis not present

## 2021-02-22 DIAGNOSIS — H35372 Puckering of macula, left eye: Secondary | ICD-10-CM | POA: Diagnosis not present

## 2021-03-01 DIAGNOSIS — H3581 Retinal edema: Secondary | ICD-10-CM | POA: Diagnosis not present

## 2021-03-06 IMAGING — CR DG HIP (WITH OR WITHOUT PELVIS) 2-3V*R*
3 series · 3 of 3 positions shown · non-contrast
Comparison: None.

CLINICAL DATA: Generalized hip pain.

EXAM:
DG HIP (WITH OR WITHOUT PELVIS) 2-3V RIGHT

[t hip ap right *]
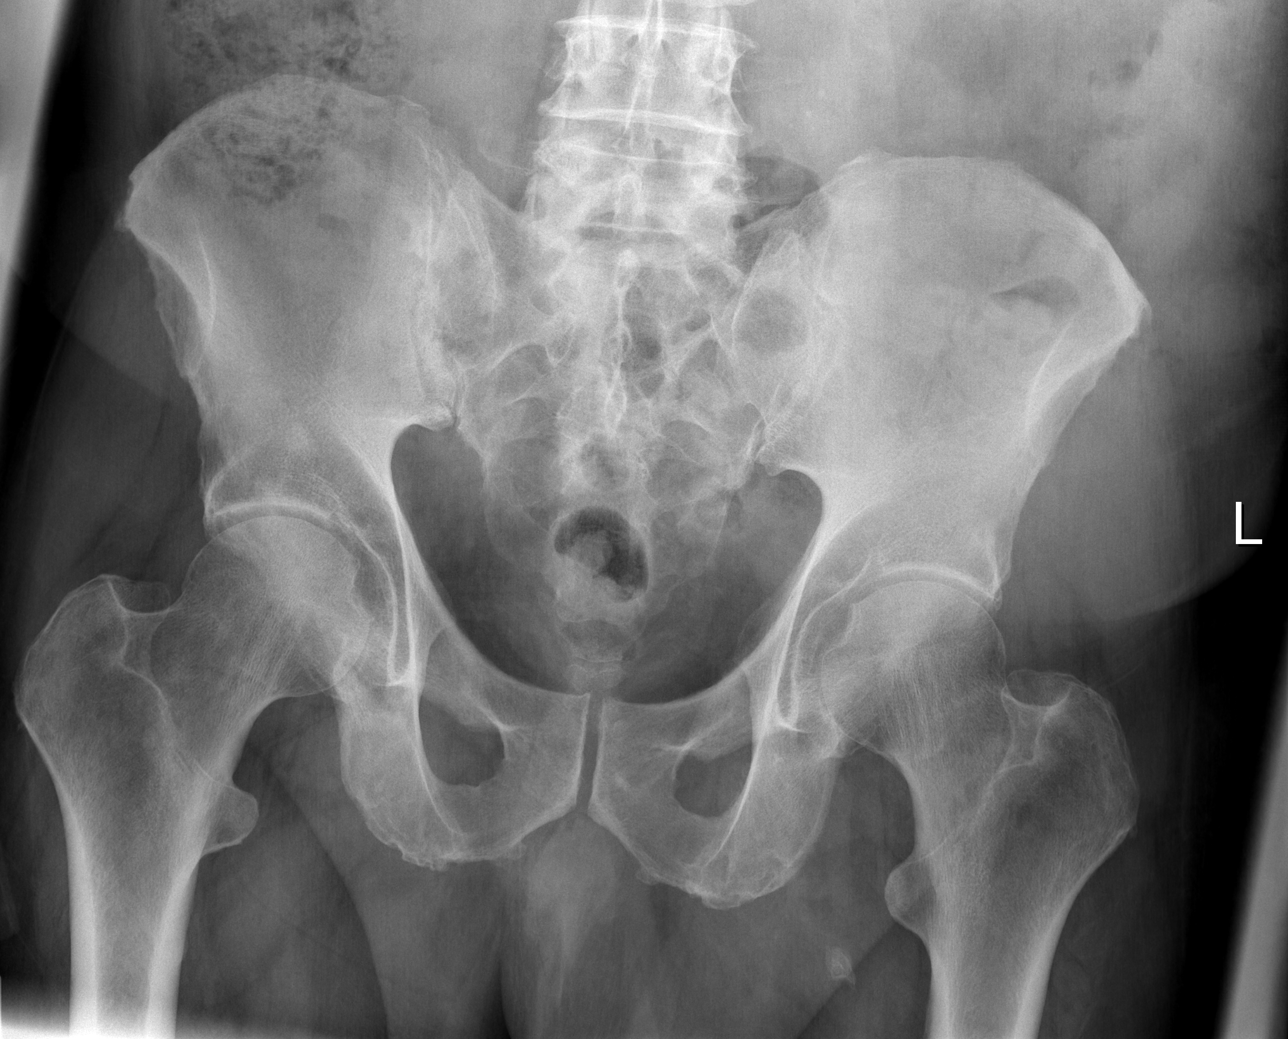

[t hip ap right]
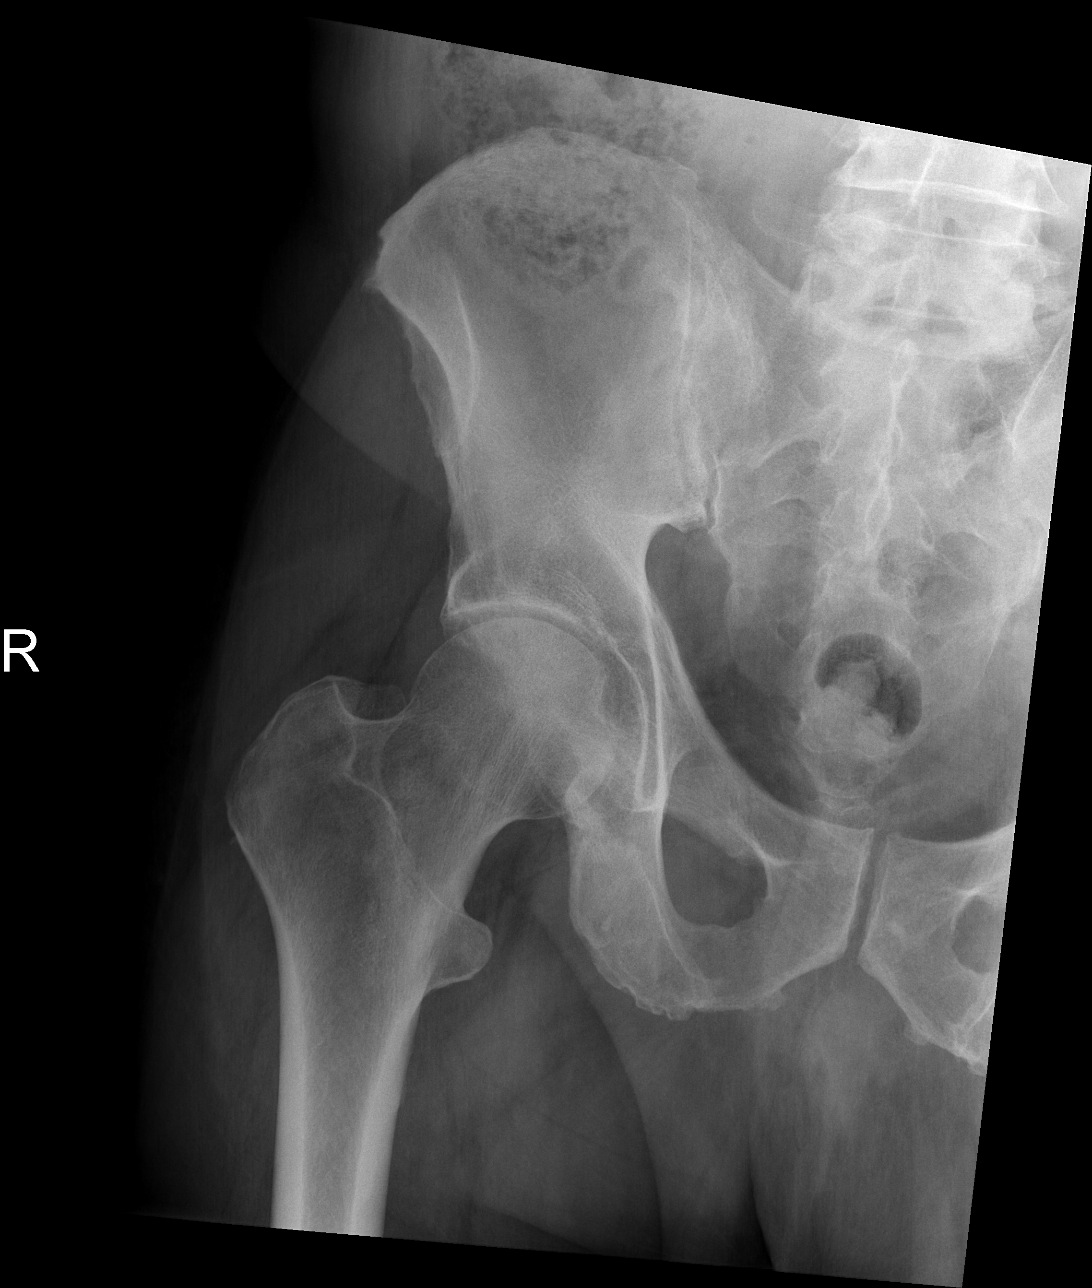

[t hip frog leg right]
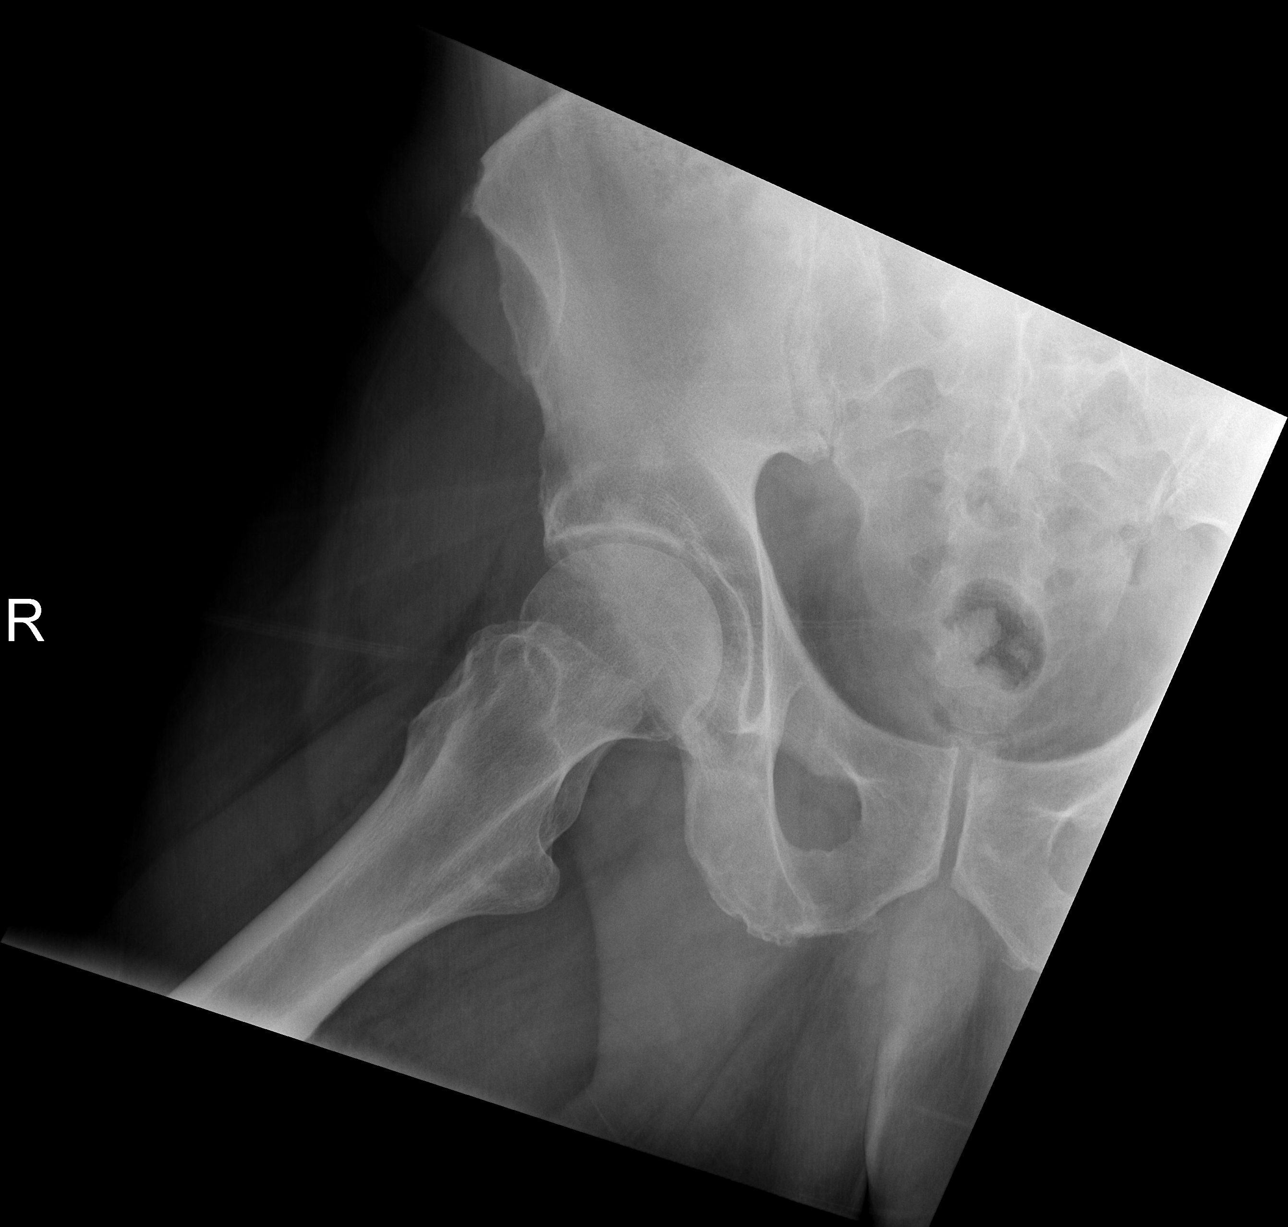

[3 of 3 positions shown; findings below may reference images not displayed]

FINDINGS: There is no evidence of hip fracture or dislocation. There is no
evidence of arthropathy or other focal bone abnormality.
IMPRESSION: Negative.

## 2021-03-29 DIAGNOSIS — H3581 Retinal edema: Secondary | ICD-10-CM | POA: Diagnosis not present

## 2021-04-26 IMAGING — US US ABDOMEN COMPLETE
1 series · 14 of 25 positions shown · non-contrast
Comparison: None.

CLINICAL DATA: Right upper quadrant pain

EXAM:
ABDOMEN ULTRASOUND COMPLETE

[Series 1: us abdomen complete · 0.17mm/px · 14 of 99 slices shown]
[im 1/99]
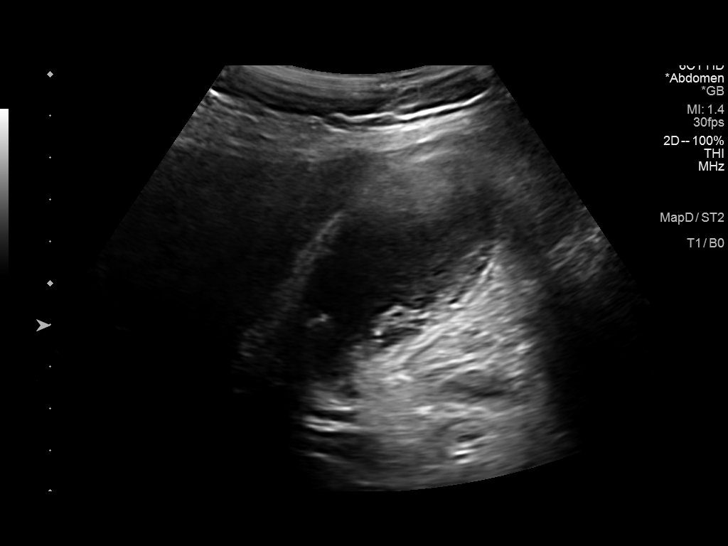
[im 9/99]
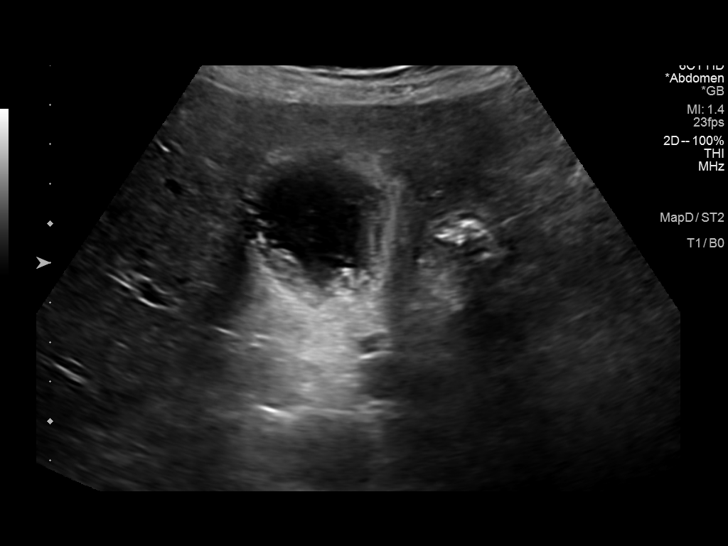
[im 17/99]
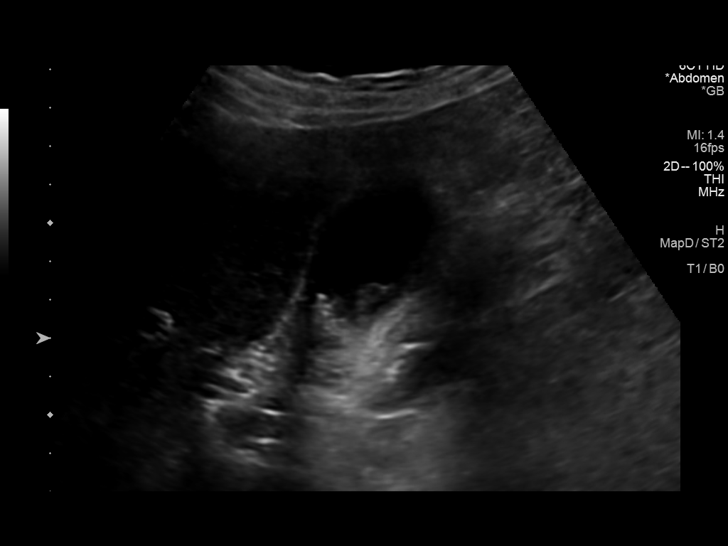
[im 25/99]
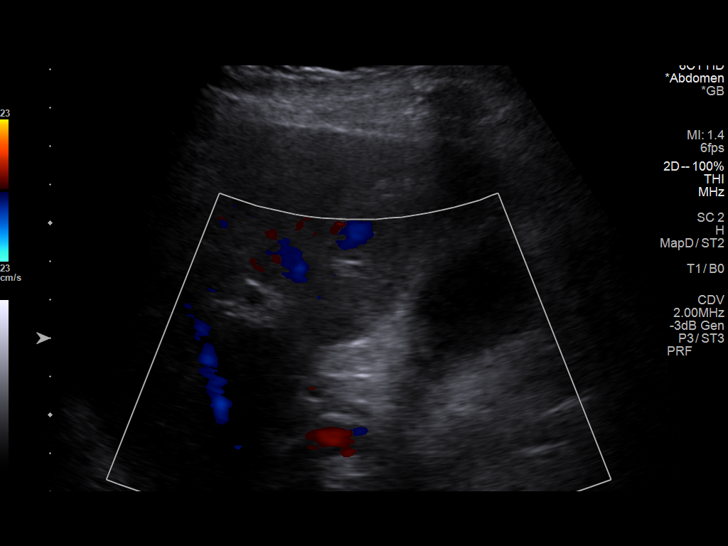
[im 33/99]
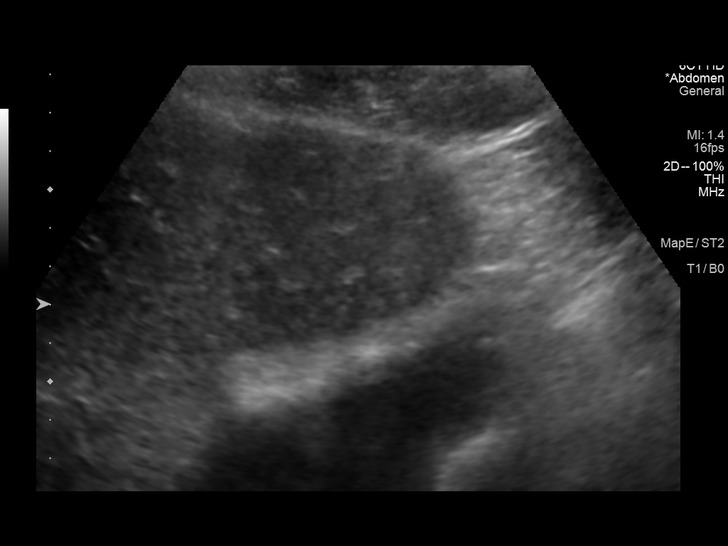
[im 37/99]
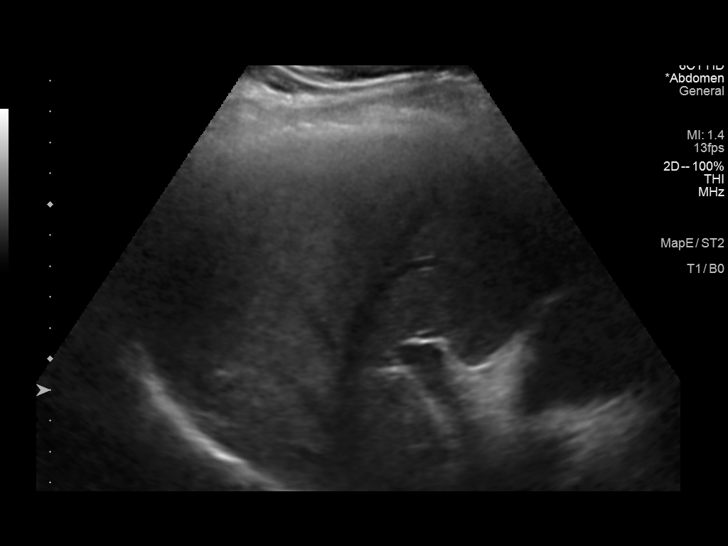
[im 45/99]
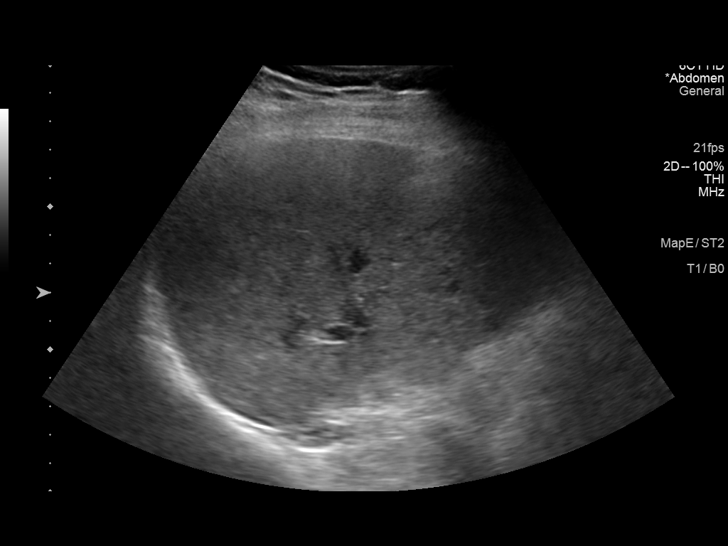
[im 54/99]
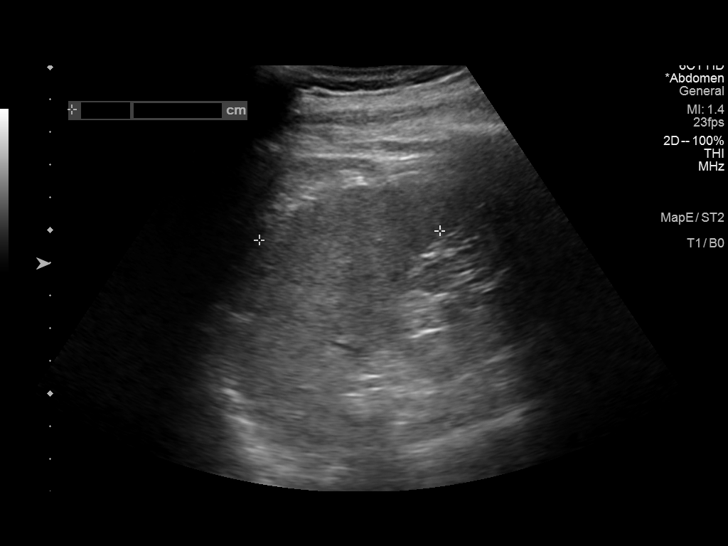
[im 62/99]
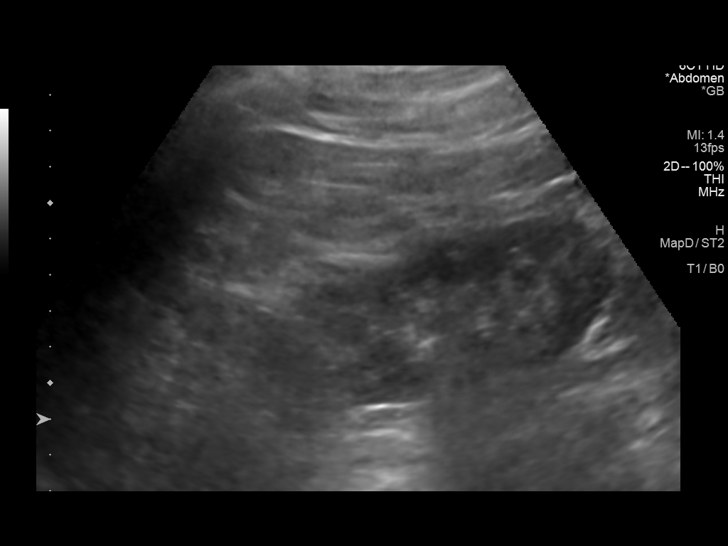
[im 66/99]
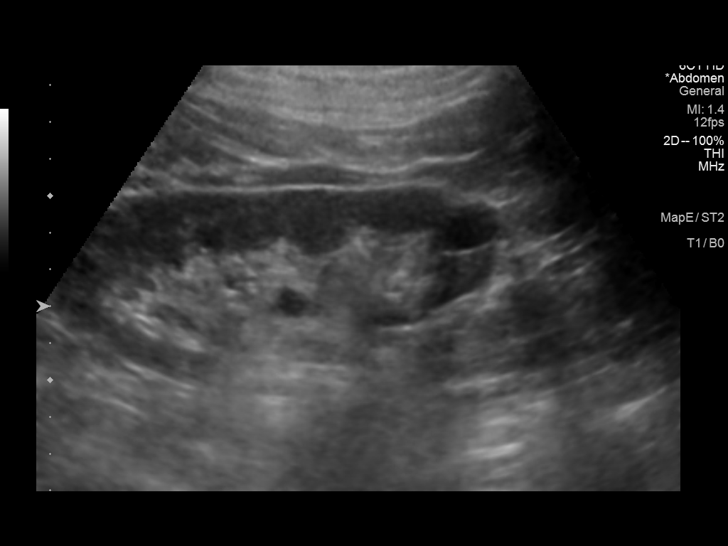
[im 74/99]
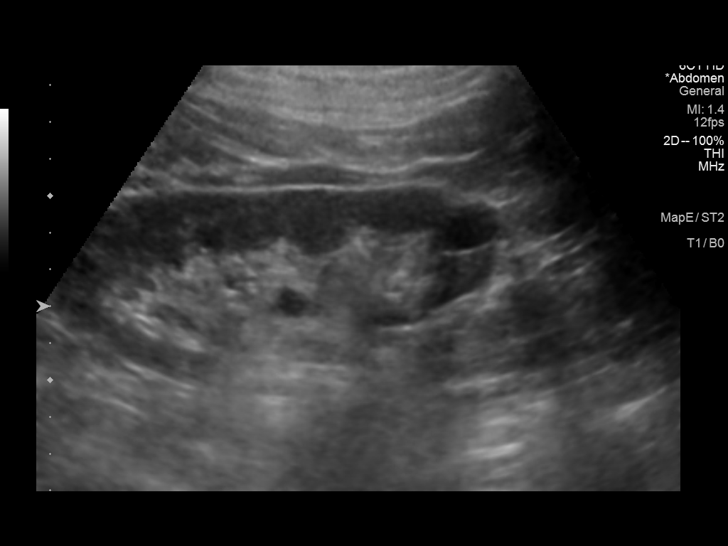
[im 82/99]
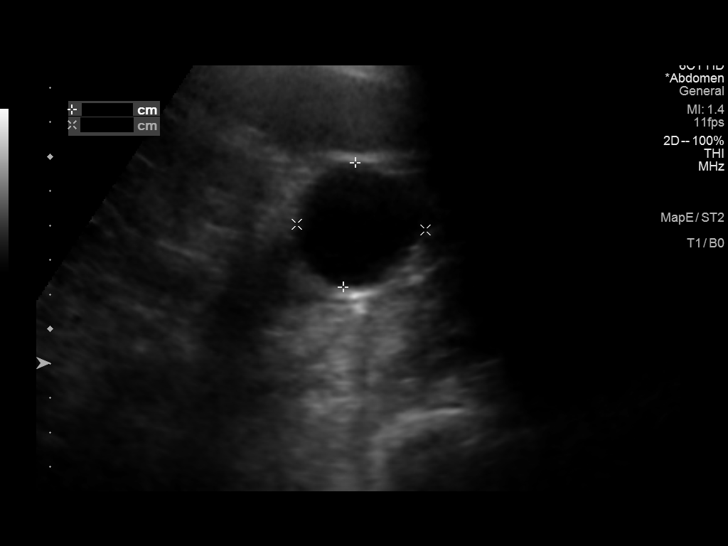
[im 90/99]
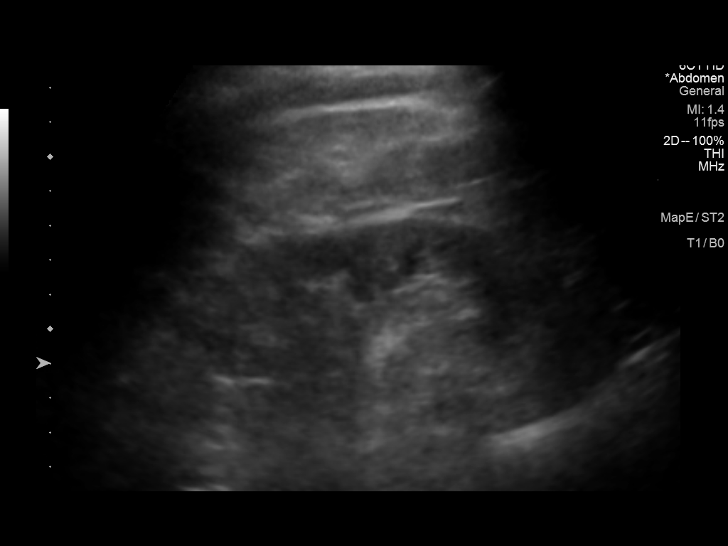
[im 99/99]
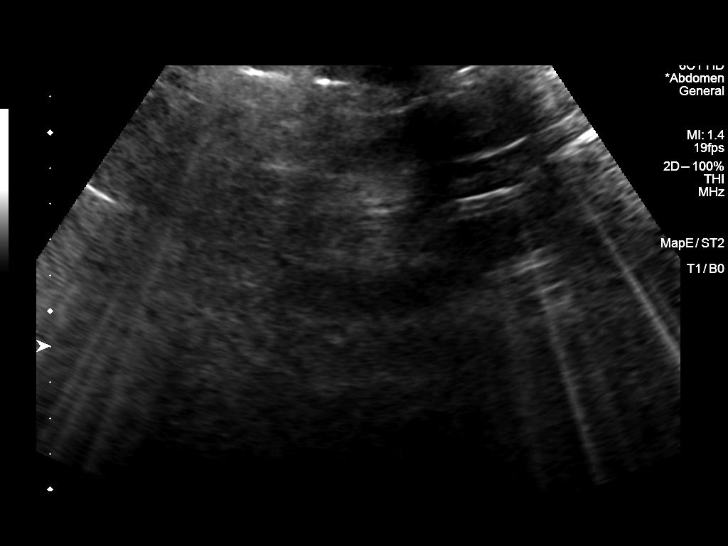

[14 of 25 positions shown; findings below may reference images not displayed]

FINDINGS: Gallbladder: Mobile gallstones and sludge in the gallbladder. No
gallbladder wall thickening. No sonographic Murphy sign noted by
sonographer.

Common bile duct: Diameter: 4 mm

Liver: No focal lesion identified. Increased parenchymal
echogenicity. Portal vein is patent on color Doppler imaging with
normal direction of blood flow towards the liver.

IVC: No abnormality visualized.

Pancreas: Visualized portion unremarkable.

Spleen: Size and appearance within normal limits.

Right Kidney: Length: 12.5 cm. Echogenicity within normal limits. No
mass or hydronephrosis visualized. Exophytic cyst measuring 1.8 cm.

Left Kidney: Length: 13.7 cm. Echogenicity within normal limits. No
mass or hydronephrosis visualized. Exophytic cyst measuring 3.7 cm.

Abdominal aorta: No aneurysm visualized.

Other findings: None.
IMPRESSION: 1. Gallstones and sludge in the gallbladder. No gallbladder wall
thickening. No pericholecystic fluid. No biliary ductal dilatation.
Negative sonographic Murphy sign.

2.  Hepatic steatosis.

## 2021-05-16 DIAGNOSIS — Z0001 Encounter for general adult medical examination with abnormal findings: Secondary | ICD-10-CM | POA: Diagnosis not present

## 2021-05-16 DIAGNOSIS — Z79899 Other long term (current) drug therapy: Secondary | ICD-10-CM | POA: Diagnosis not present

## 2021-05-16 DIAGNOSIS — E7800 Pure hypercholesterolemia, unspecified: Secondary | ICD-10-CM | POA: Diagnosis not present

## 2021-05-16 DIAGNOSIS — R7303 Prediabetes: Secondary | ICD-10-CM | POA: Diagnosis not present

## 2021-05-16 DIAGNOSIS — Z125 Encounter for screening for malignant neoplasm of prostate: Secondary | ICD-10-CM | POA: Diagnosis not present

## 2021-05-23 DIAGNOSIS — R7303 Prediabetes: Secondary | ICD-10-CM | POA: Diagnosis not present

## 2021-05-23 DIAGNOSIS — Z0001 Encounter for general adult medical examination with abnormal findings: Secondary | ICD-10-CM | POA: Diagnosis not present

## 2021-05-23 DIAGNOSIS — E7800 Pure hypercholesterolemia, unspecified: Secondary | ICD-10-CM | POA: Diagnosis not present

## 2021-05-23 DIAGNOSIS — I10 Essential (primary) hypertension: Secondary | ICD-10-CM | POA: Diagnosis not present

## 2021-05-23 DIAGNOSIS — G4700 Insomnia, unspecified: Secondary | ICD-10-CM | POA: Diagnosis not present

## 2021-06-26 ENCOUNTER — Other Ambulatory Visit: Payer: Self-pay | Admitting: Cardiology

## 2021-07-05 DIAGNOSIS — Z9889 Other specified postprocedural states: Secondary | ICD-10-CM | POA: Diagnosis not present

## 2021-07-05 DIAGNOSIS — Z961 Presence of intraocular lens: Secondary | ICD-10-CM | POA: Diagnosis not present

## 2021-07-05 DIAGNOSIS — H53021 Refractive amblyopia, right eye: Secondary | ICD-10-CM | POA: Diagnosis not present

## 2021-07-05 DIAGNOSIS — H401132 Primary open-angle glaucoma, bilateral, moderate stage: Secondary | ICD-10-CM | POA: Diagnosis not present

## 2021-07-11 NOTE — Progress Notes (Signed)
?Cardiology Office Note:   ? ?Date:  07/12/2021  ? ?ID:  Dylan Zimmerman, DOB [birth year], MRN [id number] ? ?PCP:  Lujean Amel, MD  ?Cardiologist:  Shirlee More, MD   ? ?Referring MD: Lujean Amel, MD  ? ? ?ASSESSMENT:   ? ?1. Syncope and collapse   ?2. Essential hypertension   ?3. Left bundle branch block   ?4. Mixed hyperlipidemia   ?5. PVC's (premature ventricular contractions)   ? ?PLAN:   ? ?In order of problems listed above: ? ?Stable no recurrence likely due to orthostatic hypotension in the past systolic blood pressure in the range of 140 continue current treatment including diuretic dose calcium channel blocker ?He has mild exertional shortness of breath echocardiogram in 2020 showed no findings of heart failure and I do not think we need to repeat cardiac testing unless symptoms are significant progressive ?Continue statin his lipids are at target ?Stable asymptomatic ? ? ?Next appointment: 9 months ? ? ?Medication Adjustments/Labs and Tests Ordered: ?Current medicines are reviewed at length with the patient today.  Concerns regarding medicines are outlined above.  ?No orders of the defined types were placed in this encounter. ? ?No orders of the defined types were placed in this encounter. ? ? ?I am overdue for follow-up for hypertension ? ? ?History of Present Illness:   ? ?Dylan Zimmerman is a 81 y.o. male with a hx of an implanted loop recorder,hypertension syncope, PVCs orthostatic hypotension and left bundle branch block  last seen 06/15/2020 . The implantable loop recorder was explanted at his request. ?Recent labs 05/24/2021: ?CBC normal hemoglobin 14.2 platelets 172,000 lipid profile with a cholesterol 148 LDL 73 triglycerides 219 HDL 39 A1c 6.3% CMP is normal creatinine 0.96 GFR 79 cc potassium 4.1 sodium 143 ? ?Compliance with diet, lifestyle and medications: Yes ? ?He has struggled with low back pain radicular to both legs has been sedentary and finds himself very mildly short of breath at times  with exertion ?No edema orthopnea chest pain palpitation or syncope. ?He has purchased a wireless receiver and checks his rhythm at home I looked at 2 strips sinus rhythm no arrhythmia.  Recent labs reviewed ?Past Medical History:  ?Diagnosis Date  ? Back pain at L4-L5 level 12/07/2018  ? Cholelithiasis with chronic cholecystitis 04/20/2019  ? Chronic cholecystitis   ? Colorectal polyp detected on colonoscopy 01/01/2020  ? Elevated liver enzymes 04/20/2019  ? Essential hypertension 06/18/2018  ? Hyperlipidemia   ? Hyperlipidemia   ? Hypertension   ? Obesity 06/18/2018  ? Palpitations 06/18/2018  ? PVC's (premature ventricular contractions) 08/21/2018  ? Syncope and collapse   ? ? ?Past Surgical History:  ?Procedure Laterality Date  ? CATARACT EXTRACTION Bilateral   ? CHOLECYSTECTOMY N/A 04/21/2019  ? Procedure: LAPAROSCOPIC CHOLECYSTECTOMY WITH INTRAOPERATIVE CHOLANGIOGRAM;  Surgeon: Armandina Gemma, MD;  Location: [facility];  Service: General;  Laterality: N/A;  ? LITHOTRIPSY    ? LOOP RECORDER INSERTION N/A 10/04/2018  ? Procedure: LOOP RECORDER INSERTION;  Surgeon: Constance Haw, MD;  Location: [facility];  Service: Cardiovascular;  Laterality: N/A;  ? TONSILLECTOMY    ? ? ?Current Medications: ?Current Meds  ?Medication Sig  ? amLODipine (NORVASC) 2.5 MG tablet Take 5 mg by mouth daily.  ? Ascorbic Acid (VITAMIN C) 500 MG CAPS Take by mouth.  ? aspirin EC 81 MG tablet Take 81 mg by mouth daily.  ? atorvastatin (LIPITOR) 10 MG tablet Take 10 mg by mouth daily.  ? cholecalciferol (  VITAMIN D3) 25 MCG (1000 UNIT) tablet Take 1,000 Units by mouth daily.  ? fluticasone (FLONASE) 50 MCG/ACT nasal spray Place into both nostrils.  ? Glucosamine-Chondroit-Vit C-Mn (GLUCOSAMINE 1500 COMPLEX PO) Take 1 tablet by mouth daily.  ? hydrochlorothiazide (HYDRODIURIL) 25 MG tablet Take 1 tablet (25 mg total) by mouth daily. Patient must keep appointment for 07/12/21 for further refills. 1 st attempt  ? Multiple  Vitamin (MULTIVITAMIN) tablet Take 1 tablet by mouth daily.  ? Omega-3 Fatty Acids (FISH OIL) 1000 MG CAPS Take by mouth.  ? sildenafil (REVATIO) 20 MG tablet Take 1 tablet by mouth as needed.  ? vitamin B-12 (CYANOCOBALAMIN) 500 MCG tablet Take 500 mcg by mouth daily.   ? zolpidem (AMBIEN) 10 MG tablet Take 10 mg by mouth at bedtime as needed for sleep.   ?  ? ?Allergies:   Shellfish allergy, Bactrim [sulfamethoxazole-trimethoprim], Iodine, and Other  ? ?Social History  ? ?Socioeconomic History  ? Marital status: Married  ?  Spouse name: Not on file  ? Number of children: Not on file  ? Years of education: Not on file  ? Highest education level: Not on file  ?Occupational History  ? Not on file  ?Tobacco Use  ? Smoking status: Never  ? Smokeless tobacco: Never  ?Vaping Use  ? Vaping Use: Never used  ?Substance and Sexual Activity  ? Alcohol use: Never  ? Drug use: Never  ? Sexual activity: Yes  ?Other Topics Concern  ? Not on file  ?Social History Narrative  ? Not on file  ? ?Social Determinants of Health  ? ?Financial Resource Strain: Not on file  ?Food Insecurity: Not on file  ?Transportation Needs: Not on file  ?Physical Activity: Not on file  ?Stress: Not on file  ?Social Connections: Not on file  ?  ? ?Family History: ?The patient's family history includes Breast cancer in his mother; Cancer in his father and mother; Heart attack in his maternal grandfather and paternal grandmother; Heart disease in his paternal grandfather and paternal grandmother. ?ROS:   ?Please see the history of present illness.    ?All other systems reviewed and are negative. ? ?EKGs/Labs/Other Studies Reviewed:   ? ?The following studies were reviewed today: ? ?EKG: 2 rhythm strips reviewed from his smart phone both sinus rhythm ? ?Recent Labs: ?No results found for requested labs within last 8760 hours.  ?See history ? ? ?Physical Exam:   ? ?VS:  BP 140/60   Pulse (!) 53   Ht '5\' 11"'$  (1.803 m)   Wt 262 lb 12.8 oz (119.2 kg)   SpO2  98%   BMI 36.65 kg/m?    ? ?Wt Readings from Last 3 Encounters:  ?07/12/21 262 lb 12.8 oz (119.2 kg)  ?10/25/20 258 lb (117 kg)  ?06/15/20 256 lb 12.8 oz (116.5 kg)  ?  ? ?GEN:  Well nourished, well developed in no acute distress ?HEENT: Normal ?NECK: No JVD; No carotid bruits ?LYMPHATICS: No lymphadenopathy ?CARDIAC: S2 paradoxical RRR, no murmurs, rubs, gallops ?RESPIRATORY:  Clear to auscultation without rales, wheezing or rhonchi  ?ABDOMEN: Soft, non-tender, non-distended ?MUSCULOSKELETAL:  No edema; No deformity  ?SKIN: Warm and dry ?NEUROLOGIC:  Alert and oriented x 3 ?PSYCHIATRIC:  Normal affect  ? ? ?Signed, ?Shirlee More, MD  ?07/12/2021 9:04 AM    ?[place]  ?

## 2021-07-12 ENCOUNTER — Ambulatory Visit (INDEPENDENT_AMBULATORY_CARE_PROVIDER_SITE_OTHER): Payer: Medicare PPO | Admitting: Cardiology

## 2021-07-12 ENCOUNTER — Encounter: Payer: Self-pay | Admitting: Cardiology

## 2021-07-12 VITALS — BP 140/60 | HR 53 | Ht 71.0 in | Wt 262.8 lb

## 2021-07-12 DIAGNOSIS — I493 Ventricular premature depolarization: Secondary | ICD-10-CM

## 2021-07-12 DIAGNOSIS — I10 Essential (primary) hypertension: Secondary | ICD-10-CM | POA: Diagnosis not present

## 2021-07-12 DIAGNOSIS — I447 Left bundle-branch block, unspecified: Secondary | ICD-10-CM | POA: Diagnosis not present

## 2021-07-12 DIAGNOSIS — R55 Syncope and collapse: Secondary | ICD-10-CM

## 2021-07-12 DIAGNOSIS — E782 Mixed hyperlipidemia: Secondary | ICD-10-CM | POA: Diagnosis not present

## 2021-07-12 MED ORDER — HYDROCHLOROTHIAZIDE 25 MG PO TABS: 25.0000 mg | ORAL_TABLET | Freq: Every day | ORAL | 3 refills | Status: DC

## 2021-07-12 NOTE — Patient Instructions (Signed)
Medication Instructions:  ?Your physician recommends that you continue on your current medications as directed. Please refer to the Current Medication list given to you today. ? ?*If you need a refill on your cardiac medications before your next appointment, please call your pharmacy* ? ? ?Lab Work: ?none ?If you have labs (blood work) drawn today and your tests are completely normal, you will receive your results only by: ?MyChart Message (if you have MyChart) OR ?A paper copy in the mail ?If you have any lab test that is abnormal or we need to change your treatment, we will call you to review the results. ? ? ?Testing/Procedures: ?none ? ? ?Follow-Up: ?At [facility], you and your health needs are our priority.  As part of our continuing mission to provide you with exceptional heart care, we have created designated Provider Care Teams.  These Care Teams include your primary Cardiologist (physician) and Advanced Practice Providers (APPs -  Physician Assistants and Nurse Practitioners) who all work together to provide you with the care you need, when you need it. ? ?We recommend signing up for the patient portal called "MyChart".  Sign up information is provided on this After Visit Summary.  MyChart is used to connect with patients for Virtual Visits (Telemedicine).  Patients are able to view lab/test results, encounter notes, upcoming appointments, etc.  Non-urgent messages can be sent to your provider as well.   ?To learn more about what you can do with MyChart, go to [email or web address].   ? ?Your next appointment:   ?9 month(s) ? ?The format for your next appointment:   ?In Person ? ?Provider:   ?Shirlee More, MD  ? ? ?Other Instructions ?None ? ?Important Information About Sugar ? ? ? ? ? ? ?

## 2021-07-19 DIAGNOSIS — H401132 Primary open-angle glaucoma, bilateral, moderate stage: Secondary | ICD-10-CM | POA: Diagnosis not present

## 2021-07-20 DIAGNOSIS — L821 Other seborrheic keratosis: Secondary | ICD-10-CM | POA: Diagnosis not present

## 2021-07-20 DIAGNOSIS — D1801 Hemangioma of skin and subcutaneous tissue: Secondary | ICD-10-CM | POA: Diagnosis not present

## 2021-07-20 DIAGNOSIS — L814 Other melanin hyperpigmentation: Secondary | ICD-10-CM | POA: Diagnosis not present

## 2021-07-20 DIAGNOSIS — L570 Actinic keratosis: Secondary | ICD-10-CM | POA: Diagnosis not present

## 2021-07-20 DIAGNOSIS — D225 Melanocytic nevi of trunk: Secondary | ICD-10-CM | POA: Diagnosis not present

## 2021-07-20 DIAGNOSIS — Z85828 Personal history of other malignant neoplasm of skin: Secondary | ICD-10-CM | POA: Diagnosis not present

## 2021-09-25 ENCOUNTER — Encounter: Payer: Self-pay | Admitting: Cardiology

## 2021-09-26 ENCOUNTER — Telehealth: Payer: Self-pay

## 2021-09-26 ENCOUNTER — Ambulatory Visit (INDEPENDENT_AMBULATORY_CARE_PROVIDER_SITE_OTHER): Payer: Medicare PPO | Admitting: Cardiology

## 2021-09-26 ENCOUNTER — Encounter (HOSPITAL_BASED_OUTPATIENT_CLINIC_OR_DEPARTMENT_OTHER): Payer: Self-pay

## 2021-09-26 ENCOUNTER — Other Ambulatory Visit: Payer: Self-pay

## 2021-09-26 ENCOUNTER — Ambulatory Visit (INDEPENDENT_AMBULATORY_CARE_PROVIDER_SITE_OTHER): Payer: Medicare PPO

## 2021-09-26 ENCOUNTER — Telehealth: Payer: Self-pay | Admitting: Cardiology

## 2021-09-26 ENCOUNTER — Emergency Department (HOSPITAL_BASED_OUTPATIENT_CLINIC_OR_DEPARTMENT_OTHER): Payer: Medicare PPO

## 2021-09-26 ENCOUNTER — Encounter: Payer: Self-pay | Admitting: Cardiology

## 2021-09-26 ENCOUNTER — Inpatient Hospital Stay (HOSPITAL_BASED_OUTPATIENT_CLINIC_OR_DEPARTMENT_OTHER)
Admission: EM | Admit: 2021-09-26 | Discharge: 2021-09-30 | DRG: 244 | Disposition: A | Discharge status: Home / Self Care | Payer: Medicare PPO | Attending: Internal Medicine | Admitting: Internal Medicine

## 2021-09-26 VITALS — BP 152/70 | HR 59 | Ht 71.0 in | Wt 269.8 lb

## 2021-09-26 DIAGNOSIS — I480 Paroxysmal atrial fibrillation: Secondary | ICD-10-CM | POA: Diagnosis not present

## 2021-09-26 DIAGNOSIS — Z6837 Body mass index (BMI) 37.0-37.9, adult: Secondary | ICD-10-CM | POA: Diagnosis not present

## 2021-09-26 DIAGNOSIS — N529 Male erectile dysfunction, unspecified: Secondary | ICD-10-CM | POA: Insufficient documentation

## 2021-09-26 DIAGNOSIS — Z8249 Family history of ischemic heart disease and other diseases of the circulatory system: Secondary | ICD-10-CM

## 2021-09-26 DIAGNOSIS — Z7982 Long term (current) use of aspirin: Secondary | ICD-10-CM

## 2021-09-26 DIAGNOSIS — K5730 Diverticulosis of large intestine without perforation or abscess without bleeding: Secondary | ICD-10-CM

## 2021-09-26 DIAGNOSIS — R55 Syncope and collapse: Secondary | ICD-10-CM | POA: Diagnosis not present

## 2021-09-26 DIAGNOSIS — E7800 Pure hypercholesterolemia, unspecified: Secondary | ICD-10-CM | POA: Diagnosis not present

## 2021-09-26 DIAGNOSIS — M5431 Sciatica, right side: Secondary | ICD-10-CM | POA: Insufficient documentation

## 2021-09-26 DIAGNOSIS — Z950 Presence of cardiac pacemaker: Secondary | ICD-10-CM | POA: Diagnosis not present

## 2021-09-26 DIAGNOSIS — I10 Essential (primary) hypertension: Secondary | ICD-10-CM

## 2021-09-26 DIAGNOSIS — Z9049 Acquired absence of other specified parts of digestive tract: Secondary | ICD-10-CM

## 2021-09-26 DIAGNOSIS — Z882 Allergy status to sulfonamides status: Secondary | ICD-10-CM | POA: Diagnosis not present

## 2021-09-26 DIAGNOSIS — Z803 Family history of malignant neoplasm of breast: Secondary | ICD-10-CM | POA: Diagnosis not present

## 2021-09-26 DIAGNOSIS — I442 Atrioventricular block, complete: Secondary | ICD-10-CM | POA: Diagnosis present

## 2021-09-26 DIAGNOSIS — Z79899 Other long term (current) drug therapy: Secondary | ICD-10-CM

## 2021-09-26 DIAGNOSIS — I472 Ventricular tachycardia, unspecified: Secondary | ICD-10-CM | POA: Diagnosis present

## 2021-09-26 DIAGNOSIS — R7303 Prediabetes: Secondary | ICD-10-CM | POA: Insufficient documentation

## 2021-09-26 DIAGNOSIS — Z888 Allergy status to other drugs, medicaments and biological substances status: Secondary | ICD-10-CM

## 2021-09-26 DIAGNOSIS — E669 Obesity, unspecified: Secondary | ICD-10-CM | POA: Diagnosis present

## 2021-09-26 DIAGNOSIS — R079 Chest pain, unspecified: Secondary | ICD-10-CM | POA: Diagnosis not present

## 2021-09-26 DIAGNOSIS — E785 Hyperlipidemia, unspecified: Secondary | ICD-10-CM | POA: Diagnosis present

## 2021-09-26 DIAGNOSIS — H9313 Tinnitus, bilateral: Secondary | ICD-10-CM | POA: Insufficient documentation

## 2021-09-26 DIAGNOSIS — G4700 Insomnia, unspecified: Secondary | ICD-10-CM | POA: Insufficient documentation

## 2021-09-26 DIAGNOSIS — R001 Bradycardia, unspecified: Secondary | ICD-10-CM | POA: Diagnosis present

## 2021-09-26 DIAGNOSIS — R42 Dizziness and giddiness: Secondary | ICD-10-CM | POA: Diagnosis not present

## 2021-09-26 DIAGNOSIS — R9431 Abnormal electrocardiogram [ECG] [EKG]: Secondary | ICD-10-CM | POA: Diagnosis not present

## 2021-09-26 DIAGNOSIS — I2510 Atherosclerotic heart disease of native coronary artery without angina pectoris: Secondary | ICD-10-CM | POA: Diagnosis not present

## 2021-09-26 HISTORY — DX: Syncope and collapse: R55

## 2021-09-26 HISTORY — DX: Tinnitus, bilateral: H93.13

## 2021-09-26 HISTORY — DX: Sciatica, right side: M54.31

## 2021-09-26 HISTORY — DX: Insomnia, unspecified: G47.00

## 2021-09-26 HISTORY — DX: Diverticulosis of large intestine without perforation or abscess without bleeding: K57.30

## 2021-09-26 HISTORY — DX: Male erectile dysfunction, unspecified: N52.9

## 2021-09-26 HISTORY — DX: Pure hypercholesterolemia, unspecified: E78.00

## 2021-09-26 HISTORY — DX: Prediabetes: R73.03

## 2021-09-26 LAB — CBC
HCT: 43.6 % (ref 39.0–52.0)
Hemoglobin: 15 g/dL (ref 13.0–17.0)
MCH: 31.3 pg (ref 26.0–34.0)
MCHC: 34.4 g/dL (ref 30.0–36.0)
MCV: 90.8 fL (ref 80.0–100.0)
Platelets: 199 10*3/uL (ref 150–400)
RBC: 4.8 MIL/uL (ref 4.22–5.81)
RDW: 13.4 % (ref 11.5–15.5)
WBC: 7.5 10*3/uL (ref 4.0–10.5)
nRBC: 0 % (ref 0.0–0.2)

## 2021-09-26 LAB — BASIC METABOLIC PANEL
Anion gap: 8 (ref 5–15)
BUN: 20 mg/dL (ref 8–23)
CO2: 28 mmol/L (ref 22–32)
Calcium: 9.1 mg/dL (ref 8.9–10.3)
Chloride: 105 mmol/L (ref 98–111)
Creatinine, Ser: 1.12 mg/dL (ref 0.61–1.24)
GFR, Estimated: >60 mL/min (ref >60)
Glucose, Bld: 189 mg/dL — ABNORMAL HIGH (ref 70–99)
Potassium: 4.1 mmol/L (ref 3.5–5.1)
Sodium: 141 mmol/L (ref 135–145)

## 2021-09-26 LAB — TROPONIN I (HIGH SENSITIVITY)
Troponin I (High Sensitivity): 10 ng/L (ref <18)
Troponin I (High Sensitivity): 11 ng/L (ref <18)

## 2021-09-26 LAB — MAGNESIUM: Magnesium: 2.1 mg/dL (ref 1.7–2.4)

## 2021-09-26 NOTE — ED Provider Notes (Signed)
Emergency Department Provider Note   I have reviewed the triage vital signs and the nursing notes.   HISTORY  Chief Complaint Bradycardia   HPI Dylan Zimmerman is a 81 y.o. male with past medical history reviewed below presents emergency department for evaluation of near syncope.  He saw cardiologist today who placed a ambulatory monitor.  Shortly after leaving the appointment he had a near syncope event where he had to lean against the car to keep from falling but did not lose consciousness.  He denies any discomfort in the chest.  States he was called a short time later saying that he had an abnormal heart rhythm and needed to go to the emergency department for further evaluation.  Review of the telephone encounters from today describe just over 5 seconds of complete heart block.  He states he is feeling better now that he is having some mild weakness.  Occasionally some discomfort between the shoulder blades but not currently.    Past Medical History:  Diagnosis Date   Back pain at L4-L5 level 12/07/2018   Cholelithiasis with chronic cholecystitis 04/20/2019   Chronic cholecystitis    Colorectal polyp detected on colonoscopy 01/01/2020   Elevated liver enzymes 04/20/2019   Essential hypertension 06/18/2018   Hyperlipidemia    Hyperlipidemia    Hypertension    Obesity 06/18/2018   Palpitations 06/18/2018   PVC's (premature ventricular contractions) 08/21/2018   Syncope and collapse     Review of Systems  Constitutional: No fever/chills Eyes: No visual changes. ENT: No sore throat. Cardiovascular: Denies chest pain. Positive near syncope.  Respiratory: Denies shortness of breath. Gastrointestinal: No abdominal pain.  No nausea, no vomiting.  No diarrhea.  No constipation. Genitourinary: Negative for dysuria. Musculoskeletal: Negative for back pain. Skin: Negative for rash. Neurological: Negative for headaches, focal weakness or  numbness.  ____________________________________________   PHYSICAL EXAM:  VITAL SIGNS: ED Triage Vitals  Enc Vitals Group     BP 09/26/21 1823 (!) 183/77     Pulse Rate 09/26/21 1823 60     Resp 09/26/21 1823 18     Temp 09/26/21 1823 98.1 F (36.7 C)     Temp Source 09/26/21 1823 Oral     SpO2 09/26/21 1823 98 %     Weight 09/26/21 1819 269 lb 12.8 oz (122.4 kg)     Height 09/26/21 1819 '5\' 11"'$  (1.803 m)   Constitutional: Alert and oriented. Well appearing and in no acute distress. Eyes: Conjunctivae are normal.  Head: Atraumatic. Nose: No congestion/rhinnorhea. Mouth/Throat: Mucous membranes are moist.   Neck: No stridor.  Cardiovascular: Normal rate, regular rhythm. Good peripheral circulation. Grossly normal heart sounds.   Respiratory: Normal respiratory effort.  No retractions. Lungs CTAB. Gastrointestinal: Soft and nontender. No distention.  Musculoskeletal: No lower extremity tenderness nor edema. No gross deformities of extremities. Neurologic:  Normal speech and language. No gross focal neurologic deficits are appreciated.  Skin:  Skin is warm, dry and intact. No rash noted.  ____________________________________________   LABS (all labs ordered are listed, but only abnormal results are displayed)  Labs Reviewed  BASIC METABOLIC PANEL - Abnormal; Notable for the following components:      Result Value   Glucose, Bld 189 (*)    All other components within normal limits  CBC  MAGNESIUM  TSH  TROPONIN I (HIGH SENSITIVITY)  TROPONIN I (HIGH SENSITIVITY)   ____________________________________________  EKG   EKG Interpretation  Date/Time:  Monday September 26 2021 18:22:00 EDT Ventricular Rate:  74 PR Interval:  210 QRS Duration: 156 QT Interval:  442 QTC Calculation: 490 R Axis:   -70 Text Interpretation: Sinus rhythm with 1st degree A-V block with occasional Premature ventricular complexes Left axis deviation Left bundle branch block Abnormal ECG When  compared with ECG of 16-Apr-2000 10:30, PREVIOUS ECG IS PRESENT Confirmed by Nanda Quinton [phone]) on 09/26/2021 6:24:18 PM        ____________________________________________  RADIOLOGY  DG Chest 2 View  Result Date: 09/26/2021 CLINICAL DATA:  Intermittent pain between the shoulder blades EXAM: CHEST - 2 VIEW COMPARISON:  09/29/2008 FINDINGS: The heart size and mediastinal contours are within normal limits. Both lungs are clear. Degenerative changes of the spine. IMPRESSION: No active cardiopulmonary disease. Electronically Signed   By: Donavan Foil M.D.   On: 09/26/2021 19:23    ____________________________________________   PROCEDURES  Procedure(s) performed:   Procedures  CRITICAL CARE Performed by: Margette Fast Total critical care time: 35 minutes Critical care time was exclusive of separately billable procedures and treating other patients. Critical care was necessary to treat or prevent imminent or life-threatening deterioration. Critical care was time spent personally by me on the following activities: development of treatment plan with patient and/or surrogate as well as nursing, discussions with consultants, evaluation of patient's response to treatment, examination of patient, obtaining history from patient or surrogate, ordering and performing treatments and interventions, ordering and review of laboratory studies, ordering and review of radiographic studies, pulse oximetry and re-evaluation of patient's condition.  Nanda Quinton, MD Emergency Medicine  ____________________________________________   INITIAL IMPRESSION / ASSESSMENT AND PLAN / ED COURSE  Pertinent labs & imaging results that were available during my care of the patient were reviewed by me and considered in my medical decision making (see chart for details).   This patient is Presenting for Evaluation of near syncope, which does require a range of treatment options, and is a complaint that involves a high  risk of morbidity and mortality.  The Differential Diagnoses include arrhythmia, dehydration, PE, ACS, dissection.   I did obtain Additional Historical Information from wife at bedside.  I decided to review pertinent External Data, and in summary unable to obtain fax from ambulatory monitor from today.   Clinical Laboratory Tests Ordered, included troponin within normal limits.  TSH pending.  Magnesium and potassium are normal.  No acute kidney injury or anemia.  Radiologic Tests Ordered, included CXR. I independently interpreted the images and agree with radiology interpretation.   Cardiac Monitor Tracing which shows NSR.    Social Determinants of Health Risk patient is a non-smoker.   Consult complete with Cardiology, Dr. Marcelle Smiling. Plan for admit. Placed temporary orders at his request.   Medical Decision Making: Summary:  Patient presents emergency department with near syncope and heart block noted on ambulatory monitor.  Unable to review the strips from earlier today but sounds like in review of the telephone encounter that the patient has some third-degree block.  No hypotension or symptoms currently.  No clear electrolyte disturbance or clear evidence of ischemia.  Plan for cardiology admit.  Reevaluation with update and discussion with patient. Plan for admit. He is in agreement with plan.   Disposition: admit  ____________________________________________  FINAL CLINICAL IMPRESSION(S) / ED DIAGNOSES  Final diagnoses:  Near syncope    Note:  This document was prepared using Dragon voice recognition software and may include unintentional dictation errors.  Nanda Quinton, MD, [facility] Emergency Medicine    Ciclaly Mulcahey, W[facility], MD 09/26/21  [id number]  

## 2021-09-26 NOTE — ED Notes (Signed)
[facility] given report.  

## 2021-09-26 NOTE — Telephone Encounter (Signed)
Spoke with pt and advised per Dr. Geraldo Pitter to go to the ER for a critical reading on the [employer] monitor that was called today. Pt stated that he would go. Will scan strip to computer for ER to see.

## 2021-09-26 NOTE — Telephone Encounter (Signed)
Dylan Zimmerman with [employer] calling with abnormal ekg.

## 2021-09-26 NOTE — Telephone Encounter (Signed)
Message taken from Mitzi Hansen from [facility] sent to Dr. Geraldo Pitter and verbal message to Dr. Geraldo Pitter as well.

## 2021-09-26 NOTE — Telephone Encounter (Signed)
Fax box checked for [employer] strip. No fax received.

## 2021-09-26 NOTE — Telephone Encounter (Signed)
Called [employer] customer service. They stated that they would fax critical strip. Fax box checked x 3 and last at 5:45 pm. No fax from [employer] available.

## 2021-09-26 NOTE — ED Triage Notes (Addendum)
States HR was down to 39 yesterday for 4 hours. Had external cardiac monitor placed today and was told his heart stopped for 5 seconds at around 1300. States he felt lightheaded during episode. Denies chest pain. Intermittent pain in between shoulder blades the past few weeks

## 2021-09-26 NOTE — Patient Instructions (Signed)
Medication Instructions:  Your physician recommends that you continue on your current medications as directed. Please refer to the Current Medication list given to you today.  *If you need a refill on your cardiac medications before your next appointment, please call your pharmacy*   Lab Work: Your physician recommends that you return for lab work in: Today for TSH  If you have labs (blood work) drawn today and your tests are completely normal, you will receive your results only by: MyChart Message (if you have MyChart) OR A paper copy in the mail If you have any lab test that is abnormal or we need to change your treatment, we will call you to review the results.   Testing/Procedures: You are wearing an event heart monitor to be worn for 30 days. When finished need to send it back UPS or call and have it picked up.   Follow-Up: At [facility], you and your health needs are our priority.  As part of our continuing mission to provide you with exceptional heart care, we have created designated Provider Care Teams.  These Care Teams include your primary Cardiologist (physician) and Advanced Practice Providers (APPs -  Physician Assistants and Nurse Practitioners) who all work together to provide you with the care you need, when you need it.  We recommend signing up for the patient portal called "MyChart".  Sign up information is provided on this After Visit Summary.  MyChart is used to connect with patients for Virtual Visits (Telemedicine).  Patients are able to view lab/test results, encounter notes, upcoming appointments, etc.  Non-urgent messages can be sent to your provider as well.   To learn more about what you can do with MyChart, go to [email or web address].    Your next appointment:   3 month(s)  The format for your next appointment:   In Person  Provider:   Jyl Heinz, MD    Other Instructions   Important Information About Sugar

## 2021-09-26 NOTE — Telephone Encounter (Signed)
Received call from [employer]- At 1:04PM today they recorded a 3rd degree AV block lasting 5.5 seconds. It showed as a pause and P waves.  They reached out to the patient and he was up walking around and reported a little dizziness. Sinus Bradycardia @ 54bpm otherwise.

## 2021-09-26 NOTE — Progress Notes (Signed)
Cardiology Office Note:    Date:  09/26/2021   ID:  Dylan Zimmerman, DOB 09/09/1940, MRN [id number]  PCP:  Lujean Amel, MD  Cardiologist:  Jenean Lindau, MD   Referring MD: Lujean Amel, MD    ASSESSMENT:    1. Essential hypertension   2. Pure hypercholesterolemia   3. Dizziness    PLAN:    In order of problems listed above:  Primary prevention stressed with the patient.  Importance of compliance with diet medication stressed and vocalized understanding. Dizziness: Unclear etiology.  EKG is abnormal as mentioned below in view of first-degree AV block and intraventricular conduction delay I would recommend a 1 month monitor.  I will also do a TSH.  He is agreeable.  He knows to go to the nearest emergency room for any significant concerns. Essential hypertension: Blood pressure stable and diet was emphasized.  Lifestyle modification urged. Obesity and hyperlipidemia: Followed by primary care.  Diet emphasized. He will be seen in follow-up appointment in 3 months or earlier if he has any concerns.  Patient and wife had multiple questions which were answered to their satisfaction.   Medication Adjustments/Labs and Tests Ordered: Current medicines are reviewed at length with the patient today.  Concerns regarding medicines are outlined above.  No orders of the defined types were placed in this encounter.  No orders of the defined types were placed in this encounter.    Chief Complaint  Patient presents with   Dizziness   Low HR    30-40'S     History of Present Illness:    Dylan Zimmerman is a 81 y.o. male patient is a pleasant 81 year old male.  He is previously unknown to me.  He follows Dr. Bettina Gavia.  He has had history of syncopal episodes in the past.  He has had event monitoring and loop recorder implanted and explanted last year.  This was unrevealing.  Patient mentions to me that he was feeling "very weak in his head" and his son-in-law who was with him noticed his  heart rate to be slow and wanted him to be seen so he came in this morning.  Patient mentions to me that since that episode he has felt fine.  He is feeling fine this morning.  Since a long time he has not had any syncope or any such issues.  No chest pain orthopnea or PND.  His wife accompanies him for this visit.  Past Medical History:  Diagnosis Date   Back pain at L4-L5 level 12/07/2018   Cholelithiasis with chronic cholecystitis 04/20/2019   Chronic cholecystitis    Colorectal polyp detected on colonoscopy 01/01/2020   Elevated liver enzymes 04/20/2019   Essential hypertension 06/18/2018   Hyperlipidemia    Hyperlipidemia    Hypertension    Obesity 06/18/2018   Palpitations 06/18/2018   PVC's (premature ventricular contractions) 08/21/2018   Syncope and collapse     Past Surgical History:  Procedure Laterality Date   CATARACT EXTRACTION Bilateral    CHOLECYSTECTOMY N/A 04/21/2019   Procedure: LAPAROSCOPIC CHOLECYSTECTOMY WITH INTRAOPERATIVE CHOLANGIOGRAM;  Surgeon: Armandina Gemma, MD;  Location: [facility];  Service: General;  Laterality: N/A;   LITHOTRIPSY     LOOP RECORDER INSERTION N/A 10/04/2018   Procedure: LOOP RECORDER INSERTION;  Surgeon: Constance Haw, MD;  Location: [place] [facility];  Service: Cardiovascular;  Laterality: N/A;   TONSILLECTOMY      Current Medications: Current Meds  Medication Sig   amLODipine (NORVASC) 2.5 MG  tablet Take 5 mg by mouth daily.   Ascorbic Acid (VITAMIN C) 500 MG CAPS Take 1 Capful by mouth daily.   aspirin EC 81 MG tablet Take 81 mg by mouth daily.   atorvastatin (LIPITOR) 10 MG tablet Take 10 mg by mouth daily.   cholecalciferol (VITAMIN D3) 25 MCG (1000 UNIT) tablet Take 1,000 Units by mouth daily.   fluticasone (FLONASE) 50 MCG/ACT nasal spray Place 1 spray into both nostrils daily.   Glucosamine-Chondroit-Vit C-Mn (GLUCOSAMINE 1500 COMPLEX PO) Take 1 tablet by mouth daily.   hydrochlorothiazide (HYDRODIURIL) 25 MG  tablet Take 1 tablet (25 mg total) by mouth daily.   Multiple Vitamin (MULTIVITAMIN) tablet Take 1 tablet by mouth daily.   Omega-3 Fatty Acids (FISH OIL) 1000 MG CAPS Take by mouth.   sildenafil (REVATIO) 20 MG tablet Take 1 tablet by mouth as needed (ED).   vitamin B-12 (CYANOCOBALAMIN) 500 MCG tablet Take 500 mcg by mouth daily.    zolpidem (AMBIEN) 10 MG tablet Take 10 mg by mouth at bedtime as needed for sleep.      Allergies:   Shellfish allergy, Bactrim [sulfamethoxazole-trimethoprim], Iodine, and Other   Social History   Socioeconomic History   Marital status: Married    Spouse name: Not on file   Number of children: Not on file   Years of education: Not on file   Highest education level: Not on file  Occupational History   Not on file  Tobacco Use   Smoking status: Never   Smokeless tobacco: Never  Vaping Use   Vaping Use: Never used  Substance and Sexual Activity   Alcohol use: Never   Drug use: Never   Sexual activity: Yes  Other Topics Concern   Not on file  Social History Narrative   Not on file   Social Determinants of Health   Financial Resource Strain: Not on file  Food Insecurity: Not on file  Transportation Needs: Not on file  Physical Activity: Not on file  Stress: Not on file  Social Connections: Not on file     Family History: The patient's family history includes Breast cancer in his mother; Cancer in his father and mother; Heart attack in his maternal grandfather and paternal grandmother; Heart disease in his paternal grandfather and paternal grandmother.  ROS:   Please see the history of present illness.    All other systems reviewed and are negative.  EKGs/Labs/Other Studies Reviewed:    The following studies were reviewed today: EKG reveals sinus rhythm first-degree AV block and left bundle branch block.   Recent Labs: No results found for requested labs within last 365 days.  Recent Lipid Panel    Component Value Date/Time    CHOL 163 01/14/2020 0920   TRIG 322 (H) 01/14/2020 0920   HDL 37 (L) 01/14/2020 0920   CHOLHDL 4.4 01/14/2020 0920   LDLCALC 74 01/14/2020 0920    Physical Exam:    VS:  BP (!) 152/70 (BP Location: Left Arm, Patient Position: Sitting)   Pulse (!) 59   Ht '5\' 11"'$  (1.803 m)   Wt 269 lb 12.8 oz (122.4 kg)   SpO2 94%   BMI 37.63 kg/m     Wt Readings from Last 3 Encounters:  09/26/21 269 lb 12.8 oz (122.4 kg)  07/12/21 262 lb 12.8 oz (119.2 kg)  10/25/20 258 lb (117 kg)     GEN: Patient is in no acute distress HEENT: Normal NECK: No JVD; No carotid bruits LYMPHATICS: No  lymphadenopathy CARDIAC: Hear sounds regular, 2/6 systolic murmur at the apex. RESPIRATORY:  Clear to auscultation without rales, wheezing or rhonchi  ABDOMEN: Soft, non-tender, non-distended MUSCULOSKELETAL:  No edema; No deformity  SKIN: Warm and dry NEUROLOGIC:  Alert and oriented x 3 PSYCHIATRIC:  Normal affect   Signed, Jenean Lindau, MD  09/26/2021 11:41 AM    [place]

## 2021-09-27 ENCOUNTER — Other Ambulatory Visit (HOSPITAL_COMMUNITY): Payer: Medicare PPO

## 2021-09-27 DIAGNOSIS — R001 Bradycardia, unspecified: Secondary | ICD-10-CM | POA: Diagnosis present

## 2021-09-27 DIAGNOSIS — I10 Essential (primary) hypertension: Secondary | ICD-10-CM

## 2021-09-27 DIAGNOSIS — I480 Paroxysmal atrial fibrillation: Secondary | ICD-10-CM | POA: Diagnosis not present

## 2021-09-27 DIAGNOSIS — Z8249 Family history of ischemic heart disease and other diseases of the circulatory system: Secondary | ICD-10-CM | POA: Diagnosis not present

## 2021-09-27 DIAGNOSIS — Z803 Family history of malignant neoplasm of breast: Secondary | ICD-10-CM | POA: Diagnosis not present

## 2021-09-27 DIAGNOSIS — Z9049 Acquired absence of other specified parts of digestive tract: Secondary | ICD-10-CM | POA: Diagnosis not present

## 2021-09-27 DIAGNOSIS — Z7982 Long term (current) use of aspirin: Secondary | ICD-10-CM | POA: Diagnosis not present

## 2021-09-27 DIAGNOSIS — Z6837 Body mass index (BMI) 37.0-37.9, adult: Secondary | ICD-10-CM | POA: Diagnosis not present

## 2021-09-27 DIAGNOSIS — I472 Ventricular tachycardia, unspecified: Secondary | ICD-10-CM | POA: Diagnosis present

## 2021-09-27 DIAGNOSIS — I442 Atrioventricular block, complete: Secondary | ICD-10-CM | POA: Diagnosis present

## 2021-09-27 DIAGNOSIS — E669 Obesity, unspecified: Secondary | ICD-10-CM | POA: Diagnosis present

## 2021-09-27 DIAGNOSIS — R55 Syncope and collapse: Secondary | ICD-10-CM | POA: Diagnosis not present

## 2021-09-27 DIAGNOSIS — Z79899 Other long term (current) drug therapy: Secondary | ICD-10-CM | POA: Diagnosis not present

## 2021-09-27 DIAGNOSIS — I2510 Atherosclerotic heart disease of native coronary artery without angina pectoris: Secondary | ICD-10-CM | POA: Diagnosis not present

## 2021-09-27 DIAGNOSIS — Z882 Allergy status to sulfonamides status: Secondary | ICD-10-CM | POA: Diagnosis not present

## 2021-09-27 DIAGNOSIS — Z888 Allergy status to other drugs, medicaments and biological substances status: Secondary | ICD-10-CM | POA: Diagnosis not present

## 2021-09-27 DIAGNOSIS — R9431 Abnormal electrocardiogram [ECG] [EKG]: Secondary | ICD-10-CM | POA: Diagnosis not present

## 2021-09-27 DIAGNOSIS — E785 Hyperlipidemia, unspecified: Secondary | ICD-10-CM | POA: Diagnosis present

## 2021-09-27 HISTORY — DX: Atrioventricular block, complete: I44.2

## 2021-09-27 LAB — BASIC METABOLIC PANEL
Anion gap: 10 (ref 5–15)
BUN: 20 mg/dL (ref 8–23)
CO2: 21 mmol/L — ABNORMAL LOW (ref 22–32)
Calcium: 8.6 mg/dL — ABNORMAL LOW (ref 8.9–10.3)
Chloride: 109 mmol/L (ref 98–111)
Creatinine, Ser: 1.02 mg/dL (ref 0.61–1.24)
GFR, Estimated: >60 mL/min (ref >60)
Glucose, Bld: 113 mg/dL — ABNORMAL HIGH (ref 70–99)
Potassium: 3.7 mmol/L (ref 3.5–5.1)
Sodium: 140 mmol/L (ref 135–145)

## 2021-09-27 LAB — CBC
HCT: 39.9 % (ref 39.0–52.0)
Hemoglobin: 13.9 g/dL (ref 13.0–17.0)
MCH: 31.6 pg (ref 26.0–34.0)
MCHC: 34.8 g/dL (ref 30.0–36.0)
MCV: 90.7 fL (ref 80.0–100.0)
Platelets: 161 10*3/uL (ref 150–400)
RBC: 4.4 MIL/uL (ref 4.22–5.81)
RDW: 13.3 % (ref 11.5–15.5)
WBC: 7.5 10*3/uL (ref 4.0–10.5)
nRBC: 0 % (ref 0.0–0.2)

## 2021-09-27 LAB — LIPID PANEL
Cholesterol: 135 mg/dL (ref 0–200)
HDL: 36 mg/dL — ABNORMAL LOW (ref >40)
LDL Cholesterol: 74 mg/dL (ref 0–99)
Total CHOL/HDL Ratio: 3.8 RATIO
Triglycerides: 123 mg/dL (ref <150)
VLDL: 25 mg/dL (ref 0–40)

## 2021-09-27 LAB — HEMOGLOBIN A1C
Hgb A1c MFr Bld: 6.2 % — ABNORMAL HIGH (ref 4.8–5.6)
Mean Plasma Glucose: 131.24 mg/dL

## 2021-09-27 LAB — T4, FREE: Free T4: 1.02 ng/dL (ref 0.61–1.12)

## 2021-09-27 LAB — BRAIN NATRIURETIC PEPTIDE: B Natriuretic Peptide: 33.8 pg/mL (ref 0.0–100.0)

## 2021-09-27 LAB — PROTIME-INR
INR: 1 (ref 0.8–1.2)
Prothrombin Time: 13.5 seconds (ref 11.4–15.2)

## 2021-09-27 LAB — TSH
TSH: 3.5 u[IU]/mL (ref 0.450–4.500)
TSH: 3.088 u[IU]/mL (ref 0.350–4.500)

## 2021-09-27 MED ORDER — HYDROCHLOROTHIAZIDE 25 MG PO TABS
25.0000 mg | ORAL_TABLET | Freq: Every day | ORAL | Status: DC
  Administered 2021-09-27 – 2021-09-29 (×3): 25 mg via ORAL
  Filled 2021-09-27 (×3): qty 1

## 2021-09-27 MED ORDER — HEPARIN SODIUM (PORCINE) 5000 UNIT/ML IJ SOLN
5000.0000 [IU] | Freq: Three times a day (TID) | INTRAMUSCULAR | Status: DC
Start: 2021-09-27 — End: 2021-09-28
  Administered 2021-09-27 – 2021-09-28 (×3): 5000 [IU] via SUBCUTANEOUS
  Filled 2021-09-27 (×3): qty 1

## 2021-09-27 MED ORDER — PREDNISONE 50 MG PO TABS
50.0000 mg | ORAL_TABLET | Freq: Four times a day (QID) | ORAL | Status: AC
  Administered 2021-09-28 (×2): 50 mg via ORAL
  Filled 2021-09-27 (×2): qty 1

## 2021-09-27 MED ORDER — VITAMIN D 25 MCG (1000 UNIT) PO TABS
1000.0000 [IU] | ORAL_TABLET | Freq: Every day | ORAL | Status: DC
Start: 2021-09-27 — End: 2021-09-30
  Administered 2021-09-27 – 2021-09-30 (×4): 1000 [IU] via ORAL
  Filled 2021-09-27 (×4): qty 1

## 2021-09-27 MED ORDER — ACETAMINOPHEN 325 MG PO TABS: 650.0000 mg | ORAL_TABLET | ORAL | Status: DC | PRN

## 2021-09-27 MED ORDER — ONDANSETRON HCL 4 MG/2ML IJ SOLN: 4.0000 mg | Freq: Four times a day (QID) | INTRAMUSCULAR | Status: DC | PRN

## 2021-09-27 MED ORDER — AMLODIPINE BESYLATE 5 MG PO TABS
5.0000 mg | ORAL_TABLET | Freq: Every day | ORAL | Status: DC
Start: 2021-09-27 — End: 2021-09-29
  Administered 2021-09-27 – 2021-09-29 (×3): 5 mg via ORAL
  Filled 2021-09-27 (×3): qty 1

## 2021-09-27 MED ORDER — SODIUM CHLORIDE 0.9 % WEIGHT BASED INFUSION
3.0000 mL/kg/h | INTRAVENOUS | Status: AC
Start: 2021-09-28 — End: 2021-09-28

## 2021-09-27 MED ORDER — SODIUM CHLORIDE 0.9 % WEIGHT BASED INFUSION
1.0000 mL/kg/h | INTRAVENOUS | Status: DC
Start: 2021-09-28 — End: 2021-09-28
  Administered 2021-09-28 (×2): 1 mL/kg/h via INTRAVENOUS

## 2021-09-27 MED ORDER — ASPIRIN 81 MG PO CHEW
81.0000 mg | CHEWABLE_TABLET | ORAL | Status: AC
Start: 2021-09-28 — End: 2021-09-28
  Administered 2021-09-28: 81 mg via ORAL
  Filled 2021-09-27: qty 1

## 2021-09-27 MED ORDER — DIPHENHYDRAMINE HCL 50 MG/ML IJ SOLN
50.0000 mg | Freq: Once | INTRAMUSCULAR | Status: AC
  Filled 2021-09-27: qty 1

## 2021-09-27 MED ORDER — PREDNISONE 50 MG PO TABS: 50.0000 mg | ORAL_TABLET | Freq: Four times a day (QID) | ORAL | Status: DC

## 2021-09-27 MED ORDER — DIPHENHYDRAMINE HCL 50 MG/ML IJ SOLN: 50.0000 mg | Freq: Once | INTRAMUSCULAR | Status: DC

## 2021-09-27 MED ORDER — ATORVASTATIN CALCIUM 10 MG PO TABS
10.0000 mg | ORAL_TABLET | Freq: Every day | ORAL | Status: DC
  Administered 2021-09-27 – 2021-09-30 (×4): 10 mg via ORAL
  Filled 2021-09-27 (×4): qty 1

## 2021-09-27 MED ORDER — DIPHENHYDRAMINE HCL 25 MG PO CAPS: 50.0000 mg | ORAL_CAPSULE | Freq: Once | ORAL | Status: DC

## 2021-09-27 MED ORDER — SODIUM CHLORIDE 0.9% FLUSH: 3.0000 mL | INTRAVENOUS | Status: DC | PRN

## 2021-09-27 MED ORDER — DIPHENHYDRAMINE HCL 25 MG PO CAPS
50.0000 mg | ORAL_CAPSULE | Freq: Once | ORAL | Status: AC
  Administered 2021-09-28: 50 mg via ORAL
  Filled 2021-09-27: qty 2

## 2021-09-27 MED ORDER — SODIUM CHLORIDE 0.9% FLUSH
3.0000 mL | Freq: Two times a day (BID) | INTRAVENOUS | Status: DC
  Administered 2021-09-27 – 2021-09-30 (×5): 3 mL via INTRAVENOUS

## 2021-09-27 MED ORDER — ASPIRIN 81 MG PO TBEC
81.0000 mg | DELAYED_RELEASE_TABLET | Freq: Every day | ORAL | Status: DC
Start: 2021-09-27 — End: 2021-09-29
  Administered 2021-09-27 – 2021-09-29 (×3): 81 mg via ORAL
  Filled 2021-09-27 (×3): qty 1

## 2021-09-27 MED ORDER — SODIUM CHLORIDE 0.9 % IV SOLN: 250.0000 mL | INTRAVENOUS | Status: DC | PRN

## 2021-09-27 NOTE — H&P (Signed)
Cardiology Admission History and Physical:   Patient ID: Thaxton Pelley MRN: [id number]; DOB: 1940/03/31   Admission date: 09/26/2021  PCP:  Lujean Amel, MD   [facility] Providers Cardiologist:  Shirlee More, MD   { Click here to update MD or APP on Care Team, Refresh:1}     Chief Complaint:  ***  Patient Profile:   Dylan Zimmerman is a 81 y.o. male with *** who is being seen 09/27/2021 for the evaluation of ***.  History of Present Illness:   Mr. Nebel ***   Past Medical History:  Diagnosis Date   Back pain at L4-L5 level 12/07/2018   Cholelithiasis with chronic cholecystitis 04/20/2019   Chronic cholecystitis    Colorectal polyp detected on colonoscopy 01/01/2020   Elevated liver enzymes 04/20/2019   Essential hypertension 06/18/2018   Hyperlipidemia    Hyperlipidemia    Hypertension    Obesity 06/18/2018   Palpitations 06/18/2018   PVC's (premature ventricular contractions) 08/21/2018   Syncope and collapse     Past Surgical History:  Procedure Laterality Date   CATARACT EXTRACTION Bilateral    CHOLECYSTECTOMY N/A 04/21/2019   Procedure: LAPAROSCOPIC CHOLECYSTECTOMY WITH INTRAOPERATIVE CHOLANGIOGRAM;  Surgeon: Armandina Gemma, MD;  Location: [facility];  Service: General;  Laterality: N/A;   LITHOTRIPSY     LOOP RECORDER INSERTION N/A 10/04/2018   Procedure: LOOP RECORDER INSERTION;  Surgeon: Constance Haw, MD;  Location: [place] [facility];  Service: Cardiovascular;  Laterality: N/A;   TONSILLECTOMY       Medications Prior to Admission: Prior to Admission medications   Medication Sig Start Date End Date Taking? Authorizing Provider  amLODipine (NORVASC) 2.5 MG tablet Take 5 mg by mouth daily. 11/04/19   [provider]  Ascorbic Acid (VITAMIN C) 500 MG CAPS Take 1 Capful by mouth daily.    [provider]  aspirin EC 81 MG tablet Take 81 mg by mouth daily.    [provider]  atorvastatin (LIPITOR) 10 MG tablet Take 10  mg by mouth daily. 05/15/18   [provider]  cholecalciferol (VITAMIN D3) 25 MCG (1000 UNIT) tablet Take 1,000 Units by mouth daily.    [provider]  fluticasone (FLONASE) 50 MCG/ACT nasal spray Place 1 spray into both nostrils daily. 11/04/19   [provider]  Glucosamine-Chondroit-Vit C-Mn (GLUCOSAMINE 1500 COMPLEX PO) Take 1 tablet by mouth daily.    [provider]  hydrochlorothiazide (HYDRODIURIL) 25 MG tablet Take 1 tablet (25 mg total) by mouth daily. 07/12/21   Richardo Priest, MD  Multiple Vitamin (MULTIVITAMIN) tablet Take 1 tablet by mouth daily.    [provider]  Omega-3 Fatty Acids (FISH OIL) 1000 MG CAPS Take by mouth.    [provider]  sildenafil (REVATIO) 20 MG tablet Take 1 tablet by mouth as needed (ED). 06/07/18   [provider]  vitamin B-12 (CYANOCOBALAMIN) 500 MCG tablet Take 500 mcg by mouth daily.     [provider]  zolpidem (AMBIEN) 10 MG tablet Take 10 mg by mouth at bedtime as needed for sleep.  05/15/18   [provider]     Allergies:    Allergies  Allergen Reactions   Shellfish Allergy Anaphylaxis   Bactrim [Sulfamethoxazole-Trimethoprim] Nausea And Vomiting   Iodine    Other Other (See Comments)    Social History:   Social History   Socioeconomic History   Marital status: Married    Spouse name: Not on file  Number of children: Not on file   Years of education: Not on file   Highest education level: Not on file  Occupational History   Not on file  Tobacco Use   Smoking status: Never   Smokeless tobacco: Never  Vaping Use   Vaping Use: Never used  Substance and Sexual Activity   Alcohol use: Never   Drug use: Never   Sexual activity: Yes  Other Topics Concern   Not on file  Social History Narrative   Not on file   Social Determinants of Health   Financial Resource Strain: Not on file  Food Insecurity: Not on file  Transportation Needs: Not on file   Physical Activity: Not on file  Stress: Not on file  Social Connections: Not on file  Intimate Partner Violence: Not on file    Family History:  *** The patient's family history includes Breast cancer in his mother; Cancer in his father and mother; Heart attack in his maternal grandfather and paternal grandmother; Heart disease in his paternal grandfather and paternal grandmother.    ROS:  Please see the history of present illness.  ***All other ROS reviewed and negative.     Physical Exam/Data:   Vitals:   09/26/21 2030 09/26/21 2100 09/26/21 2130 09/26/21 2255  BP: (!) 152/72 (!) 146/63 (!) 151/68 (!) 158/74  Pulse: (!) 54 (!) 54 (!) 53 (!) 58  Resp: '18 15 14 18  '$ Temp:    98.5 F (36.9 C)  TempSrc:    Oral  SpO2: 97% 96% 96% 98%  Weight:    120.7 kg  Height:    '5\' 11"'$  (1.803 m)    Intake/Output Summary (Last 24 hours) at 09/27/2021 0002 Last data filed at 09/26/2021 2300 Gross per 24 hour  Intake 0 ml  Output --  Net 0 ml      09/26/2021   10:55 PM 09/26/2021    6:19 PM 09/26/2021   11:16 AM  Last 3 Weights  Weight (lbs) 266 lb 269 lb 12.8 oz 269 lb 12.8 oz  Weight (kg) 120.657 kg 122.38 kg 122.38 kg     Body mass index is 37.1 kg/m.  General:  Well nourished, well developed, in no acute distress*** HEENT: normal Neck: no*** JVD Vascular: No carotid bruits; Distal pulses 2+ bilaterally   Cardiac:  normal S1, S2; RRR; no murmur *** Lungs:  clear to auscultation bilaterally, no wheezing, rhonchi or rales  Abd: soft, nontender, no hepatomegaly  Ext: no*** edema Musculoskeletal:  No deformities, BUE and BLE strength normal and equal Skin: warm and dry  Neuro:  CNs 2-12 intact, no focal abnormalities noted Psych:  Normal affect    EKG:  The ECG that was done *** was personally reviewed and demonstrates ***  Relevant CV Studies: ***  Laboratory Data:  High Sensitivity Troponin:   Recent Labs  Lab 09/26/21 1833 09/26/21 2036  TROPONINIHS 10 11       Chemistry Recent Labs  Lab 09/26/21 1833  NA 141  K 4.1  CL 105  CO2 28  GLUCOSE 189*  BUN 20  CREATININE 1.12  CALCIUM 9.1  MG 2.1  GFRNONAA >60  ANIONGAP 8    No results for input(s): "PROT", "ALBUMIN", "AST", "ALT", "ALKPHOS", "BILITOT" in the last 168 hours. Lipids No results for input(s): "CHOL", "TRIG", "HDL", "LABVLDL", "LDLCALC", "CHOLHDL" in the last 168 hours. Hematology Recent Labs  Lab 09/26/21 1833  WBC 7.5  RBC 4.80  HGB 15.0  HCT 43.6  MCV 90.8  MCH 31.3  MCHC 34.4  RDW 13.4  PLT 199   Thyroid No results for input(s): "TSH", "FREET4" in the last 168 hours. BNPNo results for input(s): "BNP", "PROBNP" in the last 168 hours.  DDimer No results for input(s): "DDIMER" in the last 168 hours.   Radiology/Studies:  DG Chest 2 View  Result Date: 09/26/2021 CLINICAL DATA:  Intermittent pain between the shoulder blades EXAM: CHEST - 2 VIEW COMPARISON:  09/29/2008 FINDINGS: The heart size and mediastinal contours are within normal limits. Both lungs are clear. Degenerative changes of the spine. IMPRESSION: No active cardiopulmonary disease. Electronically Signed   By: Donavan Foil M.D.   On: 09/26/2021 19:23     Assessment and Plan:   ***   Risk Assessment/Risk Scores:  {Complete the following score calculators/questions to meet required metrics.  Press F2:1}  {Is the patient being seen for CHEST PAIN, UNSTABLE ANGINA, NSTEMI or STEMI?     :[id number]} {Does this patient have CHF or CHF symptoms?      :[id number]} {Does this patient have ATRIAL FIBRILLATION?[phone]}   Severity of Illness: {Observation/Inpatient:21159}   For questions or updates, please contact [place] Please consult [email or web address] for contact info under     Signed, Doyne Keel, MD  09/27/2021 12:02 AM

## 2021-09-27 NOTE — Plan of Care (Signed)
  Problem: Education: Goal: Knowledge of General Education information will improve Description: Including pain rating scale, medication(s)/side effects and non-pharmacologic comfort measures Outcome: Progressing   Problem: Health Behavior/Discharge Planning: Goal: Ability to manage health-related needs will improve Outcome: Progressing   Problem: Clinical Measurements: Goal: Respiratory complications will improve Outcome: Progressing Goal: Cardiovascular complication will be avoided Outcome: Progressing   Problem: Pain Managment: Goal: General experience of comfort will improve Outcome: Progressing   Problem: Safety: Goal: Ability to remain free from injury will improve Outcome: Progressing   Problem: Skin Integrity: Goal: Risk for impaired skin integrity will decrease Outcome: Progressing

## 2021-09-27 NOTE — Plan of Care (Signed)
  Problem: Clinical Measurements: Goal: Will remain free from infection Outcome: Progressing Goal: Respiratory complications will improve Outcome: Progressing Goal: Cardiovascular complication will be avoided Outcome: Progressing   Problem: Activity: Goal: Risk for activity intolerance will decrease Outcome: Progressing   Problem: Coping: Goal: Level of anxiety will decrease Outcome: Progressing   Problem: Elimination: Goal: Will not experience complications related to bowel motility Outcome: Progressing Goal: Will not experience complications related to urinary retention Outcome: Progressing

## 2021-09-27 NOTE — Progress Notes (Signed)
  Transition of Care [facility]) Screening Note   Patient Details  Name: Dylan Zimmerman Date of Birth: Jun 06, 1940   Transition of Care [facility]) CM/SW Contact:    Pollie Friar, RN Phone Number: 09/27/2021, 1:52 PM   From home with spouse. Transition of Care Department [facility]) has reviewed patient. We will continue to monitor patient advancement through interdisciplinary progression rounds. If new patient transition needs arise, please place a TOC consult.

## 2021-09-27 NOTE — Progress Notes (Addendum)
Progress Note  Patient Name: Dylan Zimmerman Date of Encounter: 09/27/2021  [facility] Cardiologist: Shirlee More, MD   Subjective   Feels okay this morning. No chest pain or dizziness.   Inpatient Medications    Scheduled Meds:  amLODipine  5 mg Oral Daily   aspirin EC  81 mg Oral Daily   atorvastatin  10 mg Oral Daily   cholecalciferol  1,000 Units Oral Daily   heparin  5,000 Units Subcutaneous Q8H   hydrochlorothiazide  25 mg Oral Daily   Continuous Infusions:  PRN Meds: acetaminophen, ondansetron (ZOFRAN) IV   Vital Signs    Vitals:   09/26/21 2130 09/26/21 2255 09/27/21 0400 09/27/21 0727  BP: (!) 151/68 (!) 158/74 (!) 129/56 (!) 144/58  Pulse: (!) 53 (!) 58 (!) 52 (!) 54  Resp: '14 18 16 15  '$ Temp:  98.5 F (36.9 C) 98.5 F (36.9 C) 97.9 F (36.6 C)  TempSrc:  Oral Oral Oral  SpO2: 96% 98% 97% 95%  Weight:  120.7 kg    Height:  '5\' 11"'$  (1.803 m)      Intake/Output Summary (Last 24 hours) at 09/27/2021 0852 Last data filed at 09/27/2021 0826 Gross per 24 hour  Intake 240 ml  Output 300 ml  Net -60 ml      09/26/2021   10:55 PM 09/26/2021    6:19 PM 09/26/2021   11:16 AM  Last 3 Weights  Weight (lbs) 266 lb 269 lb 12.8 oz 269 lb 12.8 oz  Weight (kg) 120.657 kg 122.38 kg 122.38 kg      Telemetry    NSR with first degree AVB; occasional PVCs - Personally Reviewed  ECG    NSR, 1st degree AVB, IVCD - Personally Reviewed  Physical Exam   GEN: No acute distress.   Neck: No JVD Cardiac: RRR, no murmurs, rubs, or gallops.  Respiratory: Clear to auscultation bilaterally. GI: Soft, nontender, non-distended  MS: Trace ankle edema, warm Neuro:  Nonfocal  Psych: Normal affect   Labs    High Sensitivity Troponin:   Recent Labs  Lab 09/26/21 1833 09/26/21 2036  TROPONINIHS 10 11     Chemistry Recent Labs  Lab 09/26/21 1833 09/27/21 0153  NA 141 140  K 4.1 3.7  CL 105 109  CO2 28 21*  GLUCOSE 189* 113*  BUN 20 20  CREATININE 1.12 1.02   CALCIUM 9.1 8.6*  MG 2.1  --   GFRNONAA >60 >60  ANIONGAP 8 10    Lipids  Recent Labs  Lab 09/27/21 0153  CHOL 135  TRIG 123  HDL 36*  LDLCALC 74  CHOLHDL 3.8    Hematology Recent Labs  Lab 09/26/21 1833 09/27/21 0153  WBC 7.5 7.5  RBC 4.80 4.40  HGB 15.0 13.9  HCT 43.6 39.9  MCV 90.8 90.7  MCH 31.3 31.6  MCHC 34.4 34.8  RDW 13.4 13.3  PLT 199 161   Thyroid  Recent Labs  Lab 09/27/21 0153  TSH 3.088  FREET4 1.02    BNP Recent Labs  Lab 09/27/21 0153  BNP 33.8    DDimer No results for input(s): "DDIMER" in the last 168 hours.   Radiology    DG Chest 2 View  Result Date: 09/26/2021 CLINICAL DATA:  Intermittent pain between the shoulder blades EXAM: CHEST - 2 VIEW COMPARISON:  09/29/2008 FINDINGS: The heart size and mediastinal contours are within normal limits. Both lungs are clear. Degenerative changes of the spine. IMPRESSION: No active cardiopulmonary disease.  Electronically Signed   By: Donavan Foil M.D.   On: 09/26/2021 19:23    Cardiac Studies   TTE 08/2018: IMPRESSIONS     1. The left ventricle has normal systolic function with an ejection  fraction of 60-65%. The cavity size was normal. There is mild concentric  left ventricular hypertrophy. Left ventricular diastolic Doppler  parameters are consistent with impaired  relaxation. There is mildly abnormal septal motion consistent with left  bundle branch block.   2. The right ventricle has normal systolic function. The cavity was  mildly enlarged. There is no increase in right ventricular wall thickness.   3. No evidence of mitral valve stenosis.   4. No stenosis of the aortic valve.   5. 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   Myoview 08/2018: Nuclear stress EF: 64%. There was no ST segment deviation noted during stress. Defect 1: There is a small defect of mild severity present in the basal inferoseptal and mid inferoseptal location. This is a low risk  study. The left ventricular ejection fraction is normal (55-65%). Mild fixed inferoseptal defect, likely artifact No evidence of ischemia.   Lexiscan nuclear stress test with normal EF and wall motion. Fixed small inferoseptal defect at rest, slightly improved with stress, may be artifact given size and location. Otherwise no perfusion abnormalities. Low risk study.  Patient Profile     81 y.o. male with history of HTN, HLD and PVCs who presented with near syncope found to have 6 seconds of complete heart block during the episode noted on preventice monitor that was placed 09/26/21 for dizziness prompting admission to [facility] for EP evaluation and likely PPM placement.  Assessment & Plan    #Complete Heart Block: #Near Syncope: Patient with history of syncope in the setting of working outside in humid weather for which he was evaluated by Dr. Curt Bears in the past. He underwent [employer] Jude loop placement on 09/2018, which showed no significant events over 2 years of monitoring. The patient requested to have the device removed in 10/2020. He was then seen by Dr. Geraldo Pitter on 09/26/21 with episodes of dizziness with slow HR to high 30-40s on apple watch at home. A 30day monitor was placed and the patient was found to have a symptomatic (near syncope) 6 second episode of complete heart block prompting referral to the ER. He is not currently on nodal agents. TTE pending. Has been HD stable since admission. Notably, the patient has been having progressive dyspnea on exertion over the past several months. Unclear if this is related to underlying conduction disease vs possible anginal equivalent. He states he believes his symptoms preceded his low HR and he was not experiencing dizziness until recently. Given these symptoms, will plan for cath for ischemic work-up and if unrevealing, plan for PPM evaluation at that time. -Plan for cath given concern for preceding anginal symptoms -If cath unrevealing or does not  explain underlying conduction disease, will consult EP for consideration for PPM placement -Follow-up TTE -Not on nodal agents -Continue to monitor on telemetry  #Concern for progressive angina: Patient reports several month history of progressive dyspnea on exertion. This preceded his episodes of dizziness, which raises concern for possible anginal symptoms. Given episodes of complete heart block as above, will assess further with cath at this time as detailed above. -Plan for cath tomorrow -Follow-up TTE -Continue ASA '81mg'$  daily -Continue lipitor '10mg'$  daily  #HTN: Stable. -Continue home amlodipine and HCTZ  #HLD: -Continue lipitor  $'10mg'z$  daily  For questions or updates, please contact [place] Please consult [email or web address] for contact info under        Signed, Freada Bergeron, MD  09/27/2021, 8:52 AM     INFORMED CONSENT: I have reviewed the risks, indications, and alternatives to cardiac catheterization, possible angioplasty, and stenting with the patient. Risks include but are not limited to bleeding, infection, vascular injury, stroke, myocardial infection, arrhythmia, kidney injury, radiation-related injury in the case of prolonged fluoroscopy use, emergency cardiac surgery, and death. The patient understands the risks of serious complication is 1-2 in [phone] with diagnostic cardiac cath and 1-2% or less with angioplasty/stenting.

## 2021-09-28 ENCOUNTER — Inpatient Hospital Stay (HOSPITAL_COMMUNITY): Payer: Medicare PPO

## 2021-09-28 ENCOUNTER — Encounter (HOSPITAL_COMMUNITY)
Admission: EM | Disposition: A | Discharge status: Home / Self Care | Payer: Self-pay | Source: Home / Self Care | Attending: Internal Medicine

## 2021-09-28 DIAGNOSIS — R55 Syncope and collapse: Secondary | ICD-10-CM

## 2021-09-28 DIAGNOSIS — R9431 Abnormal electrocardiogram [ECG] [EKG]: Secondary | ICD-10-CM | POA: Diagnosis not present

## 2021-09-28 DIAGNOSIS — I442 Atrioventricular block, complete: Secondary | ICD-10-CM | POA: Diagnosis not present

## 2021-09-28 DIAGNOSIS — I2510 Atherosclerotic heart disease of native coronary artery without angina pectoris: Secondary | ICD-10-CM

## 2021-09-28 HISTORY — PX: LEFT HEART CATH AND CORONARY ANGIOGRAPHY: CATH118249

## 2021-09-28 LAB — TSH: TSH: 2.996 u[IU]/mL (ref 0.350–4.500)

## 2021-09-28 LAB — ECHOCARDIOGRAM COMPLETE
Area-P 1/2: 3.75 cm2
Calc EF: 62.2 %
Height: 71 in
S' Lateral: 2.9 cm
Single Plane A2C EF: 64.9 %
Single Plane A4C EF: 59.2 %
Weight: 4188.8 oz

## 2021-09-28 SURGERY — LEFT HEART CATH AND CORONARY ANGIOGRAPHY
Anesthesia: LOCAL

## 2021-09-28 MED ORDER — SODIUM CHLORIDE 0.9% FLUSH
3.0000 mL | Freq: Two times a day (BID) | INTRAVENOUS | Status: DC
  Administered 2021-09-28 – 2021-09-30 (×3): 3 mL via INTRAVENOUS

## 2021-09-28 MED ORDER — FENTANYL CITRATE (PF) 100 MCG/2ML IJ SOLN
INTRAMUSCULAR | Status: AC
  Filled 2021-09-28: qty 2

## 2021-09-28 MED ORDER — MIDAZOLAM HCL 2 MG/2ML IJ SOLN
INTRAMUSCULAR | Status: AC
  Filled 2021-09-28: qty 2

## 2021-09-28 MED ORDER — SODIUM CHLORIDE 0.9% FLUSH: 3.0000 mL | INTRAVENOUS | Status: DC | PRN

## 2021-09-28 MED ORDER — SODIUM CHLORIDE 0.9 % IV SOLN: INTRAVENOUS | Status: AC

## 2021-09-28 MED ORDER — HYDRALAZINE HCL 20 MG/ML IJ SOLN: 10.0000 mg | INTRAMUSCULAR | Status: AC | PRN

## 2021-09-28 MED ORDER — LIDOCAINE HCL (PF) 1 % IJ SOLN
INTRAMUSCULAR | Status: DC | PRN
  Administered 2021-09-28: 2 mL

## 2021-09-28 MED ORDER — LIDOCAINE HCL (PF) 1 % IJ SOLN
INTRAMUSCULAR | Status: AC
  Filled 2021-09-28: qty 30

## 2021-09-28 MED ORDER — CEFAZOLIN SODIUM-DEXTROSE 2-4 GM/100ML-% IV SOLN
2.0000 g | INTRAVENOUS | Status: AC
  Administered 2021-09-29: 2 g via INTRAVENOUS

## 2021-09-28 MED ORDER — HEPARIN SODIUM (PORCINE) 1000 UNIT/ML IJ SOLN
INTRAMUSCULAR | Status: DC | PRN
  Administered 2021-09-28: 6000 [IU] via INTRAVENOUS

## 2021-09-28 MED ORDER — SODIUM CHLORIDE 0.9 % IV SOLN
80.0000 mg | INTRAVENOUS | Status: AC
  Administered 2021-09-29: 80 mg

## 2021-09-28 MED ORDER — SODIUM CHLORIDE 0.9% FLUSH
3.0000 mL | INTRAVENOUS | Status: DC | PRN
Start: 2021-09-28 — End: 2021-09-30

## 2021-09-28 MED ORDER — HEPARIN (PORCINE) IN NACL 1000-0.9 UT/500ML-% IV SOLN
INTRAVENOUS | Status: AC
  Filled 2021-09-28: qty 1000

## 2021-09-28 MED ORDER — SODIUM CHLORIDE 0.9 % IV SOLN: INTRAVENOUS | Status: DC

## 2021-09-28 MED ORDER — FENTANYL CITRATE (PF) 100 MCG/2ML IJ SOLN
INTRAMUSCULAR | Status: DC | PRN
  Administered 2021-09-28: 25 ug via INTRAVENOUS

## 2021-09-28 MED ORDER — LABETALOL HCL 5 MG/ML IV SOLN
10.0000 mg | INTRAVENOUS | Status: AC | PRN
Start: 2021-09-28 — End: 2021-09-29

## 2021-09-28 MED ORDER — HEPARIN SODIUM (PORCINE) 1000 UNIT/ML IJ SOLN
INTRAMUSCULAR | Status: AC
  Filled 2021-09-28: qty 10

## 2021-09-28 MED ORDER — MIDAZOLAM HCL 2 MG/2ML IJ SOLN
INTRAMUSCULAR | Status: DC | PRN
  Administered 2021-09-28: 1 mg via INTRAVENOUS

## 2021-09-28 MED ORDER — VERAPAMIL HCL 2.5 MG/ML IV SOLN
INTRAVENOUS | Status: AC
  Filled 2021-09-28: qty 2

## 2021-09-28 MED ORDER — HEPARIN SODIUM (PORCINE) 5000 UNIT/ML IJ SOLN
5000.0000 [IU] | Freq: Three times a day (TID) | INTRAMUSCULAR | Status: DC
  Administered 2021-09-28 – 2021-09-29 (×2): 5000 [IU] via SUBCUTANEOUS
  Filled 2021-09-28 (×2): qty 1

## 2021-09-28 MED ORDER — IOHEXOL 350 MG/ML SOLN
INTRAVENOUS | Status: DC | PRN
  Administered 2021-09-28: 110 mL

## 2021-09-28 MED ORDER — SODIUM CHLORIDE 0.9 % IV SOLN
250.0000 mL | INTRAVENOUS | Status: DC
  Administered 2021-09-28: 250 mL via INTRAVENOUS

## 2021-09-28 MED ORDER — ACETAMINOPHEN 325 MG PO TABS: 650.0000 mg | ORAL_TABLET | ORAL | Status: DC | PRN

## 2021-09-28 MED ORDER — HEPARIN (PORCINE) IN NACL 1000-0.9 UT/500ML-% IV SOLN
INTRAVENOUS | Status: DC | PRN
  Administered 2021-09-28 (×2): 500 mL

## 2021-09-28 MED ORDER — CHLORHEXIDINE GLUCONATE 4 % EX LIQD
60.0000 mL | Freq: Once | CUTANEOUS | Status: AC
  Administered 2021-09-28: 4 via TOPICAL
  Filled 2021-09-28: qty 60

## 2021-09-28 MED ORDER — SODIUM CHLORIDE 0.9 % IV SOLN
250.0000 mL | INTRAVENOUS | Status: DC | PRN
  Administered 2021-09-29: 250 mL via INTRAVENOUS

## 2021-09-28 MED ORDER — ONDANSETRON HCL 4 MG/2ML IJ SOLN: 4.0000 mg | Freq: Four times a day (QID) | INTRAMUSCULAR | Status: DC | PRN

## 2021-09-28 MED ORDER — SODIUM CHLORIDE 0.9% FLUSH
3.0000 mL | Freq: Two times a day (BID) | INTRAVENOUS | Status: DC
  Administered 2021-09-28 – 2021-09-30 (×4): 3 mL via INTRAVENOUS

## 2021-09-28 MED ORDER — VERAPAMIL HCL 2.5 MG/ML IV SOLN
INTRAVENOUS | Status: DC | PRN
  Administered 2021-09-28: 10 mL via INTRA_ARTERIAL

## 2021-09-28 SURGICAL SUPPLY — 13 items
BAND CMPR LRG ZPHR (HEMOSTASIS) ×1
BAND ZEPHYR COMPRESS 30 LONG (HEMOSTASIS) ×1 IMPLANT
CATH INFINITI 5FR JL4 (CATHETERS) ×1 IMPLANT
CATH OPTITORQUE TIG 4.0 5F (CATHETERS) ×1 IMPLANT
ELECT DEFIB PAD ADLT CADENCE (PAD) ×1 IMPLANT
GLIDESHEATH SLEND SS 6F .021 (SHEATH) ×1 IMPLANT
GUIDEWIRE INQWIRE 1.5J.035X260 (WIRE) IMPLANT
INQWIRE 1.5J .035X260CM (WIRE) ×2
KIT HEART LEFT (KITS) ×2 IMPLANT
PACK CARDIAC CATHETERIZATION (CUSTOM PROCEDURE TRAY) ×2 IMPLANT
SHEATH PROBE COVER 6X72 (BAG) ×1 IMPLANT
TRANSDUCER W/STOPCOCK (MISCELLANEOUS) ×2 IMPLANT
TUBING CIL FLEX 10 FLL-RA (TUBING) ×2 IMPLANT

## 2021-09-28 NOTE — H&P (View-Only) (Signed)
Progress Note  Patient Name: Dylan Zimmerman Date of Encounter: 09/28/2021  [facility] Cardiologist: Shirlee More, MD   Subjective   Asymptomatic overnight. No bradycardia on telemetry, but he does have frequent ectopic beats and brief bursts of nonsustained tachycardia. Has multiple rhythm strips recorded by his cardia mobile device showing heart rates around 40 bpm.  Some of them are concerning for atrial fibrillation with slow ventricular response, other tracings appear to be junctional rhythm.  Inpatient Medications    Scheduled Meds:  amLODipine  5 mg Oral Daily   aspirin EC  81 mg Oral Daily   atorvastatin  10 mg Oral Daily   cholecalciferol  1,000 Units Oral Daily   diphenhydrAMINE  50 mg Oral Once   Or   diphenhydrAMINE  50 mg Intravenous Once   heparin  5,000 Units Subcutaneous Q8H   hydrochlorothiazide  25 mg Oral Daily   predniSONE  50 mg Oral Q6H   sodium chloride flush  3 mL Intravenous Q12H   Continuous Infusions:  sodium chloride     sodium chloride 1 mL/kg/hr (09/28/21 0701)   PRN Meds: sodium chloride, acetaminophen, ondansetron (ZOFRAN) IV, sodium chloride flush   Vital Signs    Vitals:   09/27/21 1627 09/27/21 2009 09/28/21 0619 09/28/21 0631  BP: 135/66 (!) 150/71  138/79  Pulse: (!) 53 (!) 51  60  Resp: '15 20  20  '$ Temp: 97.6 F (36.4 C) 97.8 F (36.6 C)  97.7 F (36.5 C)  TempSrc: Oral Oral  Oral  SpO2: 94% 98%  97%  Weight:   118.8 kg   Height:   '5\' 11"'$  (1.803 m)     Intake/Output Summary (Last 24 hours) at 09/28/2021 0825 Last data filed at 09/28/2021 3664 Gross per 24 hour  Intake 1173 ml  Output 3000 ml  Net -1827 ml      09/28/2021    6:19 AM 09/26/2021   10:55 PM 09/26/2021    6:19 PM  Last 3 Weights  Weight (lbs) 261 lb 12.8 oz 266 lb 269 lb 12.8 oz  Weight (kg) 118.752 kg 120.657 kg 122.38 kg      Telemetry    Rhythm with frequent ectopy and brief bursts of nonsustained tachycardia with QRS complexes similar to baseline-  Personally Reviewed  ECG    Sinus rhythm, 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   Neck: No JVD Cardiac: RRR, no murmurs, rubs, or gallops.  Respiratory: Clear to auscultation bilaterally. GI: Soft, nontender, non-distended  MS: No edema; No deformity. Neuro:  Nonfocal  Psych: Normal affect   Labs    High Sensitivity Troponin:   Recent Labs  Lab 09/26/21 1833 09/26/21 2036  TROPONINIHS 10 11     Chemistry Recent Labs  Lab 09/26/21 1833 09/27/21 0153  NA 141 140  K 4.1 3.7  CL 105 109  CO2 28 21*  GLUCOSE 189* 113*  BUN 20 20  CREATININE 1.12 1.02  CALCIUM 9.1 8.6*  MG 2.1  --   GFRNONAA >60 >60  ANIONGAP 8 10    Lipids  Recent Labs  Lab 09/27/21 0153  CHOL 135  TRIG 123  HDL 36*  LDLCALC 74  CHOLHDL 3.8    Hematology Recent Labs  Lab 09/26/21 1833 09/27/21 0153  WBC 7.5 7.5  RBC 4.80 4.40  HGB 15.0 13.9  HCT 43.6 39.9  MCV 90.8  90.7  MCH 31.3 31.6  MCHC 34.4 34.8  RDW 13.4 13.3  PLT 199 161   Thyroid  Recent Labs  Lab 09/27/21 0153  TSH 3.088  FREET4 1.02    BNP Recent Labs  Lab 09/27/21 0153  BNP 33.8    DDimer No results for input(s): "DDIMER" in the last 168 hours.   Radiology    DG Chest 2 View  Result Date: 09/26/2021 CLINICAL DATA:  Intermittent pain between the shoulder blades EXAM: CHEST - 2 VIEW COMPARISON:  09/29/2008 FINDINGS: The heart size and mediastinal contours are within normal limits. Both lungs are clear. Degenerative changes of the spine. IMPRESSION: No active cardiopulmonary disease. Electronically Signed   By: Donavan Foil M.D.   On: 09/26/2021 19:23    Cardiac Studies   TTE 08/2018: IMPRESSIONS     1. The left ventricle has normal systolic function with an ejection  fraction of 60-65%. The cavity size was normal. There is mild concentric  left ventricular hypertrophy. Left  ventricular diastolic Doppler  parameters are consistent with impaired  relaxation. There is mildly abnormal septal motion consistent with left  bundle branch block.   2. The right ventricle has normal systolic function. The cavity was  mildly enlarged. There is no increase in right ventricular wall thickness.   3. No evidence of mitral valve stenosis.   4. No stenosis of the aortic valve.   5. 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    Myoview 08/2018: Nuclear stress EF: 64%. There was no ST segment deviation noted during stress. Defect 1: There is a small defect of mild severity present in the basal inferoseptal and mid inferoseptal location. This is a low risk study. The left ventricular ejection fraction is normal (55-65%). Mild fixed inferoseptal defect, likely artifact No evidence of ischemia.   Lexiscan nuclear stress test with normal EF and wall motion. Fixed small inferoseptal defect at rest, slightly improved with stress, may be artifact given size and location. Otherwise no perfusion abnormalities. Low risk study.  Patient Profile     81 y.o. 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 Avoid any negative chronotropic agents, will not treat the repeated episodes of ectopic atrial tachycardia. If severe coronary lesion is identified, but cannot be incriminated as a cause of his bradycardia, would prefer to defer revascularization until after he receives a pacemaker.  However, if a high-grade stenosis in the right coronary artery or left circumflex coronary artery supplying the AV node is identified (that could explain his complete heart block), this will  require revascularization today. If no culprit coronary lesion is found, he will need implantation of a dual-chamber permanent pacemaker. The purpose of the cardiac catheterization, possible percutaneous revascularization and possible pacemaker implantation was discussed in detail with the patient.  Also reviewed risks/benefits and possible complications of all of these procedures. The cardiac catheterization and possible PCI-stent procedure has been fully reviewed with the patient and written informed consent has been obtained.   For questions or updates, please contact [facility] Please consult [email or web address] for contact info under        Signed, Sanda Klein, MD  09/28/2021, 8:25 AM

## 2021-09-28 NOTE — Consult Note (Addendum)
Cardiology Consultation:   Patient ID: Dylan Zimmerman MRN: [id number]; DOB: [birth year]  Admit date: 09/26/2021 Date of Consult: 09/28/2021  PCP:  Lujean Amel, MD   [facility] Providers Cardiologist:  Shirlee More, MD   {   Patient Profile:   Dylan Zimmerman is a 81 y.o. male with a hx of HTN, HLD, obesity, PVCs who is being seen 09/28/2021 for the evaluation of CHB at the request of Dr. Sallyanne Kuster.  History of Present Illness:   Dylan Zimmerman was referred to EP for syncope of unclear etiology and had a loop put in July 2020, he last saw Dr. Curt Zimmerman in August without recurrent syncope or abnormal findings via the monitor, with  battery nearing depletion pt requested it be removed, and was explanted 10/25/20.  He saw Dr. Geraldo Pitter as a work-in visit 09/26/21 with reports of dizziness and a slow pulse being felt by family.  EKG the day of his visit was unrevealing and planned for a monitor  Monitor noted a 5.5 second pause associated with CHB and thge pt was advised to go to the ER, the patient reported he was up and about and did feel dizzy.  He was admitted and reported for a few months some degree of chest pressure with exertion as well, planned for cath, updated echo,  and EP consult.  LABS K+ 4.1 > 3.7 Mag 2.1 BUN/Creat 20/1.02 BNP 33.8 HS Trop 10, 11 WBC 7.5 H/H 13/39 Plts 161  Home meds reviewed He is on Timolol, though not felt to cause CHB Otherwise no nodal blocking agents  He had noted that the weekend prior to seeing Dr. Geraldo Pitter, he was feeling weak, sluggish with some dizziness, and his HR persistently 30's and 40's. He reports when they called about the monitor finding, the time of day did correspond to a dizzy spell. No fainting    Past Medical History:  Diagnosis Date   Back pain at L4-L5 level 12/07/2018   Cholelithiasis with chronic cholecystitis 04/20/2019   Chronic cholecystitis    Colorectal polyp detected on colonoscopy 01/01/2020   Elevated liver enzymes  04/20/2019   Essential hypertension 06/18/2018   Hyperlipidemia    Hyperlipidemia    Hypertension    Obesity 06/18/2018   Palpitations 06/18/2018   PVC's (premature ventricular contractions) 08/21/2018   Syncope and collapse     Past Surgical History:  Procedure Laterality Date   CATARACT EXTRACTION Bilateral    CHOLECYSTECTOMY N/A 04/21/2019   Procedure: LAPAROSCOPIC CHOLECYSTECTOMY WITH INTRAOPERATIVE CHOLANGIOGRAM;  Surgeon: Armandina Gemma, MD;  Location: [place];  Service: General;  Laterality: N/A;   LITHOTRIPSY     LOOP RECORDER INSERTION N/A 10/04/2018   Procedure: LOOP RECORDER INSERTION;  Surgeon: Constance Haw, MD;  Location: [place] [place] [facility];  Service: Cardiovascular;  Laterality: N/A;   TONSILLECTOMY       Home Medications:  Prior to Admission medications   Medication Sig Start Date End Date Taking? Authorizing Provider  amLODipine (NORVASC) 5 MG tablet Take 5 mg by mouth every morning.   Yes [provider]  Ascorbic Acid (VITAMIN C) 500 MG CAPS Take 500 mg by mouth every morning.   Yes [provider]  aspirin EC 81 MG tablet Take 81 mg by mouth every morning.   Yes [provider]  atorvastatin (LIPITOR) 10 MG tablet Take 10 mg by mouth every morning. 05/15/18  Yes [provider]  cholecalciferol (VITAMIN D3) 25 MCG (1000 UNIT) tablet Take 1,000 Units by mouth  every morning.   Yes [provider]  Cyanocobalamin (VITAMIN B-12 PO) Take 1 tablet by mouth every morning.   Yes [provider]  diclofenac Sodium (VOLTAREN) 1 % GEL Apply 1 Application topically at bedtime as needed (back pain).   Yes [provider]  dorzolamide-timolol (COSOPT) 22.3-6.8 MG/ML ophthalmic solution Place 1 drop into both eyes 2 (two) times daily.   Yes [provider]  fluticasone (FLONASE) 50 MCG/ACT nasal spray Place 1 spray into both nostrils at bedtime as needed for allergies or rhinitis. 11/04/19  Yes  [provider]  hydrochlorothiazide (HYDRODIURIL) 25 MG tablet Take 1 tablet (25 mg total) by mouth daily. Patient taking differently: Take 25 mg by mouth every morning. 07/12/21  Yes Richardo Priest, MD  ibuprofen (ADVIL) 200 MG tablet Take 400 mg by mouth 2 (two) times daily as needed (back pain).   Yes [provider]  latanoprost (XALATAN) 0.005 % ophthalmic solution Place 1 drop into both eyes at bedtime.   Yes [provider]  Multiple Vitamin (MULTIVITAMIN WITH MINERALS) TABS tablet Take 1 tablet by mouth every morning. Centrum Silver   Yes [provider]  Omega-3 Fatty Acids (FISH OIL) 1000 MG CAPS Take 1,000 mg by mouth every morning.   Yes [provider]  pseudoephedrine (SUDAFED) 30 MG tablet Take 30 mg by mouth 2 (two) times daily as needed (fluid in ears).   Yes [provider]  sildenafil (REVATIO) 20 MG tablet Take 20 mg by mouth daily as needed (ED). 06/07/18  Yes [provider]  zolpidem (AMBIEN) 10 MG tablet Take 10 mg by mouth at bedtime as needed for sleep.  05/15/18  Yes [provider]  amLODipine (NORVASC) 2.5 MG tablet Take 5 mg by mouth daily. Patient not taking: Reported on 09/27/2021 11/04/19   [provider]    Inpatient Medications: Scheduled Meds:  amLODipine  5 mg Oral Daily   aspirin EC  81 mg Oral Daily   atorvastatin  10 mg Oral Daily   cholecalciferol  1,000 Units Oral Daily   diphenhydrAMINE  50 mg Oral Once   Or   diphenhydrAMINE  50 mg Intravenous Once   heparin  5,000 Units Subcutaneous Q8H   hydrochlorothiazide  25 mg Oral Daily   predniSONE  50 mg Oral Q6H   sodium chloride flush  3 mL Intravenous Q12H   Continuous Infusions:  sodium chloride     sodium chloride 1 mL/kg/hr (09/28/21 0701)   PRN Meds: sodium chloride, acetaminophen, ondansetron (ZOFRAN) IV, sodium chloride flush  Allergies:    Allergies  Allergen Reactions   Shellfish Allergy Anaphylaxis    Bactrim [Sulfamethoxazole-Trimethoprim] Nausea And Vomiting   Iodine     Social History:   Social History   Socioeconomic History   Marital status: Married    Spouse name: Not on file   Number of children: Not on file   Years of education: Not on file   Highest education level: Not on file  Occupational History   Not on file  Tobacco Use   Smoking status: Never   Smokeless tobacco: Never  Vaping Use   Vaping Use: Never used  Substance and Sexual Activity   Alcohol use: Never   Drug use: Never   Sexual activity: Yes  Other Topics Concern   Not on file  Social History Narrative   Not on file   Social Determinants of Health   Financial Resource Strain: Not on file  Food  Insecurity: Not on file  Transportation Needs: Not on file  Physical Activity: Not on file  Stress: Not on file  Social Connections: Not on file  Intimate Partner Violence: Not on file    Family History:   Family History  Problem Relation Age of Onset   Cancer Mother    Breast cancer Mother    Cancer Father    Heart attack Maternal Grandfather    Heart disease Paternal Grandmother    Heart attack Paternal Grandmother    Heart disease Paternal Grandfather      ROS:  Please see the history of present illness.  All other ROS reviewed and negative.     Physical Exam/Data:   Vitals:   09/27/21 1627 09/27/21 2009 09/28/21 0619 09/28/21 0631  BP: 135/66 (!) 150/71  138/79  Pulse: (!) 53 (!) 51  60  Resp: '15 20  20  '$ Temp: 97.6 F (36.4 C) 97.8 F (36.6 C)  97.7 F (36.5 C)  TempSrc: Oral Oral  Oral  SpO2: 94% 98%  97%  Weight:   118.8 kg   Height:   '5\' 11"'$  (1.803 m)     Intake/Output Summary (Last 24 hours) at 09/28/2021 1046 Last data filed at 09/28/2021 0705 Gross per 24 hour  Intake 933 ml  Output 1950 ml  Net -1017 ml      09/28/2021    6:19 AM 09/26/2021   10:55 PM 09/26/2021    6:19 PM  Last 3 Weights  Weight (lbs) 261 lb 12.8 oz 266 lb 269 lb 12.8 oz  Weight (kg) 118.752 kg  120.657 kg 122.38 kg     Body mass index is 36.51 kg/m.  General:  Well nourished, well developed, in no acute distress HEENT: normal Neck: no JVD Vascular: No carotid bruits Cardiac:  RRR; no murmurs, gallops or rubs Lungs:  CTA b/l, no wheezing, rhonchi or rales  Abd: soft, nontender,obese  Ext: no edema Musculoskeletal:  No deformities Skin: warm and dry  Neuro: no gross focal motor abnormalities noted Psych:  Normal affect   EKG:  The EKG was personally reviewed and demonstrates:   SB 53bpm, 1st degree AVBlock 246m, LLAD, IVCD, QRS 1267m 09/26/21: SR 60bpm, LBBB 15055mLAD, 1st degree AVblock 212m28m2/22: SB 57bpm, LAD, QRS 118ms46mlemetry:  Telemetry was personally reviewed and demonstrates:   SB/SR 40's overnight, 50's-60's daytime, PACs  Relevant CV Studies:  Updated echo is pending Cath planned for later today  09/04/2018: TTE 1. The left ventricle has normal systolic function with an ejection  fraction of 60-65%. The cavity size was normal. There is mild concentric  left ventricular hypertrophy. Left ventricular diastolic Doppler  parameters are consistent with impaired  relaxation. There is mildly abnormal septal motion consistent with left  bundle branch block.   2. The right ventricle has normal systolic function. The cavity was  mildly enlarged. There is no increase in right ventricular wall thickness.   3. No evidence of mitral valve stenosis.   4. No stenosis of the aortic valve.   5. 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    08/27/2018: stress myoview Nuclear stress EF: 64%. There was no ST segment deviation noted during stress. Defect 1: There is a small defect of mild severity present in the basal inferoseptal and mid inferoseptal location. This is a low risk study. The left ventricular ejection fraction is normal (55-65%). Mild fixed inferoseptal defect, likely artifact No evidence of ischemia.  Lexiscan nuclear stress  test with normal EF and wall motion. Fixed small inferoseptal defect at rest, slightly improved with stress, may be artifact given size and location. Otherwise no perfusion abnormalities. Low risk study.  Laboratory Data:  High Sensitivity Troponin:   Recent Labs  Lab 09/26/21 1833 09/26/21 2036  TROPONINIHS 10 11     Chemistry Recent Labs  Lab 09/26/21 1833 09/27/21 0153  NA 141 140  K 4.1 3.7  CL 105 109  CO2 28 21*  GLUCOSE 189* 113*  BUN 20 20  CREATININE 1.12 1.02  CALCIUM 9.1 8.6*  MG 2.1  --   GFRNONAA >60 >60  ANIONGAP 8 10    No results for input(s): "PROT", "ALBUMIN", "AST", "ALT", "ALKPHOS", "BILITOT" in the last 168 hours. Lipids  Recent Labs  Lab 09/27/21 0153  CHOL 135  TRIG 123  HDL 36*  LDLCALC 74  CHOLHDL 3.8    Hematology Recent Labs  Lab 09/26/21 1833 09/27/21 0153  WBC 7.5 7.5  RBC 4.80 4.40  HGB 15.0 13.9  HCT 43.6 39.9  MCV 90.8 90.7  MCH 31.3 31.6  MCHC 34.4 34.8  RDW 13.4 13.3  PLT 199 161   Thyroid  Recent Labs  Lab 09/27/21 0153  TSH 3.088  FREET4 1.02    BNP Recent Labs  Lab 09/27/21 0153  BNP 33.8    DDimer No results for input(s): "DDIMER" in the last 168 hours.   Radiology/Studies:  DG Chest 2 View  Result Date: 09/26/2021 CLINICAL DATA:  Intermittent pain between the shoulder blades EXAM: CHEST - 2 VIEW COMPARISON:  09/29/2008 FINDINGS: The heart size and mediastinal contours are within normal limits. Both lungs are clear. Degenerative changes of the spine. IMPRESSION: No active cardiopulmonary disease. Electronically Signed   By: Donavan Foil M.D.   On: 09/26/2021 19:23     Assessment and Plan:   CHB Reports of dizziness, fatigue and HRs observed by the pt in the 30's and 40's of late as well Monitor noted an episode of CHB (though a lot of baseline artifact), with a V pause of 5.5 seconds He has had baseline conduction system disease with 1st degree Avblock, LAD and variable QRS duration, on the 3rd a  LBBB morphology.  He has had some CP as well in the last couple months and is planned for cath  He Dylan Zimmerman need PPM, assuming no obstructive CAD that would cause/explain this. Discussed with the patient and his wife rational, procedure, potential risks/benefits He is agreeable I have placed him on tomorrow's scheduled Suspect a LB pacing lead planned Echo is as well pending  Dr. Curt Zimmerman Maycen Degregory see later today   Risk Assessment/Risk Scores:     For questions or updates, please contact [facility] Please consult [email or web address] for contact info under    Signed, Baldwin Jamaica, PA-C  09/28/2021 10:46 AM  I have seen and examined this patient with Tommye Standard.  Agree with above, note added to reflect my findings.  He has a history significant for recurrent syncope.  He initially had a loop recorder implanted, but this was removed as he had no further episodes of syncope.  He was wearing a cardiac monitor and reported dizziness and slow pulse.  He also felt like he was near syncopal.  Monitor showed episodes of complete heart block and bradycardia.  He thus presented to the emergency room.  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  Patient Dylan Zimmerman need pacemaker implant.  He has plans for left heart catheterization today.  We Dylan Zimmerman plan for pacemaker implant tomorrow. Chest pain, shortness of breath: Plan for left heart catheterization today per primary cardiology.  Dylan Zimmerman M. Rosita Guzzetta MD 09/28/2021 1:16 PM

## 2021-09-28 NOTE — Progress Notes (Signed)
  Echocardiogram 2D Echocardiogram has been performed.  Dylan Zimmerman 09/28/2021, 9:22 AM

## 2021-09-28 NOTE — Progress Notes (Signed)
Progress Note  Patient Name: Dylan Zimmerman Date of Encounter: 09/28/2021  [facility] Cardiologist: Shirlee More, MD   Subjective   Asymptomatic overnight. No bradycardia on telemetry, but he does have frequent ectopic beats and brief bursts of nonsustained tachycardia. Has multiple rhythm strips recorded by his cardia mobile device showing heart rates around 40 bpm.  Some of them are concerning for atrial fibrillation with slow ventricular response, other tracings appear to be junctional rhythm.  Inpatient Medications    Scheduled Meds:  amLODipine  5 mg Oral Daily   aspirin EC  81 mg Oral Daily   atorvastatin  10 mg Oral Daily   cholecalciferol  1,000 Units Oral Daily   diphenhydrAMINE  50 mg Oral Once   Or   diphenhydrAMINE  50 mg Intravenous Once   heparin  5,000 Units Subcutaneous Q8H   hydrochlorothiazide  25 mg Oral Daily   predniSONE  50 mg Oral Q6H   sodium chloride flush  3 mL Intravenous Q12H   Continuous Infusions:  sodium chloride     sodium chloride 1 mL/kg/hr (09/28/21 0701)   PRN Meds: sodium chloride, acetaminophen, ondansetron (ZOFRAN) IV, sodium chloride flush   Vital Signs    Vitals:   09/27/21 1627 09/27/21 2009 09/28/21 0619 09/28/21 0631  BP: 135/66 (!) 150/71  138/79  Pulse: (!) 53 (!) 51  60  Resp: '15 20  20  '$ Temp: 97.6 F (36.4 C) 97.8 F (36.6 C)  97.7 F (36.5 C)  TempSrc: Oral Oral  Oral  SpO2: 94% 98%  97%  Weight:   118.8 kg   Height:   '5\' 11"'$  (1.803 m)     Intake/Output Summary (Last 24 hours) at 09/28/2021 0825 Last data filed at 09/28/2021 9622 Gross per 24 hour  Intake 1173 ml  Output 3000 ml  Net -1827 ml      09/28/2021    6:19 AM 09/26/2021   10:55 PM 09/26/2021    6:19 PM  Last 3 Weights  Weight (lbs) 261 lb 12.8 oz 266 lb 269 lb 12.8 oz  Weight (kg) 118.752 kg 120.657 kg 122.38 kg      Telemetry    Rhythm with frequent ectopy and brief bursts of nonsustained tachycardia with QRS complexes similar to baseline-  Personally Reviewed  ECG    Sinus rhythm, 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   Neck: No JVD Cardiac: RRR, no murmurs, rubs, or gallops.  Respiratory: Clear to auscultation bilaterally. GI: Soft, nontender, non-distended  MS: No edema; No deformity. Neuro:  Nonfocal  Psych: Normal affect   Labs    High Sensitivity Troponin:   Recent Labs  Lab 09/26/21 1833 09/26/21 2036  TROPONINIHS 10 11     Chemistry Recent Labs  Lab 09/26/21 1833 09/27/21 0153  NA 141 140  K 4.1 3.7  CL 105 109  CO2 28 21*  GLUCOSE 189* 113*  BUN 20 20  CREATININE 1.12 1.02  CALCIUM 9.1 8.6*  MG 2.1  --   GFRNONAA >60 >60  ANIONGAP 8 10    Lipids  Recent Labs  Lab 09/27/21 0153  CHOL 135  TRIG 123  HDL 36*  LDLCALC 74  CHOLHDL 3.8    Hematology Recent Labs  Lab 09/26/21 1833 09/27/21 0153  WBC 7.5 7.5  RBC 4.80 4.40  HGB 15.0 13.9  HCT 43.6 39.9  MCV 90.8  90.7  MCH 31.3 31.6  MCHC 34.4 34.8  RDW 13.4 13.3  PLT 199 161   Thyroid  Recent Labs  Lab 09/27/21 0153  TSH 3.088  FREET4 1.02    BNP Recent Labs  Lab 09/27/21 0153  BNP 33.8    DDimer No results for input(s): "DDIMER" in the last 168 hours.   Radiology    DG Chest 2 View  Result Date: 09/26/2021 CLINICAL DATA:  Intermittent pain between the shoulder blades EXAM: CHEST - 2 VIEW COMPARISON:  09/29/2008 FINDINGS: The heart size and mediastinal contours are within normal limits. Both lungs are clear. Degenerative changes of the spine. IMPRESSION: No active cardiopulmonary disease. Electronically Signed   By: Donavan Foil M.D.   On: 09/26/2021 19:23    Cardiac Studies   TTE 08/2018: IMPRESSIONS     1. The left ventricle has normal systolic function with an ejection  fraction of 60-65%. The cavity size was normal. There is mild concentric  left ventricular hypertrophy. Left  ventricular diastolic Doppler  parameters are consistent with impaired  relaxation. There is mildly abnormal septal motion consistent with left  bundle branch block.   2. The right ventricle has normal systolic function. The cavity was  mildly enlarged. There is no increase in right ventricular wall thickness.   3. No evidence of mitral valve stenosis.   4. No stenosis of the aortic valve.   5. 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    Myoview 08/2018: Nuclear stress EF: 64%. There was no ST segment deviation noted during stress. Defect 1: There is a small defect of mild severity present in the basal inferoseptal and mid inferoseptal location. This is a low risk study. The left ventricular ejection fraction is normal (55-65%). Mild fixed inferoseptal defect, likely artifact No evidence of ischemia.   Lexiscan nuclear stress test with normal EF and wall motion. Fixed small inferoseptal defect at rest, slightly improved with stress, may be artifact given size and location. Otherwise no perfusion abnormalities. Low risk study.  Patient Profile     81 y.o. 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 Avoid any negative chronotropic agents, will not treat the repeated episodes of ectopic atrial tachycardia. If severe coronary lesion is identified, but cannot be incriminated as a cause of his bradycardia, would prefer to defer revascularization until after he receives a pacemaker.  However, if a high-grade stenosis in the right coronary artery or left circumflex coronary artery supplying the AV node is identified (that could explain his complete heart block), this will  require revascularization today. If no culprit coronary lesion is found, he will need implantation of a dual-chamber permanent pacemaker. The purpose of the cardiac catheterization, possible percutaneous revascularization and possible pacemaker implantation was discussed in detail with the patient.  Also reviewed risks/benefits and possible complications of all of these procedures. The cardiac catheterization and possible PCI-stent procedure has been fully reviewed with the patient and written informed consent has been obtained.   For questions or updates, please contact [place] Please consult [email or web address] for contact info under        Signed, Sanda Klein, MD  09/28/2021, 8:25 AM

## 2021-09-28 NOTE — Interval H&P Note (Signed)
History and Physical Interval Note:  09/28/2021 4:07 PM  Dylan Zimmerman  has presented today for surgery, with the diagnosis of unstable angina.  The various methods of treatment have been discussed with the patient and family. After consideration of risks, benefits and other options for treatment, the patient has consented to  Procedure(s): LEFT HEART CATH AND CORONARY ANGIOGRAPHY (N/A)  PERCUTANEOUS CORONARY INTERVENTION  as a surgical intervention.  The patient's history has been reviewed, patient examined, no change in status, stable for surgery.  I have reviewed the patient's chart and labs.  Questions were answered to the patient's satisfaction.    Cath Lab Visit (complete for each Cath Lab visit)  Clinical Evaluation Leading to the Procedure:   ACS: No.  Non-ACS:    Anginal Classification: CCS II  Anti-ischemic medical therapy: Minimal Therapy (1 class of medications)  Non-Invasive Test Results: No non-invasive testing performed  Prior CABG: No previous CABG   Glenetta Hew

## 2021-09-29 ENCOUNTER — Encounter (HOSPITAL_COMMUNITY): Payer: Self-pay | Admitting: Cardiology

## 2021-09-29 ENCOUNTER — Encounter (HOSPITAL_COMMUNITY)
Admission: EM | Disposition: A | Discharge status: Home / Self Care | Payer: Self-pay | Source: Home / Self Care | Attending: Internal Medicine

## 2021-09-29 DIAGNOSIS — I442 Atrioventricular block, complete: Secondary | ICD-10-CM

## 2021-09-29 DIAGNOSIS — R001 Bradycardia, unspecified: Secondary | ICD-10-CM

## 2021-09-29 HISTORY — PX: PACEMAKER IMPLANT: EP1218

## 2021-09-29 LAB — SURGICAL PCR SCREEN
MRSA, PCR: NEGATIVE
Staphylococcus aureus: NEGATIVE

## 2021-09-29 SURGERY — PACEMAKER IMPLANT

## 2021-09-29 MED ORDER — METOPROLOL TARTRATE 25 MG PO TABS
25.0000 mg | ORAL_TABLET | ORAL | Status: AC
Start: 2021-09-29 — End: 2021-09-29
  Administered 2021-09-29: 25 mg via ORAL
  Filled 2021-09-29: qty 1

## 2021-09-29 MED ORDER — FENTANYL CITRATE (PF) 100 MCG/2ML IJ SOLN
INTRAMUSCULAR | Status: DC | PRN
  Administered 2021-09-29: 12.5 ug via INTRAVENOUS
  Administered 2021-09-29: 25 ug via INTRAVENOUS

## 2021-09-29 MED ORDER — ONDANSETRON HCL 4 MG/2ML IJ SOLN: 4.0000 mg | Freq: Four times a day (QID) | INTRAMUSCULAR | Status: DC | PRN

## 2021-09-29 MED ORDER — CEFAZOLIN SODIUM-DEXTROSE 1-4 GM/50ML-% IV SOLN
1.0000 g | Freq: Four times a day (QID) | INTRAVENOUS | Status: DC
  Administered 2021-09-29 – 2021-09-30 (×2): 1 g via INTRAVENOUS
  Filled 2021-09-29 (×4): qty 50

## 2021-09-29 MED ORDER — METOPROLOL TARTRATE 12.5 MG HALF TABLET
12.5000 mg | ORAL_TABLET | Freq: Four times a day (QID) | ORAL | Status: DC
  Administered 2021-09-30 (×2): 12.5 mg via ORAL
  Filled 2021-09-29 (×2): qty 1

## 2021-09-29 MED ORDER — MIDAZOLAM HCL 5 MG/5ML IJ SOLN
INTRAMUSCULAR | Status: AC
  Filled 2021-09-29: qty 5

## 2021-09-29 MED ORDER — DIPHENHYDRAMINE HCL 50 MG/ML IJ SOLN
25.0000 mg | Freq: Once | INTRAMUSCULAR | Status: DC
  Filled 2021-09-29: qty 1

## 2021-09-29 MED ORDER — SODIUM CHLORIDE 0.9 % IV SOLN
INTRAVENOUS | Status: AC
  Filled 2021-09-29: qty 2

## 2021-09-29 MED ORDER — METHYLPREDNISOLONE SODIUM SUCC 125 MG IJ SOLR
125.0000 mg | Freq: Once | INTRAMUSCULAR | Status: DC
  Filled 2021-09-29: qty 2

## 2021-09-29 MED ORDER — HEPARIN (PORCINE) IN NACL 1000-0.9 UT/500ML-% IV SOLN
INTRAVENOUS | Status: DC | PRN
  Administered 2021-09-29: 500 mL

## 2021-09-29 MED ORDER — CEFAZOLIN SODIUM-DEXTROSE 2-4 GM/100ML-% IV SOLN
INTRAVENOUS | Status: AC
  Filled 2021-09-29: qty 100

## 2021-09-29 MED ORDER — METOPROLOL TARTRATE 5 MG/5ML IV SOLN
2.5000 mg | INTRAVENOUS | Status: DC | PRN
  Administered 2021-09-29 (×2): 2.5 mg via INTRAVENOUS
  Filled 2021-09-29: qty 5

## 2021-09-29 MED ORDER — LIDOCAINE HCL (PF) 1 % IJ SOLN
INTRAMUSCULAR | Status: AC
Start: 2021-09-29 — End: ?
  Filled 2021-09-29: qty 30

## 2021-09-29 MED ORDER — LIDOCAINE HCL (PF) 1 % IJ SOLN
INTRAMUSCULAR | Status: DC | PRN
  Administered 2021-09-29: 60 mL

## 2021-09-29 MED ORDER — ACETAMINOPHEN 325 MG PO TABS: 325.0000 mg | ORAL_TABLET | ORAL | Status: DC | PRN

## 2021-09-29 MED ORDER — MIDAZOLAM HCL 5 MG/5ML IJ SOLN
INTRAMUSCULAR | Status: DC | PRN
  Administered 2021-09-29: 2 mg via INTRAVENOUS
  Administered 2021-09-29: 1 mg via INTRAVENOUS

## 2021-09-29 MED ORDER — HEPARIN (PORCINE) IN NACL 1000-0.9 UT/500ML-% IV SOLN
INTRAVENOUS | Status: AC
Start: 2021-09-29 — End: ?
  Filled 2021-09-29: qty 500

## 2021-09-29 MED ORDER — FENTANYL CITRATE (PF) 100 MCG/2ML IJ SOLN
INTRAMUSCULAR | Status: AC
  Filled 2021-09-29: qty 2

## 2021-09-29 SURGICAL SUPPLY — 13 items
CABLE SURGICAL S-101-97-12 (CABLE) ×2 IMPLANT
CATH CPS LOCATOR 3D MED (CATHETERS) ×1 IMPLANT
HELIX LOCKING TOOL (MISCELLANEOUS) ×2
LEAD TENDRIL MRI 52CM LPA1200M (Lead) ×1 IMPLANT
LEAD TENDRIL SDX 2088TC-58CM (Lead) ×1 IMPLANT
PACEMAKER ASSURITY DR-RF (Pacemaker) ×1 IMPLANT
PAD DEFIB RADIO PHYSIO CONN (PAD) ×2 IMPLANT
SHEATH 8FR PRELUDE SNAP 13 (SHEATH) ×1 IMPLANT
SHEATH 9FR PRELUDE SNAP 13 (SHEATH) ×1 IMPLANT
SLITTER AGILIS HISPRO (INSTRUMENTS) ×1 IMPLANT
TOOL HELIX LOCKING (MISCELLANEOUS) IMPLANT
TRAY PACEMAKER INSERTION (PACKS) ×2 IMPLANT
WIRE HI TORQ VERSACORE-J 145CM (WIRE) ×1 IMPLANT

## 2021-09-29 NOTE — Progress Notes (Signed)
CCMD notified Charge RN that patient went into A-FIB rhythm. Charge RN rounded on patient and patient is asymptomatic. HR sustaining 120s. Primary RN notified. Paged and spoke to Barrett. IV metoprolol prn for HR sustaining 120s q2h. EKG obtained.

## 2021-09-29 NOTE — Progress Notes (Addendum)
Patients heart rate has gotten to the 130's unsustained. RN paged NP Rosaria Ferries to advise.   Per EKG pt is in Afib/RVR

## 2021-09-29 NOTE — Progress Notes (Addendum)
Progress Note  Patient Name: Dylan Zimmerman Date of Encounter: 09/29/2021  [facility] Cardiologist: Shirlee More, MD   Subjective   Slept well, feels well  Inpatient Medications    Scheduled Meds:  amLODipine  5 mg Oral Daily   aspirin EC  81 mg Oral Daily   atorvastatin  10 mg Oral Daily   cholecalciferol  1,000 Units Oral Daily   diphenhydrAMINE  25 mg Intravenous Once   gentamicin (GARAMYCIN) 80 mg in sodium chloride 0.9 % 500 mL irrigation  80 mg Irrigation On Call   hydrochlorothiazide  25 mg Oral Daily   methylPREDNISolone sodium succinate  125 mg Intravenous Once   sodium chloride flush  3 mL Intravenous Q12H   sodium chloride flush  3 mL Intravenous Q12H   sodium chloride flush  3 mL Intravenous Q12H   sodium chloride flush  3 mL Intravenous Q12H   Continuous Infusions:  sodium chloride 50 mL/hr at 09/29/21 0611   sodium chloride 250 mL (09/28/21 2006)   sodium chloride      ceFAZolin (ANCEF) IV     PRN Meds: sodium chloride, acetaminophen, ondansetron (ZOFRAN) IV, ondansetron (ZOFRAN) IV, sodium chloride flush, sodium chloride flush   Vital Signs    Vitals:   09/28/21 1657 09/28/21 2200 09/29/21 0048 09/29/21 0735  BP: (!) 124/59 (!) 141/96 (!) 136/59 133/65  Pulse:  71 (!) 59   Resp:  '18 13 13  '$ Temp:  98 F (36.7 C) 97.8 F (36.6 C)   TempSrc:  Oral Oral   SpO2:  98% 94%   Weight:      Height:        Intake/Output Summary (Last 24 hours) at 09/29/2021 0950 Last data filed at 09/29/2021 0700 Gross per 24 hour  Intake 385.83 ml  Output 900 ml  Net -514.17 ml      09/28/2021    6:19 AM 09/26/2021   10:55 PM 09/26/2021    6:19 PM  Last 3 Weights  Weight (lbs) 261 lb 12.8 oz 266 lb 269 lb 12.8 oz  Weight (kg) 118.752 kg 120.657 kg 122.38 kg      Telemetry    SR/SB nmostly 50's, intermittent BBB morphology - Personally Reviewed  ECG    No new EKGs - Personally Reviewed  Physical Exam   GEN: No acute distress.   Neck: No JVD Cardiac: RRR,  no murmurs, rubs, or gallops.  Respiratory: Clear to auscultation bilaterally. GI: Soft, nontender, non-distended  MS: No edema; No deformity. Neuro:  Nonfocal  Psych: Normal affect   Labs    High Sensitivity Troponin:   Recent Labs  Lab 09/26/21 1833 09/26/21 2036  TROPONINIHS 10 11     Chemistry Recent Labs  Lab 09/26/21 1833 09/27/21 0153  NA 141 140  K 4.1 3.7  CL 105 109  CO2 28 21*  GLUCOSE 189* 113*  BUN 20 20  CREATININE 1.12 1.02  CALCIUM 9.1 8.6*  MG 2.1  --   GFRNONAA >60 >60  ANIONGAP 8 10    Lipids  Recent Labs  Lab 09/27/21 0153  CHOL 135  TRIG 123  HDL 36*  LDLCALC 74  CHOLHDL 3.8    Hematology Recent Labs  Lab 09/26/21 1833 09/27/21 0153  WBC 7.5 7.5  RBC 4.80 4.40  HGB 15.0 13.9  HCT 43.6 39.9  MCV 90.8 90.7  MCH 31.3 31.6  MCHC 34.4 34.8  RDW 13.4 13.3  PLT 199 161   Thyroid  Recent Labs  Lab 09/27/21 0153  TSH 3.088  FREET4 1.02    BNP Recent Labs  Lab 09/27/21 0153  BNP 33.8    DDimer No results for input(s): "DDIMER" in the last 168 hours.   Radiology     Cardiac Studies     09/28/21: TTE 1. Left ventricular ejection fraction by 3D volume is 59 %. The left  ventricle has normal function. The left ventricle has no regional wall  motion abnormalities. There is mild concentric left ventricular  hypertrophy. Left ventricular diastolic  parameters are consistent with Grade I diastolic dysfunction (impaired  relaxation).   2. Right ventricular systolic function is normal. The right ventricular  size is normal. Tricuspid regurgitation signal is inadequate for assessing  PA pressure.   3. The mitral valve is normal in structure. Trivial mitral valve  regurgitation. No evidence of mitral stenosis.   4. The aortic valve is tricuspid. Aortic valve regurgitation is not  visualized. Aortic valve sclerosis is present, with no evidence of aortic  valve stenosis.   5. There is mild dilatation of the ascending aorta,  measuring 38 mm.   6. The inferior vena cava is normal in size with greater than 50%  respiratory variability, suggesting right atrial pressure of 3 mmHg.   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   1st RPL lesion is 40% stenosed.   Prox RCA lesion is 20% stenosed.   The left ventricular systolic function is normal.   LV end diastolic pressure is normal.   There is no aortic valve stenosis.   SUMMARY Unusual coronary anatomy, but no significant lesions.  Nothing more than 40% proximal LAD and proximal PL 1. Large dominant RCA with a high bifurcation and very prominent PDA with 2 major prep PL branches. Large left main gives rise to a major circumflex-OM1 with small side branches.   There are 3 branches that come off-1 appears to be the LAD-ramus but there is a diagonal branch that comes off upstream of the circumflex on the left main.   The middle branch appears to be the LAD proper while the more medial branch appears to be essentially a septal perforator trunk with a diagonal.  Neither vessel reaches the apex.  These are all relatively small vessels. Normal LVEDP -> 9 mmHg   RECOMMENDATIONS: Recommend proceeding with pacemaker placement in the morning.  09/04/2018: TTE 1. The left ventricle has normal systolic function with an ejection  fraction of 60-65%. The cavity size was normal. There is mild concentric  left ventricular hypertrophy. Left ventricular diastolic Doppler  parameters are consistent with impaired  relaxation. There is mildly abnormal septal motion consistent with left  bundle branch block.   2. The right ventricle has normal systolic function. The cavity was  mildly enlarged. There is no increase in right ventricular wall thickness.   3. No evidence of mitral valve stenosis.   4. No stenosis of the aortic valve.   5. 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      08/27/2018: stress myoview Nuclear stress EF: 64%. There was no ST  segment deviation noted during stress. Defect 1: There is a small defect of mild severity present in the basal inferoseptal and mid inferoseptal location. This is a low risk study. The left ventricular ejection fraction is normal (55-65%). Mild fixed inferoseptal defect, likely artifact No evidence of ischemia.   Lexiscan nuclear stress test with normal EF and wall motion. Fixed small inferoseptal  defect at rest, slightly improved with stress, may be artifact given size and location. Otherwise no perfusion abnormalities. Low risk study.  Patient Profile     81 y.o. male with a hx of HTN, HLD, obesity, PVCs admitted with recurrent dizzy spell and CHB noted on monitor outpt.  Mr. Arquette was referred to EP for syncope of unclear etiology and had a loop put in July 2020, he last saw Dr. Curt Bears in August without recurrent syncope or abnormal findings via the monitor, with  battery nearing depletion pt requested it be removed, and was explanted 10/25/20.   He saw Dr. Geraldo Pitter as a work-in visit 09/26/21 with reports of dizziness and a slow pulse being felt by family.  EKG the day of his visit was unrevealing and planned for a monitor   Monitor noted a 5.5 second pause associated with CHB and the pt was advised to go to the ER, the patient reported he was up and about and did feel dizzy at the time of the pause.  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   No obstructive CAD by cath yesterday Preserved LVEF Planned for PPM today Follow up questions answered this morning, he remains agreeable to proceed.  Planned for PPM implant this afternoon   For questions or updates, please contact [place] Please consult [email or web address] for contact info under        Signed, Baldwin Jamaica, PA-C  09/29/2021, 9:50 AM    EP Attending  Patient seen and examined. He has symptomatic AV block and presents today for PPM insertion. He underwent left heart cath which demonstrated non-obstructive disease. He has had pauses of 5.5 seconds. I have reviewed the indications/risks/benefits/goals/expectations of DDD PM insertion and he wishes to proceed.  Carleene Overlie Lazaria Schaben,MD

## 2021-09-29 NOTE — Progress Notes (Signed)
Progress Note  Patient Name: Dylan Zimmerman Date of Encounter: 09/29/2021  [facility] Cardiologist: Shirlee More, MD   Subjective   Comfortable overnight.  No problems at right radial cath access site. Had a period of 2: 1 AV block with a ventricular rate of 40 bpm this morning around 0500 hrs.  Inpatient Medications    Scheduled Meds:  amLODipine  5 mg Oral Daily   aspirin EC  81 mg Oral Daily   atorvastatin  10 mg Oral Daily   cholecalciferol  1,000 Units Oral Daily   diphenhydrAMINE  25 mg Intravenous Once   gentamicin (GARAMYCIN) 80 mg in sodium chloride 0.9 % 500 mL irrigation  80 mg Irrigation On Call   hydrochlorothiazide  25 mg Oral Daily   methylPREDNISolone sodium succinate  125 mg Intravenous Once   sodium chloride flush  3 mL Intravenous Q12H   sodium chloride flush  3 mL Intravenous Q12H   sodium chloride flush  3 mL Intravenous Q12H   sodium chloride flush  3 mL Intravenous Q12H   Continuous Infusions:  sodium chloride 50 mL/hr at 09/29/21 0611   sodium chloride 250 mL (09/28/21 2006)   sodium chloride      ceFAZolin (ANCEF) IV     PRN Meds: sodium chloride, acetaminophen, ondansetron (ZOFRAN) IV, ondansetron (ZOFRAN) IV, sodium chloride flush, sodium chloride flush   Vital Signs    Vitals:   09/28/21 1657 09/28/21 2200 09/29/21 0048 09/29/21 0735  BP: (!) 124/59 (!) 141/96 (!) 136/59 133/65  Pulse:  71 (!) 59   Resp:  '18 13 13  '$ Temp:  98 F (36.7 C) 97.8 F (36.6 C)   TempSrc:  Oral Oral   SpO2:  98% 94%   Weight:      Height:        Intake/Output Summary (Last 24 hours) at 09/29/2021 0858 Last data filed at 09/29/2021 0700 Gross per 24 hour  Intake 385.83 ml  Output 900 ml  Net -514.17 ml      09/28/2021    6:19 AM 09/26/2021   10:55 PM 09/26/2021    6:19 PM  Last 3 Weights  Weight (lbs) 261 lb 12.8 oz 266 lb 269 lb 12.8 oz  Weight (kg) 118.752 kg 120.657 kg 122.38 kg      Telemetry    Mostly sinus rhythm; 1 pause due to blocked PAC,  occasional PVCs and 3 beat episodes of nonsustained VT, period of 2: 1 AV block around 0500 hrs. with ventricular rate of 40 bpm.- Personally Reviewed  ECG    No new tracings- Personally Reviewed  Physical Exam  Comfortable lying fully flat in bed.  No problems at right radial access site GEN: No acute distress.   Neck: No JVD Cardiac: RRR, no murmurs, rubs, or gallops.  Respiratory: Clear to auscultation bilaterally. GI: Soft, nontender, non-distended  MS: No edema; No deformity. Neuro:  Nonfocal  Psych: Normal affect   Labs    High Sensitivity Troponin:   Recent Labs  Lab 09/26/21 1833 09/26/21 2036  TROPONINIHS 10 11     Chemistry Recent Labs  Lab 09/26/21 1833 09/27/21 0153  NA 141 140  K 4.1 3.7  CL 105 109  CO2 28 21*  GLUCOSE 189* 113*  BUN 20 20  CREATININE 1.12 1.02  CALCIUM 9.1 8.6*  MG 2.1  --   GFRNONAA >60 >60  ANIONGAP 8 10    Lipids  Recent Labs  Lab 09/27/21 0153  CHOL 135  TRIG 123  HDL 36*  LDLCALC 74  CHOLHDL 3.8    Hematology Recent Labs  Lab 09/26/21 1833 09/27/21 0153  WBC 7.5 7.5  RBC 4.80 4.40  HGB 15.0 13.9  HCT 43.6 39.9  MCV 90.8 90.7  MCH 31.3 31.6  MCHC 34.4 34.8  RDW 13.4 13.3  PLT 199 161   Thyroid  Recent Labs  Lab 09/27/21 0153  TSH 3.088  FREET4 1.02    BNP Recent Labs  Lab 09/27/21 0153  BNP 33.8    DDimer No results for input(s): "DDIMER" in the last 168 hours.   Radiology    CARDIAC CATHETERIZATION  Result Date: 09/28/2021   Prox LAD lesion is 40% stenosed.   1st RPL lesion is 40% stenosed.   Prox RCA lesion is 20% stenosed.   The left ventricular systolic function is normal.   LV end diastolic pressure is normal.   There is no aortic valve stenosis. SUMMARY Unusual coronary anatomy, but no significant lesions.  Nothing more than 40% proximal LAD and proximal PL 1. Large dominant RCA with a high bifurcation and very prominent PDA with 2 major prep PL branches. Large left main gives rise to a  major circumflex-OM1 with small side branches.  There are 3 branches that come off-1 appears to be the LAD-ramus but there is a diagonal branch that comes off upstream of the circumflex on the left main.  The middle branch appears to be the LAD proper while the more medial branch appears to be essentially a septal perforator trunk with a diagonal.  Neither vessel reaches the apex.  These are all relatively small vessels. Normal LVEDP -> 9 mmHg RECOMMENDATIONS: Recommend proceeding with pacemaker placement in the morning. Glenetta Hew, MD  ECHOCARDIOGRAM COMPLETE  Result Date: 09/28/2021    ECHOCARDIOGRAM REPORT   Patient Name:   Dylan Zimmerman Date of Exam: 09/28/2021 Medical Rec #:  [id number]     Height:       71.0 in Accession #:    [id number]    Weight:       261.8 lb Date of Birth:  Jul 18, 1940     BSA:          2.364 m Patient Age:    81 years      BP:           138/79 mmHg Patient Gender: M             HR:           57 bpm. Exam Location:  Inpatient Procedure: 2D Echo, 3D Echo, Cardiac Doppler and Color Doppler Indications:    R94.31 Abnormal EKG. Complete heart block I44.2  History:        Patient has prior history of Echocardiogram examinations, most                 recent 09/04/2018. Abnormal ECG, Arrythmias:Heart block,                 Signs/Symptoms:Syncope; Risk Factors:Dyslipidemia and                 Hypertension.  Sonographer:    Roseanna Rainbow RDCS Referring Phys: Billey Chang JR  Sonographer Comments: Image acquisition challenging due to patient body habitus. IMPRESSIONS  1. Left ventricular ejection fraction by 3D volume is 59 %. The left ventricle has normal function. The left ventricle has no regional wall motion abnormalities. There is mild concentric left ventricular hypertrophy. Left ventricular diastolic parameters are consistent with  Grade I diastolic dysfunction (impaired relaxation).  2. Right ventricular systolic function is normal. The right ventricular size is normal. Tricuspid  regurgitation signal is inadequate for assessing PA pressure.  3. The mitral valve is normal in structure. Trivial mitral valve regurgitation. No evidence of mitral stenosis.  4. The aortic valve is tricuspid. Aortic valve regurgitation is not visualized. Aortic valve sclerosis is present, with no evidence of aortic valve stenosis.  5. There is mild dilatation of the ascending aorta, measuring 38 mm.  6. The inferior vena cava is normal in size with greater than 50% respiratory variability, suggesting right atrial pressure of 3 mmHg. FINDINGS  Left Ventricle: Left ventricular ejection fraction by 3D volume is 59 %. The left ventricle has normal function. The left ventricle has no regional wall motion abnormalities. The left ventricular internal cavity size was normal in size. There is mild concentric left ventricular hypertrophy. Left ventricular diastolic parameters are consistent with Grade I diastolic dysfunction (impaired relaxation). Right Ventricle: The right ventricular size is normal. No increase in right ventricular wall thickness. Right ventricular systolic function is normal. Tricuspid regurgitation signal is inadequate for assessing PA pressure. Left Atrium: Left atrial size was normal in size. Right Atrium: Right atrial size was normal in size. Pericardium: There is no evidence of pericardial effusion. Presence of epicardial fat layer. Mitral Valve: The mitral valve is normal in structure. Trivial mitral valve regurgitation. No evidence of mitral valve stenosis. Tricuspid Valve: The tricuspid valve is normal in structure. Tricuspid valve regurgitation is trivial. No evidence of tricuspid stenosis. Aortic Valve: The aortic valve is tricuspid. Aortic valve regurgitation is not visualized. Aortic valve sclerosis is present, with no evidence of aortic valve stenosis. Pulmonic Valve: The pulmonic valve was normal in structure. Pulmonic valve regurgitation is not visualized. No evidence of pulmonic stenosis.  Aorta: The aortic root is normal in size and structure. There is mild dilatation of the ascending aorta, measuring 38 mm. Venous: The inferior vena cava is normal in size with greater than 50% respiratory variability, suggesting right atrial pressure of 3 mmHg. IAS/Shunts: No atrial level shunt detected by color flow Doppler.  LEFT VENTRICLE PLAX 2D LVIDd:         4.50 cm         Diastology LVIDs:         2.90 cm         LV e' medial:    6.96 cm/s LV PW:         1.30 cm         LV E/e' medial:  9.1 LV IVS:        1.20 cm         LV e' lateral:   5.87 cm/s LVOT diam:     2.50 cm         LV E/e' lateral: 10.8 LV SV:         132 LV SV Index:   56 LVOT Area:     4.91 cm        3D Volume EF                                LV 3D EF:    Left  ventricul LV Volumes (MOD)                            ar LV vol d, MOD    106.0 ml                   ejection A2C:                                        fraction LV vol d, MOD    89.5 ml                    by 3D A4C:                                        volume is LV vol s, MOD    37.2 ml                    59 %. A2C: LV vol s, MOD    36.6 ml A4C:                           3D Volume EF: LV SV MOD A2C:   68.8 ml       3D EF:        59 % LV SV MOD A4C:   89.5 ml       LV EDV:       197 ml LV SV MOD BP:    62.4 ml       LV ESV:       80 ml                                LV SV:        117 ml RIGHT VENTRICLE             IVC RV S prime:     15.30 cm/s  IVC diam: 2.30 cm TAPSE (M-mode): 2.3 cm LEFT ATRIUM           Index        RIGHT ATRIUM           Index LA diam:      3.10 cm 1.31 cm/m   RA Area:     21.00 cm LA Vol (A2C): 46.6 ml 19.71 ml/m  RA Volume:   60.10 ml  25.42 ml/m LA Vol (A4C): 31.5 ml 13.32 ml/m  AORTIC VALVE LVOT Vmax:   128.00 cm/s LVOT Vmean:  84.100 cm/s LVOT VTI:    0.268 m  AORTA Ao Root diam: 3.50 cm Ao Asc diam:  3.80 cm MITRAL VALVE MV Area (PHT): 3.75 cm     SHUNTS MV Decel Time: 203 msec     Systemic VTI:  0.27 m MV  E velocity: 63.15 cm/s   Systemic Diam: 2.50 cm MV A velocity: 113.00 cm/s MV E/A ratio:  0.56 Kardie Tobb DO Electronically signed by Berniece Salines DO Signature Date/Time: 09/28/2021/12:31:52 PM    Final     Cardiac Studies   As above  Patient Profile     81 y.o. male with near syncope and symptomatic bradycardia and evidence of intermittent complete  heart block, without reversible underlying cause.  Assessment & Plan    Scheduled for dual-chamber permanent pacemaker at 1330 hrs. with Dr. Curt Bears today. This procedure has been fully reviewed with the patient and written informed consent has been obtained.   For questions or updates, please contact [place] Please consult [email or web address] for contact info under        Signed, Sanda Klein, MD  09/29/2021, 8:58 AM

## 2021-09-29 NOTE — Progress Notes (Signed)
   Called by RN re: pt in rapid atrial fib, new dx.  Pt just had PPM today.   HR up in the 120s at times.  With PPM just implanted, will not start anticoag at this time.  With hx bradycardia req PPM, will not add longer-acting BB at this time.  Will add IV metop prn for elevated HR.  Otherwise, cont to monitor overnight, EP will see in am.   Get ECG.  Rosaria Ferries, PA-C 09/29/2021 7:03 PM

## 2021-09-29 NOTE — Progress Notes (Signed)
RN spoke to NP, Barrett. NP stated she will have the fellow come to look at the patient.

## 2021-09-30 ENCOUNTER — Encounter (HOSPITAL_COMMUNITY): Payer: Self-pay | Admitting: Internal Medicine

## 2021-09-30 ENCOUNTER — Inpatient Hospital Stay (HOSPITAL_COMMUNITY): Payer: Medicare PPO

## 2021-09-30 ENCOUNTER — Other Ambulatory Visit (HOSPITAL_COMMUNITY): Payer: Self-pay

## 2021-09-30 DIAGNOSIS — I442 Atrioventricular block, complete: Secondary | ICD-10-CM | POA: Diagnosis not present

## 2021-09-30 LAB — LIPOPROTEIN A (LPA): Lipoprotein (a): 10.2 nmol/L (ref <75.0)

## 2021-09-30 MED ORDER — METOPROLOL SUCCINATE ER 50 MG PO TB24: 50.0000 mg | ORAL_TABLET | Freq: Every day | ORAL | Status: DC

## 2021-09-30 MED ORDER — APIXABAN 5 MG PO TABS
5.0000 mg | ORAL_TABLET | Freq: Two times a day (BID) | ORAL | 5 refills | Status: DC
  Filled 2021-09-30: qty 60, 30d supply, fill #0

## 2021-09-30 MED ORDER — METOPROLOL SUCCINATE ER 50 MG PO TB24
50.0000 mg | ORAL_TABLET | Freq: Every day | ORAL | Status: DC
Start: 2021-10-01 — End: 2021-09-30

## 2021-09-30 MED ORDER — METOPROLOL SUCCINATE ER 50 MG PO TB24
50.0000 mg | ORAL_TABLET | Freq: Every day | ORAL | 5 refills | Status: DC
  Filled 2021-09-30: qty 30, 30d supply, fill #0

## 2021-09-30 NOTE — Progress Notes (Signed)
While giving shift report, patient showed AFIB with RVR. EKG was done and on call for Dr. Sallyanne Kuster, Rosaria Ferries was notified.  At this time the patient's wife reported that a physician reported to her post pacemaker placement that her husband was in Afib prior to the procedure. RN had not received any report of this.

## 2021-09-30 NOTE — Discharge Summary (Cosign Needed)
ELECTROPHYSIOLOGY PROCEDURE DISCHARGE SUMMARY    Patient ID: Dylan Zimmerman,  MRN: [id number], DOB/AGE: 06-07-1940 81 y.o.  Admit date: 09/26/2021 Discharge date: 09/30/2021  Primary Care Physician: Lujean Amel, MD Primary Cardiologist: Dr. Bettina Gavia Electrophysiologist: Dr. Curt Bears  Primary Discharge Diagnosis:  CHB  Secondary Discharge Diagnosis:  HTN PVCs obesity  Allergies  Allergen Reactions   Shellfish Allergy Anaphylaxis   Bactrim [Sulfamethoxazole-Trimethoprim] Nausea And Vomiting   Iodine      Procedures This Admission:   09/28/21: LHC, Dr. Ellyn Hack 2.  Implantation of a Abbott dual chamber PPM on 09/29/21 by Dr Lovena Le.     Brief HPI: Dylan Zimmerman is a 81 y.o. male was wearing a heart monitor for recent c/o dizziness, and observed slow HRs at home,  the monitor noted episode of CHB with prolonged V pause and he was referred to the ER.  Hospital Course:  The patient did report that he felt dizzy at the time of the pause.  He had also been having some CP of late and planned for LHC and pacing. He was admitted, labs unrevealing. LHC noted no obstructive CAD 09/28/21 and he and underwent implantation of a PPM 09/29/21, see procedure reports for full details of both. TTE noted preserved LVEF.  He was monitored on telemetry throughout his stay.  Yesterday on arrival to the lab for pacing he was in AFib, he had spontaneous conversion over night/early this AM.  Left chest was without hematoma or ecchymosis.  The device was interrogated and found to be functioning normally.  CXR was obtained and demonstrated no pneumothorax status post device implantation.  Wound care, arm mobility, and restrictions were reviewed with the patient.  R wrist site remains stable post cath as well.  The patient feels well, denies any CP/SOB, with minimal site discomfort.  He was examined by Dr. Lovena Le and considered stable for discharge to home.   Start Toprol XL '50mg'$  daily Start Eliquis '5mg'$  BID Wed  10/05/21   Physical Exam: Vitals:   09/29/21 1900 09/29/21 2040 09/30/21 0634 09/30/21 0901  BP: (!) 148/84 129/72 119/65 124/62  Pulse:  82 60 (!) 59  Resp: '16 16 16   '$ Temp: 97.6 F (36.4 C) (!) 97.5 F (36.4 C) (!) 97.5 F (36.4 C)   TempSrc:  Oral Oral   SpO2:  94% 98%   Weight:   122.3 kg   Height:        GEN- The patient is well appearing, alert and oriented x 3 today.   HEENT: normocephalic, atraumatic; sclera clear, conjunctiva pink; hearing intact; oropharynx clear; neck supple, no JVP Lungs- CTA b/l, normal work of breathing.  No wheezes, rales, rhonchi Heart- RRR, no murmurs, rubs or gallops, PMI not laterally displaced GI- soft, non-tender, non-distended Extremities- no clubbing, cyanosis, or edema R wrist is stable, no bleeding, hematoma, pain MS- no significant deformity or atrophy Skin- warm and dry, no rash or lesion, left chest without hematoma/ecchymosis Psych- euthymic mood, full affect Neuro- no gross deficits   Labs:   Lab Results  Component Value Date   WBC 7.5 09/27/2021   HGB 13.9 09/27/2021   HCT 39.9 09/27/2021   MCV 90.7 09/27/2021   PLT 161 09/27/2021    Recent Labs  Lab 09/27/21 0153  NA 140  K 3.7  CL 109  CO2 21*  BUN 20  CREATININE 1.02  CALCIUM 8.6*  GLUCOSE 113*    Discharge Medications:  Allergies as of 09/30/2021  Reactions   Shellfish Allergy Anaphylaxis   Bactrim [sulfamethoxazole-trimethoprim] Nausea And Vomiting   Iodine         Medication List     STOP taking these medications    Advil 200 MG tablet Generic drug: ibuprofen   aspirin EC 81 MG tablet       TAKE these medications    amLODipine 5 MG tablet Commonly known as: NORVASC Take 5 mg by mouth every morning. What changed: Another medication with the same name was removed. Continue taking this medication, and follow the directions you see here.   apixaban 5 MG Tabs tablet Commonly known as: ELIQUIS Take 1 tablet (5 mg total) by mouth 2  (two) times daily.   atorvastatin 10 MG tablet Commonly known as: LIPITOR Take 10 mg by mouth every morning.   cholecalciferol 25 MCG (1000 UNIT) tablet Commonly known as: VITAMIN D3 Take 1,000 Units by mouth every morning.   dorzolamide-timolol 22.3-6.8 MG/ML ophthalmic solution Commonly known as: COSOPT Place 1 drop into both eyes 2 (two) times daily.   Fish Oil 1000 MG Caps Take 1,000 mg by mouth every morning.   fluticasone 50 MCG/ACT nasal spray Commonly known as: FLONASE Place 1 spray into both nostrils at bedtime as needed for allergies or rhinitis.   hydrochlorothiazide 25 MG tablet Commonly known as: HYDRODIURIL Take 1 tablet (25 mg total) by mouth daily. What changed: when to take this   latanoprost 0.005 % ophthalmic solution Commonly known as: XALATAN Place 1 drop into both eyes at bedtime.   metoprolol succinate 50 MG 24 hr tablet Commonly known as: TOPROL-XL Take 1 tablet (50 mg total) by mouth daily. Take with or immediately following a meal. Start taking on: October 01, 2021   multivitamin with minerals Tabs tablet Take 1 tablet by mouth every morning. Centrum Silver   pseudoephedrine 30 MG tablet Commonly known as: SUDAFED Take 30 mg by mouth 2 (two) times daily as needed (fluid in ears).   sildenafil 20 MG tablet Commonly known as: REVATIO Take 20 mg by mouth daily as needed (ED).   VITAMIN B-12 PO Take 1 tablet by mouth every morning.   Vitamin C 500 MG Caps Take 500 mg by mouth every morning.   Voltaren 1 % Gel Generic drug: diclofenac Sodium Apply 1 Application topically at bedtime as needed (back pain).   zolpidem 10 MG tablet Commonly known as: AMBIEN Take 10 mg by mouth at bedtime as needed for sleep.        Disposition: Home Discharge Instructions     Diet - low sodium heart healthy   Complete by: As directed    Increase activity slowly   Complete by: As directed         Duration of Discharge Encounter: Greater than 30  minutes including physician time.  Venetia Night, PA-C 09/30/2021 10:03 AM  EP Attending  Patient seen and examined. Agree with the findings as noted above. The patient is s/p PPM insertion. Interrogation of his new DDD PM under my supervision demonstrates normal DDD PM function. His cxr demonstrates no PTX and leads are in stable position. He will be discharged home with the usual followup.  Carleene Overlie Faren Florence,MD

## 2021-09-30 NOTE — Progress Notes (Signed)
Pt completed placement of permanent Pacemaker by [employer]. Left arm is in a sling with instructions to patient not to move left arm or raise left arm for 24 hours starting at 430 pm today. Pt received 2 g Ancef, versed and 37.5 of fentanyl.  The pacemake is a [employer] pacemaker with settings at DDD 60-120. Pt is on room air with 95% O2 saturation, heart rate in the 60s, and BP 126/77.

## 2021-09-30 NOTE — Plan of Care (Signed)

## 2021-09-30 NOTE — Care Management Important Message (Signed)
Important Message  Patient Details  Name: Dylan Zimmerman MRN: [id number] Date of Birth: [birth year]   Medicare Important Message Given:  Yes     Shelda Altes 09/30/2021, 9:55 AM

## 2021-09-30 NOTE — Progress Notes (Signed)
Explained discharge instructions to patient and his wife. Reviewed follow up appointments and next medication administration times. Also provided written and verbal post procedure pacemaker care instructions. Both patient and wife verbalized understanding. PIV and tele was removed by staff NT. All belongings are in the patient's possession. TOC meds have been delivered to the patient.

## 2021-09-30 NOTE — TOC Transition Note (Signed)
Transition of Care [facility]) - CM/SW Discharge Note   Patient Details  Name: Dylan Zimmerman MRN: [id number] Date of Birth: 1940-11-08  Transition of Care [facility] CM/SW Contact:  Zenon Mayo, RN Phone Number: 09/30/2021, 10:15 AM   Clinical Narrative:    Patient is for dc today , s/p pacemaker placement, will start on eliquis. TOC to fill meds.  She has transport home at dc.          Patient Goals and CMS Choice        Discharge Placement                       Discharge Plan and Services                                     Social Determinants of Health (SDOH) Interventions     Readmission Risk Interventions     No data to display

## 2021-09-30 NOTE — Discharge Instructions (Addendum)
   Post procedure care instructions (R wrist) No lifting over 5 lbs for 1 week. No vigorous or sexual activity for 1 week.   Keep procedure site clean & dry. If you notice increased pain, swelling, bleeding or pus, call/return!  You may shower after 24 hours, but no soaking in baths/hot tubs/pools for 1 week.     Supplemental Discharge Instructions for  Pacemaker Patients   Activity No heavy lifting or vigorous activity with your left/right arm for 6 to 8 weeks.  Do not raise your left/right arm above your head for one week.  Gradually raise your affected arm as drawn below.              10/04/21                     10/05/21                    10/06/21                 10/07/21 __  NO DRIVING for   1 week  ; you may begin driving on   0/98/11  .  WOUND CARE Keep the wound area clean and dry.  Do not get this area wet , no showers for one week; you may shower on  10/07/21   . The tape/steri-strips on your wound will fall off; do not pull them off.  No bandage is needed on the site.  DO  NOT apply any creams, oils, or ointments to the wound area. If you notice any drainage or discharge from the wound, any swelling or bruising at the site, or you develop a fever > 101? F after you are discharged home, call the office at once.  Special Instructions You are still able to use cellular telephones; use the ear opposite the side where you have your pacemaker/defibrillator.  Avoid carrying your cellular phone near your device. When traveling through airports, show security personnel your identification card to avoid being screened in the metal detectors.  Ask the security personnel to use the hand wand. Avoid arc welding equipment, MRI testing (magnetic resonance imaging), TENS units (transcutaneous nerve stimulators).  Call the office for questions about other devices. Avoid electrical appliances that are in poor condition or are not properly grounded. Microwave ovens are safe to be near or to  operate.

## 2021-10-03 ENCOUNTER — Telehealth: Payer: Self-pay | Admitting: *Deleted

## 2021-10-03 NOTE — Telephone Encounter (Signed)
Reached out to pt to see how he is doing after Pacemaker put in. Pt states he is doing fine and is no longer wearing preventice monitor. Pt states will send monitor back today and he has already spoken with the company. Pt will see PCP this week and then get scheduled for follow up with Dr. Bettina Gavia.

## 2021-10-05 DIAGNOSIS — I442 Atrioventricular block, complete: Secondary | ICD-10-CM | POA: Diagnosis not present

## 2021-10-05 DIAGNOSIS — Z79899 Other long term (current) drug therapy: Secondary | ICD-10-CM | POA: Diagnosis not present

## 2021-10-05 DIAGNOSIS — Z950 Presence of cardiac pacemaker: Secondary | ICD-10-CM | POA: Diagnosis not present

## 2021-10-05 DIAGNOSIS — M545 Low back pain, unspecified: Secondary | ICD-10-CM | POA: Diagnosis not present

## 2021-10-05 DIAGNOSIS — I480 Paroxysmal atrial fibrillation: Secondary | ICD-10-CM | POA: Diagnosis not present

## 2021-10-10 ENCOUNTER — Other Ambulatory Visit (HOSPITAL_COMMUNITY): Payer: Self-pay

## 2021-10-12 ENCOUNTER — Ambulatory Visit (INDEPENDENT_AMBULATORY_CARE_PROVIDER_SITE_OTHER): Payer: Medicare PPO

## 2021-10-12 ENCOUNTER — Telehealth (HOSPITAL_COMMUNITY): Payer: Self-pay

## 2021-10-12 ENCOUNTER — Other Ambulatory Visit (HOSPITAL_COMMUNITY): Payer: Self-pay

## 2021-10-12 DIAGNOSIS — I442 Atrioventricular block, complete: Secondary | ICD-10-CM | POA: Diagnosis not present

## 2021-10-12 LAB — CUP PACEART INCLINIC DEVICE CHECK
Battery Remaining Longevity: 87 mo
Battery Voltage: 3.04 V
Brady Statistic RA Percent Paced: 90 %
Brady Statistic RV Percent Paced: 0.03 %
Date Time Interrogation Session: 20230719144000
Implantable Lead Implant Date: 20230706
Implantable Lead Implant Date: 20230706
Implantable Lead Location: 753859
Implantable Lead Location: 753860
Implantable Pulse Generator Implant Date: 20230706
Lead Channel Impedance Value: 425 Ohm
Lead Channel Impedance Value: 462.5 Ohm
Lead Channel Pacing Threshold Amplitude: 0.75 V
Lead Channel Pacing Threshold Amplitude: 0.75 V
Lead Channel Pacing Threshold Amplitude: 0.75 V
Lead Channel Pacing Threshold Amplitude: 0.75 V
Lead Channel Pacing Threshold Pulse Width: 0.4 ms
Lead Channel Pacing Threshold Pulse Width: 0.4 ms
Lead Channel Pacing Threshold Pulse Width: 0.5 ms
Lead Channel Pacing Threshold Pulse Width: 0.5 ms
Lead Channel Sensing Intrinsic Amplitude: 2.9 mV
Lead Channel Sensing Intrinsic Amplitude: 7.8 mV
Lead Channel Setting Pacing Amplitude: 3.5 V
Lead Channel Setting Pacing Amplitude: 3.5 V
Lead Channel Setting Pacing Pulse Width: 0.5 ms
Lead Channel Setting Sensing Sensitivity: 2 mV
Pulse Gen Model: 2272
Pulse Gen Serial Number: 8073798

## 2021-10-12 NOTE — Patient Instructions (Signed)

## 2021-10-12 NOTE — Telephone Encounter (Signed)
Pharmacy Transitions of Care Follow-up Telephone Call  Date of discharge: 09/30/21  Discharge Diagnosis: Complete Heart Block   How have you been since you were released from the hospital? Patient has been doing well since discharge. He has no questions or concerns regarding medications.    Medication changes made at discharge:  START taking: apixaban (ELIQUIS)  metoprolol succinate (TOPROL-XL)  CHANGE how you take: amLODipine (NORVASC)  STOP taking: Advil 200 MG tablet (ibuprofen)  aspirin EC 81 MG tablet   Medication changes verified by the patient? Yes     Medication Accessibility:  Home Pharmacy: [facility]   Was the patient provided with refills on discharged medications? Yes   Have all prescriptions been transferred from [facility] to home pharmacy? Yes   Is the patient able to afford medications? Has insurance, [employer]  Medication Review:   APIXABAN (ELIQUIS) -  - Apixaban 5 mg BID started 09/30/2021 - Discussed importance of taking medication around the same time everyday  - Advised patient of medications to avoid (NSAIDs, ASA)  - Educated that Tylenol (acetaminophen) will be the preferred analgesic to prevent risk of bleeding  - Emphasized importance of monitoring for signs and symptoms of bleeding (abnormal bruising, prolonged bleeding, nose bleeds, bleeding from gums, discolored urine, black tarry stools)  - Advised patient to alert all providers of anticoagulation therapy prior to starting a new medication or having a procedure   Follow-up Appointments:  [facility] f/u appt confirmed? Scheduled to see Will Meredith Leeds, MD on 01/04/22 @ 1:45 PM.   If their condition worsens, is the pt aware to call PCP or go to the Emergency Dept.? Yes  Final Patient Assessment: Patient has refills at home pharmacy. He has Cardiology f/u scheduled for 01/04/2022.

## 2021-10-12 NOTE — Progress Notes (Signed)
Wound check appointment. Steri-strips removed. Wound without redness or edema. Incision edges approximated, wound well healed. Normal device function. Thresholds, sensing, and impedances consistent with implant measurements. Device programmed at 3.5V/auto capture programmed on for extra safety margin until 3 month visit. No mode switches or high ventricular rates noted. Patient educated about wound care, arm mobility, lifting restrictions. ROV in 3 months with implanting physician.

## 2021-10-13 ENCOUNTER — Telehealth: Payer: Self-pay

## 2021-10-13 NOTE — Telephone Encounter (Signed)
Spoke to patient and reports today he noticed on his fit bit his heart rate was in the 50's, then he began to feel lightheaded which has since resvoled. Patient reports he did not eat breakfast but did eat lunch, at that time is when he experienced feeling lightheaded, took his BP which was 131/75. Remote transmission reviewed and shows SR w/ PAC's & PVC's. Patient has hx of PVC's, currently on transmission 2.6% of time are PVC's. Patient denies chest pain, shortness of breath, palpitations or other symptoms. Explained to patient PVC's and PAC's and throw off the numbers on a BP and fit bit machine.  Advised patient I am waiting to review with Dr. Caryl Comes who is in clinic today.

## 2021-10-13 NOTE — Telephone Encounter (Signed)
Reviewed with Dr. Caryl Comes. Would like to patient to make sure he eats regularly and explained to patient again how PVC's can throw off numbers on other devices.  Patient reassured device is working normally. Advised to call if he has further questions or concerns. Appreciative of call.

## 2021-10-13 NOTE — Telephone Encounter (Signed)
The patient Left a voicemail stating he got an adjustment yesterday. He states his heart rate is now in the 50's. I called him and asked him to send a manual transmission for the nurse to review. Transmission received. 10/13/2021.

## 2021-10-19 NOTE — Progress Notes (Unsigned)
Cardiology Office Note:    Date:  10/20/2021   ID:  Dylan Zimmerman, DOB [birth year], MRN [id number]  PCP:  Lujean Amel, MD  Cardiologist:  Shirlee More, MD    Referring MD: Lujean Amel, MD    ASSESSMENT:    1. Complete heart block ([place])   2. Pacemaker   3. Essential hypertension   4. Pure hypercholesterolemia   5. Coronary artery disease of native artery of native heart with stable angina pectoris ([place])   6. PAF (paroxysmal atrial fibrillation) ([place])   7. Chronic anticoagulation    PLAN:    In order of problems listed above:  Improved after pacemaker insertion no further episodes of syncope or near syncope. Stable continue to trend blood pressure at home in the past he has had orthostatic hypotension we will continue his calcium channel blocker thiazide diuretic and beta-blocker. Stable CAD having no anginal discomfort Stable continue his beta-blocker and his anticoagulant   Next appointment: 6 months   Medication Adjustments/Labs and Tests Ordered: Current medicines are reviewed at length with the patient today.  Concerns regarding medicines are outlined above.  No orders of the defined types were placed in this encounter.  No orders of the defined types were placed in this encounter.   Follow-up with syncope heart block and recent pacemaker insertion   History of Present Illness:    Dylan Zimmerman is a 81 y.o. male with a hx of syncope symptomatic PVCs left bundle branch block orthostatic hypotension and implanted loop recorder last seen 03/09/2022.  He has subsequently had another episode of near syncope with documented heart block and long pauses and pacemaker insertion 09/29/2021 he also underwent left heart catheterization 09/28/2021.  Procedures LEFT HEART CATH AND CORONARY ANGIOGRAPHY   Conclusion   Prox LAD lesion is 40% stenosed.   1st RPL lesion is 40% stenosed.   Prox RCA lesion is 20% stenosed.   The left ventricular systolic function is  normal.   LV end diastolic pressure is normal.   There is no aortic valve stenosis. SUMMARY Unusual coronary anatomy, but no significant lesions.  Nothing more than 40% proximal LAD and proximal PL 1. Large dominant RCA with a high bifurcation and very prominent PDA with 2 major prep PL branches. Large left main gives rise to a major circumflex-OM1 with small side branches.   There are 3 branches that come off-1 appears to be the LAD-ramus but there is a diagonal branch that comes off upstream of the circumflex on the left main.   The middle branch appears to be the LAD proper while the more medial branch appears to be essentially a septal perforator trunk with a diagonal.  Neither vessel reaches the apex.  These are all relatively small vessels. Normal LVEDP -> 9 mmHg   His echocardiogram 09/28/2021 showed mild concentric LVH normal ejection fraction 66% grade 1 diastolic dysfunction normal right ventricular size and function and no significant valvular abnormality.  1. Left ventricular ejection fraction by 3D volume is 59 %. The left  ventricle has normal function. The left ventricle has no regional wall  motion abnormalities. There is mild concentric left ventricular  hypertrophy. Left ventricular diastolic  parameters are consistent with Grade I diastolic dysfunction (impaired  relaxation).   2. Right ventricular systolic function is normal. The right ventricular  size is normal. Tricuspid regurgitation signal is inadequate for assessing  PA pressure.   3. The mitral valve is normal in structure. Trivial mitral valve  regurgitation. No evidence of  mitral stenosis.   4. The aortic valve is tricuspid. Aortic valve regurgitation is not  visualized. Aortic valve sclerosis is present, with no evidence of aortic  valve stenosis.   5. There is mild dilatation of the ascending aorta, measuring 38 mm.   6. The inferior vena cava is normal in size with greater than 50%  respiratory  variability, suggesting right atrial pressure of 3 mmHg.  Compliance with diet, lifestyle and medications: Yes  He wears a fit bit is little concerned because he gets rates at times less than 50 and when I looked at the strip he has occasional conducted beats I went back reviewed his telemetry download and he is comfortable that he is programmed normally He feels much more confident he has had no further episodes of syncope or near syncope No edema chest pain shortness of breath or palpitation Past Medical History:  Diagnosis Date   Back pain at L4-L5 level 12/07/2018   Cholelithiasis with chronic cholecystitis 04/20/2019   Chronic cholecystitis    Colorectal polyp detected on colonoscopy 01/01/2020   Elevated liver enzymes 04/20/2019   Essential hypertension 06/18/2018   Hyperlipidemia    Hyperlipidemia    Hypertension    Obesity 06/18/2018   Palpitations 06/18/2018   PVC's (premature ventricular contractions) 08/21/2018   Syncope and collapse     Past Surgical History:  Procedure Laterality Date   CATARACT EXTRACTION Bilateral    CHOLECYSTECTOMY N/A 04/21/2019   Procedure: LAPAROSCOPIC CHOLECYSTECTOMY WITH INTRAOPERATIVE CHOLANGIOGRAM;  Surgeon: Armandina Gemma, MD;  Location: [facility];  Service: General;  Laterality: N/A;   LEFT HEART CATH AND CORONARY ANGIOGRAPHY N/A 09/28/2021   Procedure: LEFT HEART CATH AND CORONARY ANGIOGRAPHY;  Surgeon: Leonie Man, MD;  Location: [facility];  Service: Cardiovascular;  Laterality: N/A;   LITHOTRIPSY     LOOP RECORDER INSERTION N/A 10/04/2018   Procedure: LOOP RECORDER INSERTION;  Surgeon: Constance Haw, MD;  Location: [facility];  Service: Cardiovascular;  Laterality: N/A;   PACEMAKER IMPLANT N/A 09/29/2021   Procedure: PACEMAKER IMPLANT;  Surgeon: Evans Lance, MD;  Location: [facility];  Service: Cardiovascular;  Laterality: N/A;   TONSILLECTOMY      Current Medications: Current Meds   Medication Sig   amLODipine (NORVASC) 5 MG tablet Take 5 mg by mouth every morning.   apixaban (ELIQUIS) 5 MG TABS tablet Take 1 tablet (5 mg total) by mouth 2 (two) times daily.   Ascorbic Acid (VITAMIN C) 500 MG CAPS Take 500 mg by mouth every morning.   atorvastatin (LIPITOR) 10 MG tablet Take 10 mg by mouth every morning.   cholecalciferol (VITAMIN D3) 25 MCG (1000 UNIT) tablet Take 1,000 Units by mouth every morning.   Cyanocobalamin (VITAMIN B-12 PO) Take 1 tablet by mouth every morning.   diclofenac Sodium (VOLTAREN) 1 % GEL Apply 1 Application topically at bedtime as needed (back pain).   dorzolamide-timolol (COSOPT) 22.3-6.8 MG/ML ophthalmic solution Place 1 drop into both eyes 2 (two) times daily.   fluticasone (FLONASE) 50 MCG/ACT nasal spray Place 1 spray into both nostrils at bedtime as needed for allergies or rhinitis.   hydrochlorothiazide (HYDRODIURIL) 25 MG tablet Take 1 tablet (25 mg total) by mouth daily. (Patient taking differently: Take 25 mg by mouth every morning.)   latanoprost (XALATAN) 0.005 % ophthalmic solution Place 1 drop into both eyes at bedtime.   metoprolol succinate (TOPROL-XL) 50 MG 24 hr tablet Take 1 tablet (50 mg  total) by mouth daily. Take with or immediately following a meal.   Multiple Vitamin (MULTIVITAMIN WITH MINERALS) TABS tablet Take 1 tablet by mouth every morning. Centrum Silver   Omega-3 Fatty Acids (FISH OIL) 1000 MG CAPS Take 1,000 mg by mouth every morning.   pseudoephedrine (SUDAFED) 30 MG tablet Take 30 mg by mouth 2 (two) times daily as needed (fluid in ears).   sildenafil (REVATIO) 20 MG tablet Take 20 mg by mouth daily as needed (ED).   zolpidem (AMBIEN) 10 MG tablet Take 10 mg by mouth at bedtime as needed for sleep.      Allergies:   Shellfish allergy, Bactrim [sulfamethoxazole-trimethoprim], and Iodine   Social History   Socioeconomic History   Marital status: Married    Spouse name: Not on file   Number of children: Not on  file   Years of education: Not on file   Highest education level: Not on file  Occupational History   Not on file  Tobacco Use   Smoking status: Never   Smokeless tobacco: Never  Vaping Use   Vaping Use: Never used  Substance and Sexual Activity   Alcohol use: Never   Drug use: Never   Sexual activity: Yes  Other Topics Concern   Not on file  Social History Narrative   Not on file   Social Determinants of Health   Financial Resource Strain: Not on file  Food Insecurity: Not on file  Transportation Needs: Not on file  Physical Activity: Not on file  Stress: Not on file  Social Connections: Not on file     Family History: The patient's family history includes Breast cancer in his mother; Cancer in his father and mother; Heart attack in his maternal grandfather and paternal grandmother; Heart disease in his paternal grandfather and paternal grandmother. ROS:   Please see the history of present illness.    All other systems reviewed and are negative.  EKGs/Labs/Other Studies Reviewed:    The following studies were reviewed today:  EKG performed 09/29/2021 atrial fibrillation left bundle branch block  Recent Labs: 09/26/2021: Magnesium 2.1 09/27/2021: B Natriuretic Peptide 33.8; BUN 20; Creatinine, Ser 1.02; Hemoglobin 13.9; Platelets 161; Potassium 3.7; Sodium 140; TSH 3.088  Recent Lipid Panel    Component Value Date/Time   CHOL 135 09/27/2021 0153   CHOL 163 01/14/2020 0920   TRIG 123 09/27/2021 0153   HDL 36 (L) 09/27/2021 0153   HDL 37 (L) 01/14/2020 0920   CHOLHDL 3.8 09/27/2021 0153   VLDL 25 09/27/2021 0153   LDLCALC 74 09/27/2021 0153   LDLCALC 74 01/14/2020 0920    Physical Exam:    VS:  BP (!) 158/76   Pulse (!) 54   Ht '5\' 11"'$  (1.803 m)   Wt 265 lb 1.3 oz (120.2 kg)   SpO2 96%   BMI 36.97 kg/m     Wt Readings from Last 3 Encounters:  10/20/21 265 lb 1.3 oz (120.2 kg)  09/30/21 269 lb 10 oz (122.3 kg)  09/26/21 269 lb 12.8 oz (122.4 kg)      GEN:  Well nourished, well developed in no acute distress HEENT: Normal NECK: No JVD; No carotid bruits LYMPHATICS: No lymphadenopathy CARDIAC: RRR, no murmurs, rubs, gallops RESPIRATORY:  Clear to auscultation without rales, wheezing or rhonchi  ABDOMEN: Soft, non-tender, non-distended MUSCULOSKELETAL:  No edema; No deformity  SKIN: Warm and dry NEUROLOGIC:  Alert and oriented x 3 PSYCHIATRIC:  Normal affect    Signed, Shirlee More, MD  10/20/2021 2:55 PM    [facility]

## 2021-10-20 ENCOUNTER — Ambulatory Visit (INDEPENDENT_AMBULATORY_CARE_PROVIDER_SITE_OTHER): Payer: Medicare PPO | Admitting: Cardiology

## 2021-10-20 ENCOUNTER — Encounter: Payer: Self-pay | Admitting: Cardiology

## 2021-10-20 VITALS — BP 158/76 | HR 54 | Ht 71.0 in | Wt 265.1 lb

## 2021-10-20 DIAGNOSIS — I10 Essential (primary) hypertension: Secondary | ICD-10-CM

## 2021-10-20 DIAGNOSIS — I442 Atrioventricular block, complete: Secondary | ICD-10-CM

## 2021-10-20 DIAGNOSIS — I480 Paroxysmal atrial fibrillation: Secondary | ICD-10-CM

## 2021-10-20 DIAGNOSIS — E7800 Pure hypercholesterolemia, unspecified: Secondary | ICD-10-CM | POA: Diagnosis not present

## 2021-10-20 DIAGNOSIS — Z950 Presence of cardiac pacemaker: Secondary | ICD-10-CM | POA: Diagnosis not present

## 2021-10-20 DIAGNOSIS — I25118 Atherosclerotic heart disease of native coronary artery with other forms of angina pectoris: Secondary | ICD-10-CM

## 2021-10-20 DIAGNOSIS — I4891 Unspecified atrial fibrillation: Secondary | ICD-10-CM | POA: Insufficient documentation

## 2021-10-20 DIAGNOSIS — H401132 Primary open-angle glaucoma, bilateral, moderate stage: Secondary | ICD-10-CM | POA: Diagnosis not present

## 2021-10-20 DIAGNOSIS — Z7901 Long term (current) use of anticoagulants: Secondary | ICD-10-CM

## 2021-10-20 HISTORY — DX: Presence of cardiac pacemaker: Z95.0

## 2021-10-20 HISTORY — DX: Unspecified atrial fibrillation: I48.91

## 2021-10-20 NOTE — Patient Instructions (Signed)

## 2021-11-08 ENCOUNTER — Telehealth: Payer: Self-pay | Admitting: Cardiology

## 2021-11-08 NOTE — Telephone Encounter (Signed)
   Patient Name: Dylan Zimmerman  DOB: 04-19-1940 MRN: [id number]  Primary Cardiologist: Shirlee More, MD  Chart reviewed as part of pre-operative protocol coverage.   Simple dental extractions (i.e. 1-2 teeth), cleanings are considered low risk procedures per guidelines and generally do not require any specific cardiac clearance. It is also generally accepted that for simple extractions and dental cleanings, there is no need to interrupt blood thinner therapy.   SBE prophylaxis is not required for the patient from a cardiac standpoint.  I will route this recommendation to the requesting party via Epic fax function and remove from pre-op pool.  Please call with questions.  Lenna Sciara, NP 11/08/2021, 12:00 PM

## 2021-11-08 NOTE — Telephone Encounter (Signed)
   Pre-operative Risk Assessment    Patient Name: Dylan Zimmerman  DOB: 1940/04/24 MRN: [id number]     Request for Surgical Clearance    Procedure:   Lonn [place] maintenance  Date of Surgery:  Clearance 11/08/21                                 Surgeon:  Glendon Axe (Dental Hygienist) Surgeon's Group or Practice Name:  Dr. Bess Harvest DDS Phone number:  [phone] Fax number:  [phone]   Type of Clearance Requested:   - Medical    Type of Anesthesia:  None    Additional requests/questions:  Does this patient need antibiotics?  Crist Infante   11/08/2021, 11:53 AM

## 2021-11-21 DIAGNOSIS — Z79899 Other long term (current) drug therapy: Secondary | ICD-10-CM | POA: Diagnosis not present

## 2021-11-21 DIAGNOSIS — R7303 Prediabetes: Secondary | ICD-10-CM | POA: Diagnosis not present

## 2021-11-21 DIAGNOSIS — I442 Atrioventricular block, complete: Secondary | ICD-10-CM | POA: Diagnosis not present

## 2021-11-21 DIAGNOSIS — I480 Paroxysmal atrial fibrillation: Secondary | ICD-10-CM | POA: Diagnosis not present

## 2021-11-21 DIAGNOSIS — Z950 Presence of cardiac pacemaker: Secondary | ICD-10-CM | POA: Diagnosis not present

## 2021-11-21 DIAGNOSIS — E7800 Pure hypercholesterolemia, unspecified: Secondary | ICD-10-CM | POA: Diagnosis not present

## 2021-12-29 ENCOUNTER — Other Ambulatory Visit (HOSPITAL_BASED_OUTPATIENT_CLINIC_OR_DEPARTMENT_OTHER): Payer: Self-pay | Admitting: Family Medicine

## 2021-12-29 DIAGNOSIS — R310 Gross hematuria: Secondary | ICD-10-CM

## 2021-12-30 ENCOUNTER — Ambulatory Visit (INDEPENDENT_AMBULATORY_CARE_PROVIDER_SITE_OTHER): Payer: Medicare PPO

## 2021-12-30 DIAGNOSIS — I442 Atrioventricular block, complete: Secondary | ICD-10-CM | POA: Diagnosis not present

## 2021-12-30 LAB — CUP PACEART REMOTE DEVICE CHECK
Battery Remaining Longevity: 69 mo
Battery Remaining Percentage: 95.5 %
Battery Voltage: 3.01 V
Brady Statistic AP VP Percent: 6.3 %
Brady Statistic AP VS Percent: 86 %
Brady Statistic AS VP Percent: 1 %
Brady Statistic AS VS Percent: 1.3 %
Brady Statistic RA Percent Paced: 80 %
Brady Statistic RV Percent Paced: 6.6 %
Date Time Interrogation Session: 20231006034721
Implantable Lead Implant Date: 20230706
Implantable Lead Implant Date: 20230706
Implantable Lead Location: 753859
Implantable Lead Location: 753860
Implantable Pulse Generator Implant Date: 20230706
Lead Channel Impedance Value: 430 Ohm
Lead Channel Impedance Value: 450 Ohm
Lead Channel Pacing Threshold Amplitude: 0.75 V
Lead Channel Pacing Threshold Amplitude: 0.75 V
Lead Channel Pacing Threshold Pulse Width: 0.4 ms
Lead Channel Pacing Threshold Pulse Width: 0.5 ms
Lead Channel Sensing Intrinsic Amplitude: 1.6 mV
Lead Channel Sensing Intrinsic Amplitude: 9.2 mV
Lead Channel Setting Pacing Amplitude: 3.5 V
Lead Channel Setting Pacing Amplitude: 3.5 V
Lead Channel Setting Pacing Pulse Width: 0.5 ms
Lead Channel Setting Sensing Sensitivity: 2 mV
Pulse Gen Model: 2272
Pulse Gen Serial Number: 8073798

## 2022-01-03 ENCOUNTER — Other Ambulatory Visit (HOSPITAL_BASED_OUTPATIENT_CLINIC_OR_DEPARTMENT_OTHER): Payer: Self-pay | Admitting: Family Medicine

## 2022-01-03 ENCOUNTER — Encounter: Payer: Medicare PPO | Admitting: Internal Medicine

## 2022-01-03 ENCOUNTER — Ambulatory Visit (HOSPITAL_BASED_OUTPATIENT_CLINIC_OR_DEPARTMENT_OTHER)
Admission: RE | Admit: 2022-01-03 | Discharge: 2022-01-03 | Disposition: A | Discharge status: Home / Self Care | Payer: Medicare PPO | Source: Ambulatory Visit | Attending: Family Medicine | Admitting: Family Medicine

## 2022-01-03 ENCOUNTER — Encounter (HOSPITAL_BASED_OUTPATIENT_CLINIC_OR_DEPARTMENT_OTHER): Payer: Self-pay

## 2022-01-03 DIAGNOSIS — R310 Gross hematuria: Secondary | ICD-10-CM

## 2022-01-03 DIAGNOSIS — N2889 Other specified disorders of kidney and ureter: Secondary | ICD-10-CM | POA: Diagnosis not present

## 2022-01-03 MED ORDER — IOHEXOL 300 MG/ML  SOLN: 100.0000 mL | Freq: Once | INTRAMUSCULAR | Status: DC | PRN

## 2022-01-03 NOTE — Progress Notes (Signed)
Remote pacemaker transmission.   

## 2022-01-04 ENCOUNTER — Ambulatory Visit: Payer: Medicare PPO | Attending: Internal Medicine | Admitting: Cardiology

## 2022-01-04 ENCOUNTER — Encounter: Payer: Self-pay | Admitting: Cardiology

## 2022-01-04 VITALS — BP 124/66 | HR 66 | Ht 71.0 in | Wt 264.6 lb

## 2022-01-04 DIAGNOSIS — D6869 Other thrombophilia: Secondary | ICD-10-CM | POA: Diagnosis not present

## 2022-01-04 DIAGNOSIS — I442 Atrioventricular block, complete: Secondary | ICD-10-CM

## 2022-01-04 DIAGNOSIS — I10 Essential (primary) hypertension: Secondary | ICD-10-CM

## 2022-01-04 DIAGNOSIS — I493 Ventricular premature depolarization: Secondary | ICD-10-CM | POA: Diagnosis not present

## 2022-01-04 DIAGNOSIS — I480 Paroxysmal atrial fibrillation: Secondary | ICD-10-CM | POA: Diagnosis not present

## 2022-01-04 MED ORDER — APIXABAN 5 MG PO TABS: 5.0000 mg | ORAL_TABLET | Freq: Two times a day (BID) | ORAL | 5 refills | Status: DC

## 2022-01-04 MED ORDER — METOPROLOL SUCCINATE ER 50 MG PO TB24: 50.0000 mg | ORAL_TABLET | Freq: Every day | ORAL | 5 refills | Status: DC

## 2022-01-04 NOTE — Patient Instructions (Signed)
Medication Instructions:  Your physician recommends that you continue on your current medications as directed. Please refer to the Current Medication list given to you today.  *If you need a refill on your cardiac medications before your next appointment, please call your pharmacy*   Lab Work: None ordered  Testing/Procedures: None ordered   Follow-Up: At [facility], you and your health needs are our priority.  As part of our continuing mission to provide you with exceptional heart care, we have created designated Provider Care Teams.  These Care Teams include your primary Cardiologist (physician) and Advanced Practice Providers (APPs -  Physician Assistants and Nurse Practitioners) who all work together to provide you with the care you need, when you need it.  Remote monitoring is used to monitor your Pacemaker or ICD from home. This monitoring reduces the number of office visits required to check your device to one time per year. It allows [place] to keep an eye on the functioning of your device to ensure it is working properly. You are scheduled for a device check from home on 03/31/2022. You may send your transmission at any time that day. If you have a wireless device, the transmission will be sent automatically. After your physician reviews your transmission, you will receive a postcard with your next transmission date.  Your next appointment:   1 year(s)  The format for your next appointment:   In Person  Provider:   Allegra Lai, MD    Thank you for choosing [facility]!!   Trinidad Curet, RN [phone]    Other Instructions  Important Information About Sugar

## 2022-01-04 NOTE — Progress Notes (Signed)
Electrophysiology Office Note   Date:  01/04/2022   ID:  Dylan Zimmerman, DOB 1941-01-25, MRN [id number]  PCP:  Lujean Amel, MD  Cardiologist:  Bettina Gavia Primary Electrophysiologist:  Matan Steen Meredith Leeds, MD    Chief Complaint: Pacemaker   History of Present Illness: Dylan Zimmerman is a 81 y.o. male who is being seen today for the evaluation of pacemaker at the request of Koirala, Dibas, MD. Presenting today for electrophysiology evaluation.  He has a history significant for hypertension, hyperlipidemia, obesity, PVCs, syncope.  He had an Abbott ILR implanted for syncope.  ILR showed evidence of complete heart block.  He is now status post Abbott dual-chamber pacemaker implanted 6 09/29/2021.  Today, denies symptoms of palpitations, chest pain, shortness of breath, orthopnea, PND, lower extremity edema, claudication, dizziness, presyncope, syncope, bleeding, or neurologic sequela. The patient is tolerating medications without difficulties.    Past Medical History:  Diagnosis Date   Back pain at L4-L5 level 12/07/2018   Cholelithiasis with chronic cholecystitis 04/20/2019   Chronic cholecystitis    Colorectal polyp detected on colonoscopy 01/01/2020   Elevated liver enzymes 04/20/2019   Essential hypertension 06/18/2018   Hyperlipidemia    Hyperlipidemia    Hypertension    Obesity 06/18/2018   Palpitations 06/18/2018   PVC's (premature ventricular contractions) 08/21/2018   Syncope and collapse    Past Surgical History:  Procedure Laterality Date   CATARACT EXTRACTION Bilateral    CHOLECYSTECTOMY N/A 04/21/2019   Procedure: LAPAROSCOPIC CHOLECYSTECTOMY WITH INTRAOPERATIVE CHOLANGIOGRAM;  Surgeon: Armandina Gemma, MD;  Location: [facility];  Service: General;  Laterality: N/A;   LEFT HEART CATH AND CORONARY ANGIOGRAPHY N/A 09/28/2021   Procedure: LEFT HEART CATH AND CORONARY ANGIOGRAPHY;  Surgeon: Leonie Man, MD;  Location: [facility];  Service: Cardiovascular;   Laterality: N/A;   LITHOTRIPSY     LOOP RECORDER INSERTION N/A 10/04/2018   Procedure: LOOP RECORDER INSERTION;  Surgeon: Constance Haw, MD;  Location: [facility];  Service: Cardiovascular;  Laterality: N/A;   PACEMAKER IMPLANT N/A 09/29/2021   Procedure: PACEMAKER IMPLANT;  Surgeon: Evans Lance, MD;  Location: [facility];  Service: Cardiovascular;  Laterality: N/A;   TONSILLECTOMY       Current Outpatient Medications  Medication Sig Dispense Refill   amLODipine (NORVASC) 5 MG tablet Take 5 mg by mouth every morning.     atorvastatin (LIPITOR) 10 MG tablet Take 10 mg by mouth every morning.     diclofenac Sodium (VOLTAREN) 1 % GEL Apply 1 Application topically at bedtime as needed (back pain).     dorzolamide-timolol (COSOPT) 22.3-6.8 MG/ML ophthalmic solution Place 1 drop into both eyes 2 (two) times daily.     fluticasone (FLONASE) 50 MCG/ACT nasal spray Place 1 spray into both nostrils at bedtime as needed for allergies or rhinitis.     hydrochlorothiazide (HYDRODIURIL) 25 MG tablet Take 1 tablet (25 mg total) by mouth daily. (Patient taking differently: Take 25 mg by mouth every morning.) 90 tablet 3   latanoprost (XALATAN) 0.005 % ophthalmic solution Place 1 drop into both eyes at bedtime.     Multiple Vitamin (MULTIVITAMIN WITH MINERALS) TABS tablet Take 1 tablet by mouth every morning. Centrum Silver     Omega-3 Fatty Acids (FISH OIL) 1000 MG CAPS Take 1,000 mg by mouth every morning.     pseudoephedrine (SUDAFED) 30 MG tablet Take 30 mg by mouth 2 (two) times daily as needed (fluid in ears).  sildenafil (REVATIO) 20 MG tablet Take 20 mg by mouth daily as needed (ED).     zolpidem (AMBIEN) 10 MG tablet Take 10 mg by mouth at bedtime as needed for sleep.      apixaban (ELIQUIS) 5 MG TABS tablet Take 1 tablet (5 mg total) by mouth 2 (two) times daily. 60 tablet 5   metoprolol succinate (TOPROL-XL) 50 MG 24 hr tablet Take 1 tablet (50 mg total) by mouth daily.  Take with or immediately following a meal. 30 tablet 5   No current facility-administered medications for this visit.    Allergies:   Shellfish allergy, Bactrim [sulfamethoxazole-trimethoprim], and Iodine   Social History:  The patient  reports that he has never smoked. He has never used smokeless tobacco. He reports that he does not drink alcohol and does not use drugs.   Family History:  The patient's family history includes Breast cancer in his mother; Cancer in his father and mother; Heart attack in his maternal grandfather and paternal grandmother; Heart disease in his paternal grandfather and paternal grandmother.   ROS:  Please see the history of present illness.   Otherwise, review of systems is positive for none.   All other systems are reviewed and negative.   PHYSICAL EXAM: VS:  BP 124/66   Pulse 66   Ht '5\' 11"'$  (1.803 m)   Wt 264 lb 9.6 oz (120 kg)   SpO2 94%   BMI 36.90 kg/m  , BMI Body mass index is 36.9 kg/m. GEN: Well nourished, well developed, in no acute distress  HEENT: normal  Neck: no JVD, carotid bruits, or masses Cardiac: RRR; no murmurs, rubs, or gallops,no edema  Respiratory:  clear to auscultation bilaterally, normal work of breathing GI: soft, nontender, nondistended, + BS MS: no deformity or atrophy  Skin: warm and dry, device site well healed Neuro:  Strength and sensation are intact Psych: euthymic mood, full affect  EKG:  EKG is ordered today. Personal review of the ekg ordered shows AV paced, PVCs  Personal review of the device interrogation today. Results in [facility]: 09/26/2021: Magnesium 2.1 09/27/2021: B Natriuretic Peptide 33.8; BUN 20; Creatinine, Ser 1.02; Hemoglobin 13.9; Platelets 161; Potassium 3.7; Sodium 140; TSH 3.088    Lipid Panel     Component Value Date/Time   CHOL 135 09/27/2021 0153   CHOL 163 01/14/2020 0920   TRIG 123 09/27/2021 0153   HDL 36 (L) 09/27/2021 0153   HDL 37 (L) 01/14/2020 0920   CHOLHDL 3.8  09/27/2021 0153   VLDL 25 09/27/2021 0153   LDLCALC 74 09/27/2021 0153   LDLCALC 74 01/14/2020 0920     Wt Readings from Last 3 Encounters:  01/04/22 264 lb 9.6 oz (120 kg)  10/20/21 265 lb 1.3 oz (120.2 kg)  09/30/21 269 lb 10 oz (122.3 kg)      Other studies Reviewed: Additional studies/ records that were reviewed today include: TTE 09/04/18  Review of the above records today demonstrates:   1. The left ventricle has normal systolic function with an ejection  fraction of 60-65%. The cavity size was normal. There is mild concentric  left ventricular hypertrophy. Left ventricular diastolic Doppler  parameters are consistent with impaired  relaxation. There is mildly abnormal septal motion consistent with left  bundle branch block.   2. The right ventricle has normal systolic function. The cavity was  mildly enlarged. There is no increase in right ventricular wall thickness.   3. No evidence  of mitral valve stenosis.   4. No stenosis of the aortic valve.   5. 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    ASSESSMENT AND PLAN:  1.  Complete heart block: Recurrent syncope.  Was status post Abbott ILR showing complete heart block.  Status post Abbott dual-chamber pacemaker implanted 723.  Device function appropriate.  No changes.  2.  Hypertension: Currently well controlled  3.  PVCs: Asymptomatic.  Ejection fraction remains normal.  4.  Paroxysmal atrial fibrillation: Currently on Eliquis 5 mg twice daily.  CHA2DS2-VASc of 3.  Minimal noted on device interrogation.  No changes.  5.  Secondary hypercoagulable state: Currently on Eliquis for atrial fibrillation as above  Current medicines are reviewed at length with the patient today.   The patient does not have concerns regarding his medicines.  The following changes were made today: None  Labs/ tests ordered today include:  Orders Placed This Encounter  Procedures   EKG 12-Lead      Disposition:    FU 12 months  Signed, Valina Maes Meredith Leeds, MD  01/04/2022 2:09 PM     [address] [place] [address] [phone] (office) [phone] (fax)

## 2022-01-11 ENCOUNTER — Ambulatory Visit: Payer: Medicare PPO | Admitting: Cardiology

## 2022-01-12 ENCOUNTER — Encounter: Payer: Self-pay | Admitting: Cardiology

## 2022-01-12 DIAGNOSIS — R8271 Bacteriuria: Secondary | ICD-10-CM | POA: Diagnosis not present

## 2022-01-12 DIAGNOSIS — R310 Gross hematuria: Secondary | ICD-10-CM | POA: Diagnosis not present

## 2022-01-12 NOTE — Telephone Encounter (Signed)
Called and spoke with pt. Advised to hold Eliquis per Dr. Curt Bears. Pt states he saw urology today, but is having further bladder scan/testing next week. Advised to hold until further evaluation by urology. Pt will update [place] after testing to determine if can restart a blood thinner. Pt agreeable to plan.

## 2022-01-23 DIAGNOSIS — L821 Other seborrheic keratosis: Secondary | ICD-10-CM | POA: Diagnosis not present

## 2022-01-23 DIAGNOSIS — D225 Melanocytic nevi of trunk: Secondary | ICD-10-CM | POA: Diagnosis not present

## 2022-01-23 DIAGNOSIS — L570 Actinic keratosis: Secondary | ICD-10-CM | POA: Diagnosis not present

## 2022-01-23 DIAGNOSIS — Z85828 Personal history of other malignant neoplasm of skin: Secondary | ICD-10-CM | POA: Diagnosis not present

## 2022-01-23 DIAGNOSIS — D1801 Hemangioma of skin and subcutaneous tissue: Secondary | ICD-10-CM | POA: Diagnosis not present

## 2022-01-30 DIAGNOSIS — R310 Gross hematuria: Secondary | ICD-10-CM | POA: Diagnosis not present

## 2022-01-30 DIAGNOSIS — R8279 Other abnormal findings on microbiological examination of urine: Secondary | ICD-10-CM | POA: Diagnosis not present

## 2022-01-30 DIAGNOSIS — R3912 Poor urinary stream: Secondary | ICD-10-CM | POA: Diagnosis not present

## 2022-02-01 NOTE — Telephone Encounter (Signed)
Followed up w/ pt after not hearing back from him. He informs me that he has another appt on 12/22 w/ urology, reports they may be crushing a kidney stone. He is still holding Eliquis as urology is still investigating things. Advised to update me after December appt on urology findings/recommendation. Patient verbalized understanding and agreeable to plan.

## 2022-02-28 DIAGNOSIS — Z961 Presence of intraocular lens: Secondary | ICD-10-CM | POA: Diagnosis not present

## 2022-02-28 DIAGNOSIS — H53021 Refractive amblyopia, right eye: Secondary | ICD-10-CM | POA: Diagnosis not present

## 2022-02-28 DIAGNOSIS — H401132 Primary open-angle glaucoma, bilateral, moderate stage: Secondary | ICD-10-CM | POA: Diagnosis not present

## 2022-02-28 DIAGNOSIS — Z9889 Other specified postprocedural states: Secondary | ICD-10-CM | POA: Diagnosis not present

## 2022-03-17 DIAGNOSIS — R310 Gross hematuria: Secondary | ICD-10-CM | POA: Diagnosis not present

## 2022-03-17 DIAGNOSIS — N200 Calculus of kidney: Secondary | ICD-10-CM | POA: Diagnosis not present

## 2022-03-17 DIAGNOSIS — N401 Enlarged prostate with lower urinary tract symptoms: Secondary | ICD-10-CM | POA: Diagnosis not present

## 2022-03-17 DIAGNOSIS — R3912 Poor urinary stream: Secondary | ICD-10-CM | POA: Diagnosis not present

## 2022-03-31 ENCOUNTER — Ambulatory Visit (INDEPENDENT_AMBULATORY_CARE_PROVIDER_SITE_OTHER): Payer: Medicare PPO

## 2022-03-31 DIAGNOSIS — I442 Atrioventricular block, complete: Secondary | ICD-10-CM

## 2022-03-31 LAB — CUP PACEART REMOTE DEVICE CHECK
Battery Remaining Longevity: 108 mo
Battery Remaining Percentage: 95.5 %
Battery Voltage: 3.01 V
Brady Statistic AP VP Percent: 4.6 %
Brady Statistic AP VS Percent: 87 %
Brady Statistic AS VP Percent: 1 %
Brady Statistic AS VS Percent: 2.6 %
Brady Statistic RA Percent Paced: 81 %
Brady Statistic RV Percent Paced: 4.9 %
Date Time Interrogation Session: 20240105020015
Implantable Lead Connection Status: 753985
Implantable Lead Connection Status: 753985
Implantable Lead Implant Date: 20230706
Implantable Lead Implant Date: 20230706
Implantable Lead Location: 753859
Implantable Lead Location: 753860
Implantable Pulse Generator Implant Date: 20230706
Lead Channel Impedance Value: 400 Ohm
Lead Channel Impedance Value: 440 Ohm
Lead Channel Pacing Threshold Amplitude: 0.5 V
Lead Channel Pacing Threshold Amplitude: 0.875 V
Lead Channel Pacing Threshold Pulse Width: 0.4 ms
Lead Channel Pacing Threshold Pulse Width: 0.5 ms
Lead Channel Sensing Intrinsic Amplitude: 2.7 mV
Lead Channel Sensing Intrinsic Amplitude: 5.2 mV
Lead Channel Setting Pacing Amplitude: 1.125
Lead Channel Setting Pacing Amplitude: 2 V
Lead Channel Setting Pacing Pulse Width: 0.5 ms
Lead Channel Setting Sensing Sensitivity: 2 mV
Pulse Gen Model: 2272
Pulse Gen Serial Number: 8073798

## 2022-04-17 NOTE — Progress Notes (Signed)
Remote pacemaker transmission.   

## 2022-06-05 DIAGNOSIS — Z125 Encounter for screening for malignant neoplasm of prostate: Secondary | ICD-10-CM | POA: Diagnosis not present

## 2022-06-05 DIAGNOSIS — I700 Atherosclerosis of aorta: Secondary | ICD-10-CM | POA: Diagnosis not present

## 2022-06-05 DIAGNOSIS — E7800 Pure hypercholesterolemia, unspecified: Secondary | ICD-10-CM | POA: Diagnosis not present

## 2022-06-05 DIAGNOSIS — E119 Type 2 diabetes mellitus without complications: Secondary | ICD-10-CM | POA: Diagnosis not present

## 2022-06-05 DIAGNOSIS — I480 Paroxysmal atrial fibrillation: Secondary | ICD-10-CM | POA: Diagnosis not present

## 2022-06-05 DIAGNOSIS — I442 Atrioventricular block, complete: Secondary | ICD-10-CM | POA: Diagnosis not present

## 2022-06-05 DIAGNOSIS — Z0001 Encounter for general adult medical examination with abnormal findings: Secondary | ICD-10-CM | POA: Diagnosis not present

## 2022-06-05 DIAGNOSIS — I10 Essential (primary) hypertension: Secondary | ICD-10-CM | POA: Diagnosis not present

## 2022-06-05 DIAGNOSIS — Z79899 Other long term (current) drug therapy: Secondary | ICD-10-CM | POA: Diagnosis not present

## 2022-06-05 DIAGNOSIS — D6869 Other thrombophilia: Secondary | ICD-10-CM | POA: Diagnosis not present

## 2022-06-30 ENCOUNTER — Ambulatory Visit (INDEPENDENT_AMBULATORY_CARE_PROVIDER_SITE_OTHER): Payer: Medicare PPO

## 2022-06-30 DIAGNOSIS — I442 Atrioventricular block, complete: Secondary | ICD-10-CM

## 2022-07-02 LAB — CUP PACEART REMOTE DEVICE CHECK
Battery Remaining Longevity: 106 mo
Battery Remaining Percentage: 94 %
Battery Voltage: 3.01 V
Brady Statistic AP VP Percent: 5.7 %
Brady Statistic AP VS Percent: 82 %
Brady Statistic AS VP Percent: 1 %
Brady Statistic AS VS Percent: 4 %
Brady Statistic RA Percent Paced: 74 %
Brady Statistic RV Percent Paced: 6 %
Date Time Interrogation Session: 20240405020012
Implantable Lead Connection Status: 753985
Implantable Lead Connection Status: 753985
Implantable Lead Implant Date: 20230706
Implantable Lead Implant Date: 20230706
Implantable Lead Location: 753859
Implantable Lead Location: 753860
Implantable Pulse Generator Implant Date: 20230706
Lead Channel Impedance Value: 400 Ohm
Lead Channel Impedance Value: 430 Ohm
Lead Channel Pacing Threshold Amplitude: 0.5 V
Lead Channel Pacing Threshold Amplitude: 0.75 V
Lead Channel Pacing Threshold Pulse Width: 0.4 ms
Lead Channel Pacing Threshold Pulse Width: 0.5 ms
Lead Channel Sensing Intrinsic Amplitude: 1.4 mV
Lead Channel Sensing Intrinsic Amplitude: 5 mV
Lead Channel Setting Pacing Amplitude: 1 V
Lead Channel Setting Pacing Amplitude: 2 V
Lead Channel Setting Pacing Pulse Width: 0.5 ms
Lead Channel Setting Sensing Sensitivity: 2 mV
Pulse Gen Model: 2272
Pulse Gen Serial Number: 8073798

## 2022-07-05 ENCOUNTER — Other Ambulatory Visit: Payer: Self-pay | Admitting: Cardiology

## 2022-07-09 ENCOUNTER — Other Ambulatory Visit: Payer: Self-pay | Admitting: Cardiology

## 2022-07-09 DIAGNOSIS — R55 Syncope and collapse: Secondary | ICD-10-CM

## 2022-07-10 NOTE — Telephone Encounter (Signed)
Rx refill sent to pharmacy. 

## 2022-07-25 DIAGNOSIS — D2372 Other benign neoplasm of skin of left lower limb, including hip: Secondary | ICD-10-CM | POA: Diagnosis not present

## 2022-07-25 DIAGNOSIS — Z85828 Personal history of other malignant neoplasm of skin: Secondary | ICD-10-CM | POA: Diagnosis not present

## 2022-07-25 DIAGNOSIS — D225 Melanocytic nevi of trunk: Secondary | ICD-10-CM | POA: Diagnosis not present

## 2022-07-25 DIAGNOSIS — L814 Other melanin hyperpigmentation: Secondary | ICD-10-CM | POA: Diagnosis not present

## 2022-07-25 DIAGNOSIS — L821 Other seborrheic keratosis: Secondary | ICD-10-CM | POA: Diagnosis not present

## 2022-07-25 DIAGNOSIS — L570 Actinic keratosis: Secondary | ICD-10-CM | POA: Diagnosis not present

## 2022-08-02 NOTE — Progress Notes (Signed)
Remote pacemaker transmission.   

## 2022-08-31 DIAGNOSIS — H53021 Refractive amblyopia, right eye: Secondary | ICD-10-CM | POA: Diagnosis not present

## 2022-08-31 DIAGNOSIS — H401132 Primary open-angle glaucoma, bilateral, moderate stage: Secondary | ICD-10-CM | POA: Diagnosis not present

## 2022-08-31 DIAGNOSIS — Z961 Presence of intraocular lens: Secondary | ICD-10-CM | POA: Diagnosis not present

## 2022-09-11 DIAGNOSIS — N401 Enlarged prostate with lower urinary tract symptoms: Secondary | ICD-10-CM | POA: Diagnosis not present

## 2022-09-11 DIAGNOSIS — N200 Calculus of kidney: Secondary | ICD-10-CM | POA: Diagnosis not present

## 2022-09-11 DIAGNOSIS — R3912 Poor urinary stream: Secondary | ICD-10-CM | POA: Diagnosis not present

## 2022-10-02 LAB — CUP PACEART REMOTE DEVICE CHECK
Battery Remaining Longevity: 103 mo
Battery Remaining Percentage: 91 %
Battery Voltage: 3.01 V
Brady Statistic AP VP Percent: 4.6 %
Brady Statistic AP VS Percent: 84 %
Brady Statistic AS VP Percent: 1 %
Brady Statistic AS VS Percent: 4.1 %
Brady Statistic RA Percent Paced: 76 %
Brady Statistic RV Percent Paced: 4.9 %
Date Time Interrogation Session: 20240707054601
Implantable Lead Connection Status: 753985
Implantable Lead Connection Status: 753985
Implantable Lead Implant Date: 20230706
Implantable Lead Implant Date: 20230706
Implantable Lead Location: 753859
Implantable Lead Location: 753860
Implantable Pulse Generator Implant Date: 20230706
Lead Channel Impedance Value: 410 Ohm
Lead Channel Impedance Value: 440 Ohm
Lead Channel Pacing Threshold Amplitude: 0.5 V
Lead Channel Pacing Threshold Amplitude: 0.75 V
Lead Channel Pacing Threshold Pulse Width: 0.4 ms
Lead Channel Pacing Threshold Pulse Width: 0.5 ms
Lead Channel Sensing Intrinsic Amplitude: 2.1 mV
Lead Channel Sensing Intrinsic Amplitude: 6.1 mV
Lead Channel Setting Pacing Amplitude: 1 V
Lead Channel Setting Pacing Amplitude: 2 V
Lead Channel Setting Pacing Pulse Width: 0.5 ms
Lead Channel Setting Sensing Sensitivity: 2 mV
Pulse Gen Model: 2272
Pulse Gen Serial Number: 8073798

## 2022-10-06 ENCOUNTER — Ambulatory Visit (INDEPENDENT_AMBULATORY_CARE_PROVIDER_SITE_OTHER): Payer: Medicare PPO

## 2022-10-06 ENCOUNTER — Other Ambulatory Visit: Payer: Self-pay | Admitting: Cardiology

## 2022-10-06 DIAGNOSIS — I442 Atrioventricular block, complete: Secondary | ICD-10-CM

## 2022-10-06 DIAGNOSIS — R55 Syncope and collapse: Secondary | ICD-10-CM

## 2022-10-20 NOTE — Progress Notes (Signed)
Remote pacemaker transmission.   

## 2022-10-22 NOTE — Progress Notes (Unsigned)
Cardiology Office Note:    Date:  10/23/2022   ID:  Dylan Zimmerman, DOB Aug 07, 1940, MRN [id number]  PCP:  Darrow Bussing, MD  Cardiologist:  Norman Herrlich, MD    Referring MD: Darrow Bussing, MD    ASSESSMENT:    1. PAF (paroxysmal atrial fibrillation) (HCC)   2. Chronic anticoagulation   3. Complete AV block (HCC)   4. Pacemaker   5. Coronary artery disease of native artery of native heart with stable angina pectoris (HCC)   6. Essential hypertension   7. Pure hypercholesterolemia    PLAN:    In order of problems listed above:  Difficult situation with recurrent atrial fibrillation inability to tolerate anticoagulant with GI bleeding and renal calculi. I asked him to try half dose Eliquis for now Referral to EP for consideration of Watchman device versus urologic surgery to remove his stone he [place] to have this done Stable pacemaker function followed device clinic Hypertension at target continue his current medications including metoprolol Lipids at target continue statin Stable CAD having no anginal discomfort   Next appointment: 6 months   Medication Adjustments/Labs and Tests Ordered: Current medicines are reviewed at length with the patient today.  Concerns regarding medicines are outlined above.  Orders Placed This Encounter  Procedures   Ambulatory referral to Cardiac Electrophysiology   Meds ordered this encounter  Medications   apixaban (ELIQUIS) 2.5 MG TABS tablet    Sig: Take 1 tablet (2.5 mg total) by mouth 2 (two) times daily.    Dispense:  180 tablet    Refill:  3     History of Present Illness:    Dylan Zimmerman is a 82 y.o. male with a hx of syncope symptomatic PVCs left bundle branch block orthostatic hypotension heart block and permanent pacemaker along with mild nonobstructive CAD last seen 10/20/2021  Compliance with diet, lifestyle and medications: Yes  He is off his anticoagulant because of recurrent GI bleeding with renal calculus.  He  relates that his urologist is hesitant to operate because of surgical and infectious risk.  Device interrogation shows he is having paroxysmal atrial fibrillation with prolonged episodes.  His wife is present for evaluation today. Recent labs include cholesterol 151 LDL 68 triglyceride 251 HDL 41 A1c 6.4 sodium 141 potassium 4.0 creatinine 1.03 with a GFR 72 cc/min I reviewed his smart watch strips he has episodes of atrial fibrillation with a controlled ventricular rate Past Medical History:  Diagnosis Date   Back pain at L4-L5 level 12/07/2018   Cholelithiasis with chronic cholecystitis 04/20/2019   Chronic cholecystitis    Colorectal polyp detected on colonoscopy 01/01/2020   Elevated liver enzymes 04/20/2019   Essential hypertension 06/18/2018   Hyperlipidemia    Hyperlipidemia    Hypertension    Obesity 06/18/2018   Palpitations 06/18/2018   PVC's (premature ventricular contractions) 08/21/2018   Syncope and collapse     Current Medications: Current Meds  Medication Sig   amLODipine (NORVASC) 5 MG tablet Take 5 mg by mouth every morning.   apixaban (ELIQUIS) 2.5 MG TABS tablet Take 1 tablet (2.5 mg total) by mouth 2 (two) times daily.   atorvastatin (LIPITOR) 10 MG tablet Take 10 mg by mouth every morning.   diclofenac Sodium (VOLTAREN) 1 % GEL Apply 1 Application topically at bedtime as needed (back pain).   dorzolamide-timolol (COSOPT) 22.3-6.8 MG/ML ophthalmic solution Place 1 drop into both eyes 2 (two) times daily.   fluticasone (FLONASE) 50 MCG/ACT nasal spray Place 1 spray into  both nostrils at bedtime as needed for allergies or rhinitis.   hydrochlorothiazide (HYDRODIURIL) 25 MG tablet TAKE 1 TABLET (25 MG TOTAL) BY MOUTH DAILY.   latanoprost (XALATAN) 0.005 % ophthalmic solution Place 1 drop into both eyes at bedtime.   metoprolol succinate (TOPROL-XL) 50 MG 24 hr tablet TAKE 1 TABLET BY MOUTH DAILY. TAKE WITH OR IMMEDIATELY FOLLOWING A MEAL.   Multiple Vitamin (MULTIVITAMIN  WITH MINERALS) TABS tablet Take 1 tablet by mouth every morning. Centrum Silver   Omega-3 Fatty Acids (FISH OIL) 1000 MG CAPS Take 1,000 mg by mouth every morning.   pseudoephedrine (SUDAFED) 30 MG tablet Take 30 mg by mouth 2 (two) times daily as needed (fluid in ears).   sildenafil (REVATIO) 20 MG tablet Take 20 mg by mouth daily as needed (ED).   zolpidem (AMBIEN) 10 MG tablet Take 10 mg by mouth at bedtime as needed for sleep.       EKGs/Labs/Other Studies Reviewed:    The following studies were reviewed today:  Cardiac Studies & Procedures   CARDIAC CATHETERIZATION  CARDIAC CATHETERIZATION 09/28/2021  Narrative   Prox LAD lesion is 40% stenosed.   1st RPL lesion is 40% stenosed.   Prox RCA lesion is 20% stenosed.   The left ventricular systolic function is normal.   LV end diastolic pressure is normal.   There is no aortic valve stenosis.  SUMMARY Unusual coronary anatomy, but no significant lesions.  Nothing more than 40% proximal LAD and proximal PL 1. Large dominant RCA with a high bifurcation and very prominent PDA with 2 major prep PL branches. Large left main gives rise to a major circumflex-OM1 with small side branches. There are 3 branches that come off-1 appears to be the LAD-ramus but there is a diagonal branch that comes off upstream of the circumflex on the left main. The middle branch appears to be the LAD proper while the more medial branch appears to be essentially a septal perforator trunk with a diagonal.  Neither vessel reaches the apex.  These are all relatively small vessels. Normal LVEDP -> 9 mmHg  RECOMMENDATIONS: Recommend proceeding with pacemaker placement in the morning.   Bryan Lemma, MD  Findings Coronary Findings Diagnostic  Dominance: Right  Left Main Vessel was injected. Vessel is normal in caliber and large. Vessel is angiographically normal.  Left Anterior Descending Vessel was injected. Vessel is normal in caliber. Vessel is  angiographically normal. Prox LAD lesion is 40% stenosed.  First Diagonal Branch Vessel is small in size.  First Septal Branch Vessel is small in size.  Third Septal Branch Vessel is small in size.  Ramus Intermedius Vessel is moderate in size.  Left Circumflex Vessel was injected. Vessel is normal in caliber. Vessel is angiographically normal.  First Obtuse Marginal Branch Vessel is small in size.  Left Posterior Atrioventricular Artery Vessel is small in size.  Right Coronary Artery Vessel was injected. Vessel is large. Vessel is angiographically normal. Prox RCA lesion is 20% stenosed.  Right Ventricular Branch Vessel is small in size.  Right Posterior Descending Artery Vessel is large in size.  Right Posterior Atrioventricular Artery Vessel is large in size.  First Right Posterolateral Branch 1st RPL lesion is 40% stenosed.  Second Right Posterolateral Branch Vessel is moderate in size.  Intervention  No interventions have been documented.   STRESS TESTS  MYOCARDIAL PERFUSION IMAGING 08/27/2018  Narrative  Nuclear stress EF: 64%.  There was no ST segment deviation noted during stress.  Defect 1:  There is a small defect of mild severity present in the basal inferoseptal and mid inferoseptal location.  This is a low risk study.  The left ventricular ejection fraction is normal (55-65%).  Mild fixed inferoseptal defect, likely artifact  No evidence of ischemia.  Lexiscan nuclear stress test with normal EF and wall motion. Fixed small inferoseptal defect at rest, slightly improved with stress, may be artifact given size and location. Otherwise no perfusion abnormalities. Low risk study.   ECHOCARDIOGRAM  ECHOCARDIOGRAM COMPLETE 09/28/2021  Narrative ECHOCARDIOGRAM REPORT    Patient Name:   Tiandre Elliston Date of Exam: 09/28/2021 Medical Rec #:  [id number]     Height:       71.0 in Accession #:    [id number]    Weight:       261.8 lb Date of  Birth:  1941/01/06     BSA:          2.364 m Patient Age:    81 years      BP:           138/79 mmHg Patient Gender: M             HR:           57 bpm. Exam Location:  Inpatient  Procedure: 2D Echo, 3D Echo, Cardiac Doppler and Color Doppler  Indications:    R94.31 Abnormal EKG. Complete heart block I44.2  History:        Patient has prior history of Echocardiogram examinations, most recent 09/04/2018. Abnormal ECG, Arrythmias:Heart block, Signs/Symptoms:Syncope; Risk Factors:Dyslipidemia and Hypertension.  Sonographer:    Sheralyn Boatman RDCS Referring Phys: Gerre Scull JR   Sonographer Comments: Image acquisition challenging due to patient body habitus. IMPRESSIONS   1. Left ventricular ejection fraction by 3D volume is 59 %. The left ventricle has normal function. The left ventricle has no regional wall motion abnormalities. There is mild concentric left ventricular hypertrophy. Left ventricular diastolic parameters are consistent with Grade I diastolic dysfunction (impaired relaxation). 2. Right ventricular systolic function is normal. The right ventricular size is normal. Tricuspid regurgitation signal is inadequate for assessing PA pressure. 3. The mitral valve is normal in structure. Trivial mitral valve regurgitation. No evidence of mitral stenosis. 4. The aortic valve is tricuspid. Aortic valve regurgitation is not visualized. Aortic valve sclerosis is present, with no evidence of aortic valve stenosis. 5. There is mild dilatation of the ascending aorta, measuring 38 mm. 6. The inferior vena cava is normal in size with greater than 50% respiratory variability, suggesting right atrial pressure of 3 mmHg.  FINDINGS Left Ventricle: Left ventricular ejection fraction by 3D volume is 59 %. The left ventricle has normal function. The left ventricle has no regional wall motion abnormalities. The left ventricular internal cavity size was normal in size. There is mild concentric left  ventricular hypertrophy. Left ventricular diastolic parameters are consistent with Grade I diastolic dysfunction (impaired relaxation).  Right Ventricle: The right ventricular size is normal. No increase in right ventricular wall thickness. Right ventricular systolic function is normal. Tricuspid regurgitation signal is inadequate for assessing PA pressure.  Left Atrium: Left atrial size was normal in size.  Right Atrium: Right atrial size was normal in size.  Pericardium: There is no evidence of pericardial effusion. Presence of epicardial fat layer.  Mitral Valve: The mitral valve is normal in structure. Trivial mitral valve regurgitation. No evidence of mitral valve stenosis.  Tricuspid Valve: The tricuspid valve is normal in structure. Tricuspid valve regurgitation is  trivial. No evidence of tricuspid stenosis.  Aortic Valve: The aortic valve is tricuspid. Aortic valve regurgitation is not visualized. Aortic valve sclerosis is present, with no evidence of aortic valve stenosis.  Pulmonic Valve: The pulmonic valve was normal in structure. Pulmonic valve regurgitation is not visualized. No evidence of pulmonic stenosis.  Aorta: The aortic root is normal in size and structure. There is mild dilatation of the ascending aorta, measuring 38 mm.  Venous: The inferior vena cava is normal in size with greater than 50% respiratory variability, suggesting right atrial pressure of 3 mmHg.  IAS/Shunts: No atrial level shunt detected by color flow Doppler.   LEFT VENTRICLE PLAX 2D LVIDd:         4.50 cm         Diastology LVIDs:         2.90 cm         LV e' medial:    6.96 cm/s LV PW:         1.30 cm         LV E/e' medial:  9.1 LV IVS:        1.20 cm         LV e' lateral:   5.87 cm/s LVOT diam:     2.50 cm         LV E/e' lateral: 10.8 LV SV:         132 LV SV Index:   56 LVOT Area:     4.91 cm        3D Volume EF LV 3D EF:    Left ventricul LV Volumes (MOD)                             ar LV vol d, MOD    106.0 ml                   ejection A2C:                                        fraction LV vol d, MOD    89.5 ml                    by 3D A4C:                                        volume is LV vol s, MOD    37.2 ml                    59 %. A2C: LV vol s, MOD    36.6 ml A4C:                           3D Volume EF: LV SV MOD A2C:   68.8 ml       3D EF:        59 % LV SV MOD A4C:   89.5 ml       LV EDV:       197 ml LV SV MOD BP:    62.4 ml       LV ESV:       80 ml LV SV:  117 ml  RIGHT VENTRICLE             IVC RV S prime:     15.30 cm/s  IVC diam: 2.30 cm TAPSE (M-mode): 2.3 cm  LEFT ATRIUM           Index        RIGHT ATRIUM           Index LA diam:      3.10 cm 1.31 cm/m   RA Area:     21.00 cm LA Vol (A2C): 46.6 ml 19.71 ml/m  RA Volume:   60.10 ml  25.42 ml/m LA Vol (A4C): 31.5 ml 13.32 ml/m AORTIC VALVE LVOT Vmax:   128.00 cm/s LVOT Vmean:  84.100 cm/s LVOT VTI:    0.268 m  AORTA Ao Root diam: 3.50 cm Ao Asc diam:  3.80 cm  MITRAL VALVE MV Area (PHT): 3.75 cm     SHUNTS MV Decel Time: 203 msec     Systemic VTI:  0.27 m MV E velocity: 63.15 cm/s   Systemic Diam: 2.50 cm MV A velocity: 113.00 cm/s MV E/A ratio:  0.56  Kardie Tobb DO Electronically signed by Thomasene Ripple DO Signature Date/Time: 09/28/2021/12:31:52 PM    Final    MONITORS  CARDIAC EVENT MONITOR 10/20/2021  Narrative Dylan Zimmerman, DOB [birth year], MRN [id number]  EVENT MONITOR REPORT:   Patient was monitored from 09/26/2021 to 10/07/2021. Indication:                    Dizziness Ordering physician:  Garwin Brothers, MD Referring physician:  Garwin Brothers, MD   Baseline rhythm: Sinus  Minimum heart rate: 46 BPM.  Maximal heart rate 98 BPM.  Atrial arrhythmia: None significant  Ventricular arrhythmia: None significant  Conduction abnormality: Patient was noted to have high degree AV block suggesting complete heart block and was informed about this and  sought treatment in the emergency room  Symptoms: Dizziness and giddiness   Conclusion: High degree AV block for which patient was sent to the emergency room for evaluation and treatment.  Interpreting  cardiologist: Garwin Brothers, MD Date: 10/24/2021 3:54 PM               Recent Labs: No results found for requested labs within last 365 days.  Recent Lipid Panel    Component Value Date/Time   CHOL 135 09/27/2021 0153   CHOL 163 01/14/2020 0920   TRIG 123 09/27/2021 0153   HDL 36 (L) 09/27/2021 0153   HDL 37 (L) 01/14/2020 0920   CHOLHDL 3.8 09/27/2021 0153   VLDL 25 09/27/2021 0153   LDLCALC 74 09/27/2021 0153   LDLCALC 74 01/14/2020 0920    Physical Exam:    VS:  BP (!) 140/80 (BP Location: Left Arm, Patient Position: Sitting, Cuff Size: Normal)   Pulse 60   Ht 5\' 11"  (1.803 m)   Wt 270 lb (122.5 kg)   SpO2 96%   BMI 37.66 kg/m     Wt Readings from Last 3 Encounters:  10/23/22 270 lb (122.5 kg)  01/04/22 264 lb 9.6 oz (120 kg)  10/20/21 265 lb 1.3 oz (120.2 kg)     GEN:  Well nourished, well developed in no acute distress HEENT: Normal NECK: No JVD; No carotid bruits LYMPHATICS: No lymphadenopathy CARDIAC: RRR, no murmurs, rubs, gallops RESPIRATORY:  Clear to auscultation without rales, wheezing or rhonchi  ABDOMEN: Soft, non-tender, non-distended MUSCULOSKELETAL:  No edema; No deformity  SKIN:  Warm and dry NEUROLOGIC:  Alert and oriented x 3 PSYCHIATRIC:  Normal affect    Signed, Norman Herrlich, MD  10/23/2022 12:43 PM    [facility]

## 2022-10-23 ENCOUNTER — Encounter: Payer: Self-pay | Admitting: Cardiology

## 2022-10-23 ENCOUNTER — Ambulatory Visit: Payer: Medicare PPO | Attending: Cardiology | Admitting: Cardiology

## 2022-10-23 VITALS — BP 140/80 | HR 60 | Ht 71.0 in | Wt 270.0 lb

## 2022-10-23 DIAGNOSIS — Z7901 Long term (current) use of anticoagulants: Secondary | ICD-10-CM | POA: Diagnosis not present

## 2022-10-23 DIAGNOSIS — Z950 Presence of cardiac pacemaker: Secondary | ICD-10-CM | POA: Diagnosis not present

## 2022-10-23 DIAGNOSIS — I480 Paroxysmal atrial fibrillation: Secondary | ICD-10-CM

## 2022-10-23 DIAGNOSIS — I10 Essential (primary) hypertension: Secondary | ICD-10-CM

## 2022-10-23 DIAGNOSIS — I25118 Atherosclerotic heart disease of native coronary artery with other forms of angina pectoris: Secondary | ICD-10-CM

## 2022-10-23 DIAGNOSIS — I442 Atrioventricular block, complete: Secondary | ICD-10-CM

## 2022-10-23 DIAGNOSIS — E7800 Pure hypercholesterolemia, unspecified: Secondary | ICD-10-CM | POA: Diagnosis not present

## 2022-10-23 DIAGNOSIS — I493 Ventricular premature depolarization: Secondary | ICD-10-CM

## 2022-10-23 MED ORDER — APIXABAN 2.5 MG PO TABS: 2.5000 mg | ORAL_TABLET | Freq: Two times a day (BID) | ORAL | 3 refills | Status: AC

## 2022-10-23 NOTE — Patient Instructions (Signed)
Medication Instructions:  Your physician has recommended you make the following change in your medication:   START: Eliquis 2.5 mg twice daily (Stop this medication if you have recurrent bleeding).  *If you need a refill on your cardiac medications before your next appointment, please call your pharmacy*   Lab Work: None If you have labs (blood work) drawn today and your tests are completely normal, you will receive your results only by: MyChart Message (if you have MyChart) OR A paper copy in the mail If you have any lab test that is abnormal or we need to change your treatment, we will call you to review the results.   Testing/Procedures: None   Follow-Up: At [facility], you and your health needs are our priority.  As part of our continuing mission to provide you with exceptional heart care, we have created designated Provider Care Teams.  These Care Teams include your primary Cardiologist (physician) and Advanced Practice Providers (APPs -  Physician Assistants and Nurse Practitioners) who all work together to provide you with the care you need, when you need it.  We recommend signing up for the patient portal called "MyChart".  Sign up information is provided on this After Visit Summary.  MyChart is used to connect with patients for Virtual Visits (Telemedicine).  Patients are able to view lab/test results, encounter notes, upcoming appointments, etc.  Non-urgent messages can be sent to your provider as well.   To learn more about what you can do with MyChart, go to [email or web address].    Your next appointment:   6 months  Provider:   Norman Herrlich, MD    Other Instructions None

## 2022-10-25 ENCOUNTER — Telehealth: Payer: Self-pay | Admitting: Cardiology

## 2022-10-25 NOTE — Telephone Encounter (Signed)
Contacted the patient in regards to [facility] referral from Dr. Dulce Sellar. At this time, the patient is doing a low dose Eliquis trial to see if he will be able to tolerate short term AC. Patients granddaughter is getting married the end of August and wishes to be contacted early September for possible scheduling.    Georgie Chard NP-C Structural Heart Team  Pager: [phone] Phone: [phone] '

## 2022-12-04 NOTE — Telephone Encounter (Signed)
Called the patient for an update. He stated he is taking his Eliquis and is having no issues. He declined a Watchman consult at this time. Will send MyChart message with direct phone number should he change his mind. He was grateful for call and agreed with plan.

## 2022-12-05 ENCOUNTER — Other Ambulatory Visit: Payer: Self-pay | Admitting: Cardiology

## 2022-12-05 DIAGNOSIS — E7800 Pure hypercholesterolemia, unspecified: Secondary | ICD-10-CM | POA: Diagnosis not present

## 2022-12-05 DIAGNOSIS — Z79899 Other long term (current) drug therapy: Secondary | ICD-10-CM | POA: Diagnosis not present

## 2022-12-05 DIAGNOSIS — I10 Essential (primary) hypertension: Secondary | ICD-10-CM | POA: Diagnosis not present

## 2022-12-05 DIAGNOSIS — E119 Type 2 diabetes mellitus without complications: Secondary | ICD-10-CM | POA: Diagnosis not present

## 2022-12-05 DIAGNOSIS — I442 Atrioventricular block, complete: Secondary | ICD-10-CM | POA: Diagnosis not present

## 2022-12-05 DIAGNOSIS — D6869 Other thrombophilia: Secondary | ICD-10-CM | POA: Diagnosis not present

## 2022-12-05 DIAGNOSIS — Z23 Encounter for immunization: Secondary | ICD-10-CM | POA: Diagnosis not present

## 2022-12-05 DIAGNOSIS — I700 Atherosclerosis of aorta: Secondary | ICD-10-CM | POA: Diagnosis not present

## 2022-12-05 DIAGNOSIS — R55 Syncope and collapse: Secondary | ICD-10-CM

## 2022-12-05 DIAGNOSIS — I480 Paroxysmal atrial fibrillation: Secondary | ICD-10-CM | POA: Diagnosis not present

## 2023-01-05 ENCOUNTER — Ambulatory Visit (INDEPENDENT_AMBULATORY_CARE_PROVIDER_SITE_OTHER): Payer: Medicare PPO

## 2023-01-05 DIAGNOSIS — I442 Atrioventricular block, complete: Secondary | ICD-10-CM | POA: Diagnosis not present

## 2023-01-05 LAB — CUP PACEART REMOTE DEVICE CHECK
Battery Remaining Longevity: 98 mo
Battery Remaining Percentage: 89 %
Battery Voltage: 3.01 V
Brady Statistic AP VP Percent: 3.9 %
Brady Statistic AP VS Percent: 86 %
Brady Statistic AS VP Percent: 1 %
Brady Statistic AS VS Percent: 4.2 %
Brady Statistic RA Percent Paced: 78 %
Brady Statistic RV Percent Paced: 4.3 %
Date Time Interrogation Session: 20241011023015
Implantable Lead Connection Status: 753985
Implantable Lead Connection Status: 753985
Implantable Lead Implant Date: 20230706
Implantable Lead Implant Date: 20230706
Implantable Lead Location: 753859
Implantable Lead Location: 753860
Implantable Pulse Generator Implant Date: 20230706
Lead Channel Impedance Value: 390 Ohm
Lead Channel Impedance Value: 430 Ohm
Lead Channel Pacing Threshold Amplitude: 0.5 V
Lead Channel Pacing Threshold Amplitude: 0.75 V
Lead Channel Pacing Threshold Pulse Width: 0.4 ms
Lead Channel Pacing Threshold Pulse Width: 0.5 ms
Lead Channel Sensing Intrinsic Amplitude: 2.4 mV
Lead Channel Sensing Intrinsic Amplitude: 5.1 mV
Lead Channel Setting Pacing Amplitude: 1 V
Lead Channel Setting Pacing Amplitude: 2 V
Lead Channel Setting Pacing Pulse Width: 0.5 ms
Lead Channel Setting Sensing Sensitivity: 2 mV
Pulse Gen Model: 2272
Pulse Gen Serial Number: 8073798

## 2023-01-15 NOTE — Progress Notes (Signed)
Remote pacemaker transmission.   

## 2023-01-29 DIAGNOSIS — L821 Other seborrheic keratosis: Secondary | ICD-10-CM | POA: Diagnosis not present

## 2023-01-29 DIAGNOSIS — Z85828 Personal history of other malignant neoplasm of skin: Secondary | ICD-10-CM | POA: Diagnosis not present

## 2023-01-29 DIAGNOSIS — D0439 Carcinoma in situ of skin of other parts of face: Secondary | ICD-10-CM | POA: Diagnosis not present

## 2023-01-29 DIAGNOSIS — D225 Melanocytic nevi of trunk: Secondary | ICD-10-CM | POA: Diagnosis not present

## 2023-01-29 DIAGNOSIS — L570 Actinic keratosis: Secondary | ICD-10-CM | POA: Diagnosis not present

## 2023-02-05 ENCOUNTER — Other Ambulatory Visit: Payer: Self-pay | Admitting: Cardiology

## 2023-02-07 DIAGNOSIS — H539 Unspecified visual disturbance: Secondary | ICD-10-CM | POA: Diagnosis not present

## 2023-02-07 DIAGNOSIS — H31093 Other chorioretinal scars, bilateral: Secondary | ICD-10-CM | POA: Diagnosis not present

## 2023-02-26 DIAGNOSIS — Z7901 Long term (current) use of anticoagulants: Secondary | ICD-10-CM | POA: Diagnosis not present

## 2023-02-26 DIAGNOSIS — Z800 Family history of malignant neoplasm of digestive organs: Secondary | ICD-10-CM | POA: Diagnosis not present

## 2023-02-26 DIAGNOSIS — Z860101 Personal history of adenomatous and serrated colon polyps: Secondary | ICD-10-CM | POA: Diagnosis not present

## 2023-03-05 ENCOUNTER — Telehealth: Payer: Self-pay | Admitting: *Deleted

## 2023-03-05 ENCOUNTER — Telehealth: Payer: Self-pay

## 2023-03-05 DIAGNOSIS — R3912 Poor urinary stream: Secondary | ICD-10-CM | POA: Diagnosis not present

## 2023-03-05 DIAGNOSIS — N200 Calculus of kidney: Secondary | ICD-10-CM | POA: Diagnosis not present

## 2023-03-05 DIAGNOSIS — N401 Enlarged prostate with lower urinary tract symptoms: Secondary | ICD-10-CM | POA: Diagnosis not present

## 2023-03-05 NOTE — Telephone Encounter (Signed)
   Name: Dylan Zimmerman  DOB: April 29, 1940  MRN: [id number]  Primary Cardiologist: Norman Herrlich, MD  Preoperative team, please contact this patient and set up a phone call appointment for further preoperative risk assessment. Please obtain consent and complete medication review. Thank you for your help.  I confirm that guidance regarding antiplatelet and oral anticoagulation therapy has been completed and, if necessary, noted below.  Patient with diagnosis of afib on Eliquis for anticoagulation.   Procedure: colonoscopy Date of procedure: 03/14/23   CHA2DS2-VASc Score = 4   This indicates a 4.8% annual risk of stroke. The patient's score is based upon: CHF History: 0 HTN History: 1 Diabetes History: 0 Stroke History: 0 Vascular Disease History: 1 Age Score: 2 Gender Score: 0 CrCl 72 ml/min  Per office protocol, patient can hold Eliquis for 1-2 days prior to procedure. Please resume when safe to do so from a bleeding standpoint.   I also confirmed the patient [place] in the state [name]. As per [facility] [employer] telemedicine laws, the patient must reside in the state in which the provider is licensed.  Rip Harbour, NP 03/05/2023, 2:24 PM [facility]

## 2023-03-05 NOTE — Telephone Encounter (Signed)
   Pre-operative Risk Assessment    Patient Name: Dylan Zimmerman  DOB: Feb 20, 1941 MRN: [id number]      Request for Surgical Clearance    Procedure:   colonoscopy   Date of Surgery:  Clearance 03/14/23                                 Surgeon:   Surgeon's Group or Practice Name:  [facility] Phone number:  [phone] Fax number:  [phone]   Type of Clearance Requested:   - Pharmacy:  Hold Apixaban (Eliquis) 1 day before procedure    Type of Anesthesia:  Not Indicated   Additional requests/questions:    Merlene Laughter   03/05/2023, 8:14 AM

## 2023-03-05 NOTE — Telephone Encounter (Signed)
Patient with diagnosis of afib on Eliquis for anticoagulation.    Procedure: colonoscopy Date of procedure: 03/14/23   CHA2DS2-VASc Score = 4   This indicates a 4.8% annual risk of stroke. The patient's score is based upon: CHF History: 0 HTN History: 1 Diabetes History: 0 Stroke History: 0 Vascular Disease History: 1 Age Score: 2 Gender Score: 0      CrCl 72 ml/min  Per office protocol, patient can hold Eliquis for 1-2 days prior to procedure.    **This guidance is not considered finalized until pre-operative APP has relayed final recommendations.**

## 2023-03-05 NOTE — Telephone Encounter (Signed)
Patient scheduled 03/07/23

## 2023-03-05 NOTE — Telephone Encounter (Signed)
 Please advise holding Eliquis prior to colonoscopy on 12/18  Thank you!  DW

## 2023-03-05 NOTE — Telephone Encounter (Signed)
    Patient Consent for Virtual Visit         Dylan Zimmerman has provided verbal consent on 03/05/2023 for a virtual visit (video or telephone).   CONSENT FOR VIRTUAL VISIT FOR:  Dylan Zimmerman  By participating in this virtual visit I agree to the following:  I hereby voluntarily request, consent and authorize [facility] and its employed or contracted physicians, physician assistants, nurse practitioners or other licensed health care professionals (the Practitioner), to provide me with telemedicine health care services (the "Services") as deemed necessary by the treating Practitioner. I acknowledge and consent to receive the Services by the Practitioner via telemedicine. I understand that the telemedicine visit will involve communicating with the Practitioner through live audiovisual communication technology and the disclosure of certain medical information by electronic transmission. I acknowledge that I have been given the opportunity to request an in-person assessment or other available alternative prior to the telemedicine visit and am voluntarily participating in the telemedicine visit.  I understand that I have the right to withhold or withdraw my consent to the use of telemedicine in the course of my care at any time, without affecting my right to future care or treatment, and that the Practitioner or I may terminate the telemedicine visit at any time. I understand that I have the right to inspect all information obtained and/or recorded in the course of the telemedicine visit and may receive copies of available information for a reasonable fee.  I understand that some of the potential risks of receiving the Services via telemedicine include:  Delay or interruption in medical evaluation due to technological equipment failure or disruption; Information transmitted may not be sufficient (e.g. poor resolution of images) to allow for appropriate medical decision making by the  Practitioner; and/or  In rare instances, security protocols could fail, causing a breach of personal health information.  Furthermore, I acknowledge that it is my responsibility to provide information about my medical history, conditions and care that is complete and accurate to the best of my ability. I acknowledge that Practitioner's advice, recommendations, and/or decision may be based on factors not within their control, such as incomplete or inaccurate data provided by me or distortions of diagnostic images or specimens that may result from electronic transmissions. I understand that the practice of medicine is not an exact science and that Practitioner makes no warranties or guarantees regarding treatment outcomes. I acknowledge that a copy of this consent can be made available to me via my patient portal [facility] MyChart), or I can request a printed copy by calling the office of [facility].    I understand that my insurance will be billed for this visit.   I have read or had this consent read to me. I understand the contents of this consent, which adequately explains the benefits and risks of the Services being provided via telemedicine.  I have been provided ample opportunity to ask questions regarding this consent and the Services and have had my questions answered to my satisfaction. I give my informed consent for the services to be provided through the use of telemedicine in my medical care

## 2023-03-07 ENCOUNTER — Ambulatory Visit: Payer: Medicare PPO | Attending: Cardiology

## 2023-03-07 DIAGNOSIS — Z01818 Encounter for other preprocedural examination: Secondary | ICD-10-CM

## 2023-03-07 DIAGNOSIS — Z01810 Encounter for preprocedural cardiovascular examination: Secondary | ICD-10-CM

## 2023-03-07 NOTE — Progress Notes (Signed)
Virtual Visit via Telephone Note   Because of Dylan Zimmerman's co-morbid illnesses, he is at least at moderate risk for complications without adequate follow up.  This format is felt to be most appropriate for this patient at this time.  The patient did not have access to video technology/had technical difficulties with video requiring transitioning to audio format only (telephone).  All issues noted in this document were discussed and addressed.  No physical exam could be performed with this format.  Please refer to the patient's chart for his consent to telehealth for Dylan Zimmerman.  Evaluation Performed:  Preoperative cardiovascular risk assessment _____________   Date:  03/07/2023   Patient ID:  Dylan Zimmerman, DOB 13-Nov-1940, MRN [id number] Patient Location:  Home Provider location:   Office  Primary Care Provider:  Darrow Bussing, MD Primary Cardiologist:  Dylan Herrlich, MD  Chief Complaint / Patient Profile   82 y.o. y/o male with a h/o PAF, PPM in situ, HTN, HL, non obstructive CAD.with medical management for secondary prevention.  He is pending colonoscopy with Dylan Zimmerman, on 03/14/2023 and presents today for telephonic preoperative cardiovascular risk assessment.  History of Present Illness    Dylan Zimmerman is a 82 y.o. male who presents via audio/video conferencing for a telehealth visit today.  Pt was last seen in cardiology clinic on 10/23/2022 by Dylan Zimmerman.  At that time Dylan Zimmerman was doing well .  The patient is now pending procedure as outlined above. Since his last visit, he states he has had two episodes of atrial fib after overdoing it at home.  These were short lived. He continues to be active working in his yard and can do all ADL's.  He is medically compliant and has had to issues with bleeding on the Eliquis.   Past Medical History    Past Medical History:  Diagnosis Date   Back pain at L4-L5 level 12/07/2018   Cholelithiasis with chronic cholecystitis  04/20/2019   Chronic cholecystitis    Colorectal polyp detected on colonoscopy 01/01/2020   Elevated liver enzymes 04/20/2019   Essential hypertension 06/18/2018   Hyperlipidemia    Hyperlipidemia    Hypertension    Obesity 06/18/2018   Palpitations 06/18/2018   PVC's (premature ventricular contractions) 08/21/2018   Syncope and collapse    Past Surgical History:  Procedure Laterality Date   CATARACT EXTRACTION Bilateral    CHOLECYSTECTOMY N/A 04/21/2019   Procedure: LAPAROSCOPIC CHOLECYSTECTOMY WITH INTRAOPERATIVE CHOLANGIOGRAM;  Surgeon: Darnell Level, MD;  Location: [facility];  Service: General;  Laterality: N/A;   LEFT HEART CATH AND CORONARY ANGIOGRAPHY N/A 09/28/2021   Procedure: LEFT HEART CATH AND CORONARY ANGIOGRAPHY;  Surgeon: Marykay Lex, MD;  Location: [facility] INVASIVE CV LAB;  Service: Cardiovascular;  Laterality: N/A;   LITHOTRIPSY     LOOP RECORDER INSERTION N/A 10/04/2018   Procedure: LOOP RECORDER INSERTION;  Surgeon: Regan Lemming, MD;  Location: [facility] INVASIVE CV LAB;  Service: Cardiovascular;  Laterality: N/A;   PACEMAKER IMPLANT N/A 09/29/2021   Procedure: PACEMAKER IMPLANT;  Surgeon: Marinus Maw, MD;  Location: [facility] INVASIVE CV LAB;  Service: Cardiovascular;  Laterality: N/A;   TONSILLECTOMY      Allergies  Allergies  Allergen Reactions   Shellfish Allergy Anaphylaxis   Bactrim [Sulfamethoxazole-Trimethoprim] Nausea And Vomiting   Iodine     shellfish    Home Medications    Prior to Admission medications   Medication Sig Start Date End Date Taking? Authorizing Provider  amLODipine (  NORVASC) 5 MG tablet Take 5 mg by mouth every morning.    [provider]  apixaban (ELIQUIS) 2.5 MG TABS tablet Take 1 tablet (2.5 mg total) by mouth 2 (two) times daily. 10/23/22   Baldo Daub, MD  atorvastatin (LIPITOR) 10 MG tablet Take 10 mg by mouth every morning. 05/15/18   [provider]  diclofenac Sodium (VOLTAREN) 1 % GEL Apply 1  Application topically at bedtime as needed (back pain).    [provider]  dorzolamide-timolol (COSOPT) 22.3-6.8 MG/ML ophthalmic solution Place 1 drop into both eyes 2 (two) times daily.    [provider]  fluticasone (FLONASE) 50 MCG/ACT nasal spray Place 1 spray into both nostrils at bedtime as needed for allergies or rhinitis. 11/04/19   [provider]  hydrochlorothiazide (HYDRODIURIL) 25 MG tablet TAKE 1 TABLET (25 MG TOTAL) BY MOUTH DAILY. 12/05/22   Baldo Daub, MD  latanoprost (XALATAN) 0.005 % ophthalmic solution Place 1 drop into both eyes at bedtime.    [provider]  metoprolol succinate (TOPROL-XL) 50 MG 24 hr tablet TAKE 1 TABLET BY MOUTH EVERY DAY WITH OR IMMEDIATELY FOLLOWING A MEAL 02/05/23   Baldo Daub, MD  Multiple Vitamin (MULTIVITAMIN WITH MINERALS) TABS tablet Take 1 tablet by mouth every morning. Centrum Silver    [provider]  pseudoephedrine (SUDAFED) 30 MG tablet Take 30 mg by mouth 2 (two) times daily as needed (fluid in ears).    [provider]  sildenafil (REVATIO) 20 MG tablet Take 20 mg by mouth daily as needed (ED). 06/07/18   [provider]  zolpidem (AMBIEN) 10 MG tablet Take 10 mg by mouth at bedtime as needed for sleep.  05/15/18   [provider]    Physical Exam    Vital Signs:  Dylan Zimmerman does not have vital signs available for review today.  Given telephonic nature of communication, physical exam is limited. AAOx3. NAD. Normal affect.  Speech and respirations are unlabored.  Accessory Clinical Findings    None  Assessment & Plan    1.  Preoperative Cardiovascular Risk Assessment:  According to the Revised Cardiac Risk Index (RCRI), his Perioperative Risk of Major Cardiac Event is (%): 0.4  His Functional Capacity in METs is: 7.99 according to the Duke Activity Status Index (DASI).   Per office protocol, patient can hold Eliquis for 1-2 days prior to  procedure.    Therefore, based on ACC/AHA guidelines, patient would be at acceptable risk for the planned procedure without further cardiovascular testing. I will route this recommendation to the requesting party via Epic fax function.   The patient was advised that if he develops new symptoms prior to surgery to contact our office to arrange for a follow-up visit, and he verbalized understanding.   A copy of this note will be routed to requesting surgeon.  Time:   Today, I have spent 10 minutes with the patient with telehealth technology discussing medical history, symptoms, and management plan.     Joni Reining, NP  03/07/2023, 10:01 AM

## 2023-03-14 DIAGNOSIS — K648 Other hemorrhoids: Secondary | ICD-10-CM | POA: Diagnosis not present

## 2023-03-14 DIAGNOSIS — Z09 Encounter for follow-up examination after completed treatment for conditions other than malignant neoplasm: Secondary | ICD-10-CM | POA: Diagnosis not present

## 2023-03-14 DIAGNOSIS — Z800 Family history of malignant neoplasm of digestive organs: Secondary | ICD-10-CM | POA: Diagnosis not present

## 2023-03-14 DIAGNOSIS — D123 Benign neoplasm of transverse colon: Secondary | ICD-10-CM | POA: Diagnosis not present

## 2023-03-14 DIAGNOSIS — K5730 Diverticulosis of large intestine without perforation or abscess without bleeding: Secondary | ICD-10-CM | POA: Diagnosis not present

## 2023-03-14 DIAGNOSIS — D125 Benign neoplasm of sigmoid colon: Secondary | ICD-10-CM | POA: Diagnosis not present

## 2023-03-14 DIAGNOSIS — Z860101 Personal history of adenomatous and serrated colon polyps: Secondary | ICD-10-CM | POA: Diagnosis not present

## 2023-03-14 DIAGNOSIS — K644 Residual hemorrhoidal skin tags: Secondary | ICD-10-CM | POA: Diagnosis not present

## 2023-03-19 DIAGNOSIS — D125 Benign neoplasm of sigmoid colon: Secondary | ICD-10-CM | POA: Diagnosis not present

## 2023-03-19 DIAGNOSIS — D123 Benign neoplasm of transverse colon: Secondary | ICD-10-CM | POA: Diagnosis not present

## 2023-04-06 ENCOUNTER — Ambulatory Visit: Payer: Medicare PPO

## 2023-04-06 DIAGNOSIS — I442 Atrioventricular block, complete: Secondary | ICD-10-CM

## 2023-04-07 LAB — CUP PACEART REMOTE DEVICE CHECK
Battery Remaining Longevity: 97 mo
Battery Remaining Percentage: 86 %
Battery Voltage: 3.01 V
Brady Statistic AP VP Percent: 3.5 %
Brady Statistic AP VS Percent: 87 %
Brady Statistic AS VP Percent: 1 %
Brady Statistic AS VS Percent: 4.3 %
Brady Statistic RA Percent Paced: 80 %
Brady Statistic RV Percent Paced: 3.8 %
Date Time Interrogation Session: 20250110020015
Implantable Lead Connection Status: 753985
Implantable Lead Connection Status: 753985
Implantable Lead Implant Date: 20230706
Implantable Lead Implant Date: 20230706
Implantable Lead Location: 753859
Implantable Lead Location: 753860
Implantable Pulse Generator Implant Date: 20230706
Lead Channel Impedance Value: 390 Ohm
Lead Channel Impedance Value: 430 Ohm
Lead Channel Pacing Threshold Amplitude: 0.5 V
Lead Channel Pacing Threshold Amplitude: 0.875 V
Lead Channel Pacing Threshold Pulse Width: 0.4 ms
Lead Channel Pacing Threshold Pulse Width: 0.5 ms
Lead Channel Sensing Intrinsic Amplitude: 1.5 mV
Lead Channel Sensing Intrinsic Amplitude: 5.5 mV
Lead Channel Setting Pacing Amplitude: 1.125
Lead Channel Setting Pacing Amplitude: 2 V
Lead Channel Setting Pacing Pulse Width: 0.5 ms
Lead Channel Setting Sensing Sensitivity: 2 mV
Pulse Gen Model: 2272
Pulse Gen Serial Number: 8073798

## 2023-04-19 ENCOUNTER — Encounter: Payer: Self-pay | Admitting: Cardiology

## 2023-04-22 NOTE — Progress Notes (Unsigned)
Cardiology Office Note:    Date:  04/23/2023   ID:  Dylan Zimmerman, DOB 10/01/1940, MRN [id number]  PCP:  Darrow Bussing, MD  Cardiologist:  Norman Herrlich, MD    Referring MD: Darrow Bussing, MD    ASSESSMENT:    1. PAF (paroxysmal atrial fibrillation) (HCC)   2. Chronic anticoagulation   3. Complete AV block (HCC)   4. Pacemaker   5. Coronary artery disease of native artery of native heart with stable angina pectoris (HCC)   6. Essential hypertension   7. Pure hypercholesterolemia    PLAN:    In order of problems listed above:  He continues to have a small burden of recurrent atrial fibrillation from device interrogation little bit more than 1% has had 2 symptomatic episodes he is aware of especially with his Fitbit watch.  I asked him in the future to document this with the mobile Lourena Simmonds also With the infrequent clinical occurrence and small burden I would not start an antiarrhythmic drug he will continue his beta-blocker The difficult issue of stroke prophylaxis he does not tolerate full dose Eliquis with hematuria related to renal calculi he tolerates half dose but he does not quite fit criteria with weight and kidney function and he is not interested in Watchman device at this time An option is to use edoxaban there is literature that half dose is effective over the age of 32 and I asked him to consider it he will speak with his son who is a Marine scientist in [place] and let me know through MyChart if he would like to transition when he uses his Eliquis Normal pacemaker function he is 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  Concerns regarding  medicines are outlined above.  Orders Placed This Encounter  Procedures   EKG 12-Lead   No orders of the defined types were placed in this encounter.    History of Present Illness:    Dylan Zimmerman is a 83 y.o. male with a hx of paroxysmal atrial fibrillation with clinical recurrence and high risk for anticoagulation with history of GI bleeding and renal calculi block permanent pacemaker mild nonobstructive coronary artery disease hypertension and hyperlipidemia last seen 10/23/2022 and was placed on half dose anticoagulant.  And referred him to EP for consideration of Watchman device  Compliance with diet, lifestyle and medications: Yes  He tolerates his reduced dose anticoagulant without bleeding and does not want to pursue Watchman device He has minimal symptoms of atrial fibrillation and 2 occurrences off of his Fitbit watch did not document the mobile Kardia and 1% burden on device interrogation Not having edema shortness of breath chest pain or syncope  His wife is present participates in the evaluation decision making Past Medical History:  Diagnosis Date   Atrial fibrillation (HCC) 10/20/2021   Back pain at L4-L5 level 12/07/2018   Bilateral tinnitus 09/26/2021   Cardiac pacemaker in situ 10/20/2021   Cholelithiasis with chronic cholecystitis 04/20/2019   Chronic cholecystitis    Colorectal polyp detected on colonoscopy 01/01/2020   Complete heart block (HCC) 09/27/2021   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   amLODipine (NORVASC) 5 MG tablet Take 5 mg by mouth every morning.   apixaban (ELIQUIS) 2.5 MG TABS tablet Take 1 tablet (2.5 mg total) by mouth 2 (two) times daily.   atorvastatin (LIPITOR) 10 MG tablet Take 10 mg by mouth every morning.   diclofenac Sodium (VOLTAREN) 1 % GEL Apply 1 Application topically at bedtime as needed (back pain).   dorzolamide-timolol (COSOPT) 22.3-6.8 MG/ML ophthalmic solution Place 1 drop into both eyes 2 (two) times daily.   hydrochlorothiazide (HYDRODIURIL) 25 MG tablet TAKE 1 TABLET (25 MG TOTAL) BY MOUTH DAILY.   latanoprost (XALATAN) 0.005 % ophthalmic solution Place 1 drop into both eyes at bedtime.   metoprolol succinate (TOPROL-XL) 50 MG 24 hr tablet TAKE 1 TABLET BY MOUTH EVERY DAY WITH OR IMMEDIATELY FOLLOWING A MEAL   Multiple Vitamin (MULTIVITAMIN WITH MINERALS) TABS tablet Take 1 tablet by mouth every morning. Centrum Silver   pseudoephedrine (SUDAFED) 30 MG tablet Take 30 mg by mouth 2 (two) times daily as needed (fluid in ears).   sildenafil (REVATIO) 20 MG tablet Take 20 mg by mouth daily as needed (ED).   zolpidem (AMBIEN) 10 MG tablet Take 10 mg by mouth at bedtime as needed for sleep.       EKGs/Labs/Other Studies Reviewed:    The following studies were reviewed today:  Cardiac Studies & Procedures   CARDIAC CATHETERIZATION  CARDIAC CATHETERIZATION 09/28/2021  Narrative   Prox LAD lesion is 40% stenosed.   1st RPL lesion is 40% stenosed.   Prox RCA lesion is 20% stenosed.   The left ventricular systolic function is normal.   LV end diastolic pressure is normal.   There is no aortic valve stenosis.  SUMMARY Unusual coronary anatomy, but no significant lesions.  Nothing more than 40% proximal LAD and proximal PL 1. Large dominant RCA with a high bifurcation and very prominent PDA with 2 major prep PL  branches. Large left main gives rise to a major circumflex-OM1 with small side branches. There are 3 branches that come off-1 appears to be the LAD-ramus but there is a diagonal branch that comes off upstream of the circumflex on the left main. The middle branch appears to be the LAD proper while the more medial branch appears to be essentially a septal perforator trunk with a diagonal.  Neither vessel reaches the apex.  These are all relatively small vessels. Normal LVEDP -> 9 mmHg  RECOMMENDATIONS: Recommend proceeding with pacemaker placement in the morning.   Bryan Lemma, MD  Findings Coronary Findings Diagnostic  Dominance: Right  Left Main Vessel was injected. Vessel is normal in caliber and large. Vessel is angiographically normal.  Left Anterior Descending Vessel was injected. Vessel is normal in caliber. Vessel is angiographically normal. Prox LAD lesion is 40% stenosed.  First Diagonal Branch Vessel is small in size.  First Septal Branch Vessel is small in size.  Third Septal Branch Vessel is small in size.  Ramus Intermedius Vessel is moderate in size.  Left Circumflex Vessel  was injected. Vessel is normal in caliber. Vessel is angiographically normal.  First Obtuse Marginal Branch Vessel is small in size.  Left Posterior Atrioventricular Artery Vessel is small in size.  Right Coronary Artery Vessel was injected. Vessel is large. Vessel is angiographically normal. Prox RCA lesion is 20% stenosed.  Right Ventricular Branch Vessel is small in size.  Right Posterior Descending Artery Vessel is large in size.  Right Posterior Atrioventricular Artery Vessel is large in size.  First Right Posterolateral Branch 1st RPL lesion is 40% stenosed.  Second Right Posterolateral Branch Vessel is moderate in size.  Intervention  No interventions have been documented.   STRESS TESTS  MYOCARDIAL PERFUSION IMAGING 08/27/2018  Narrative  Nuclear stress  EF: 64%.  There was no ST segment deviation noted during stress.  Defect 1: There is a small defect of mild severity present in the basal inferoseptal and mid inferoseptal location.  This is a low risk study.  The left ventricular ejection fraction is normal (55-65%).  Mild fixed inferoseptal defect, likely artifact  No evidence of ischemia.  Lexiscan nuclear stress test with normal EF and wall motion. Fixed small inferoseptal defect at rest, slightly improved with stress, may be artifact given size and location. Otherwise no perfusion abnormalities. Low risk study.  ECHOCARDIOGRAM  ECHOCARDIOGRAM COMPLETE 09/28/2021  Narrative ECHOCARDIOGRAM REPORT    Patient Name:   Aidric Endicott Date of Exam: 09/28/2021 Medical Rec #:  [id number]     Height:       71.0 in Accession #:    [id number]    Weight:       261.8 lb Date of Birth:  [birth year]     BSA:          2.364 m Patient Age:    81 years      BP:           138/79 mmHg Patient Gender: M             HR:           57 bpm. Exam Location:  Inpatient  Procedure: 2D Echo, 3D Echo, Cardiac Doppler and Color Doppler  Indications:    R94.31 Abnormal EKG. Complete heart block I44.2  History:        Patient has prior history of Echocardiogram examinations, most recent 09/04/2018. Abnormal ECG, Arrythmias:Heart block, Signs/Symptoms:Syncope; Risk Factors:Dyslipidemia and Hypertension.  Sonographer:    Sheralyn Boatman RDCS Referring Phys: Gerre Scull JR   Sonographer Comments: Image acquisition challenging due to patient body habitus. IMPRESSIONS   1. Left ventricular ejection fraction by 3D volume is 59 %. The left ventricle has normal function. The left ventricle has no regional wall motion abnormalities. There is mild concentric left ventricular hypertrophy. Left ventricular diastolic parameters are consistent with Grade I diastolic dysfunction (impaired relaxation). 2. Right ventricular systolic function is normal. The right  ventricular size is normal. Tricuspid regurgitation signal is inadequate for assessing PA pressure. 3. The mitral valve is normal in structure. Trivial mitral valve regurgitation. No evidence of mitral stenosis. 4. The aortic valve is tricuspid. Aortic valve regurgitation is not visualized. Aortic valve sclerosis is present, with no evidence of aortic valve stenosis. 5. There is mild dilatation of the ascending aorta, measuring 38 mm. 6. The inferior vena cava is normal in size with greater than 50% respiratory variability, suggesting right atrial pressure of 3 mmHg.  FINDINGS Left Ventricle: Left ventricular ejection fraction by 3D volume is 59 %. The left ventricle has normal function. The  left ventricle has no regional wall motion abnormalities. The left ventricular internal cavity size was normal in size. There is mild concentric left ventricular hypertrophy. Left ventricular diastolic parameters are consistent with Grade I diastolic dysfunction (impaired relaxation).  Right Ventricle: The right ventricular size is normal. No increase in right ventricular wall thickness. Right ventricular systolic function is normal. Tricuspid regurgitation signal is inadequate for assessing PA pressure.  Left Atrium: Left atrial size was normal in size.  Right Atrium: Right atrial size was normal in size.  Pericardium: There is no evidence of pericardial effusion. Presence of epicardial fat layer.  Mitral Valve: The mitral valve is normal in structure. Trivial mitral valve regurgitation. No evidence of mitral valve stenosis.  Tricuspid Valve: The tricuspid valve is normal in structure. Tricuspid valve regurgitation is trivial. No evidence of tricuspid stenosis.  Aortic Valve: The aortic valve is tricuspid. Aortic valve regurgitation is not visualized. Aortic valve sclerosis is present, with no evidence of aortic valve stenosis.  Pulmonic Valve: The pulmonic valve was normal in structure. Pulmonic valve  regurgitation is not visualized. No evidence of pulmonic stenosis.  Aorta: The aortic root is normal in size and structure. There is mild dilatation of the ascending aorta, measuring 38 mm.  Venous: The inferior vena cava is normal in size with greater than 50% respiratory variability, suggesting right atrial pressure of 3 mmHg.  IAS/Shunts: No atrial level shunt detected by color flow Doppler.   LEFT VENTRICLE PLAX 2D LVIDd:         4.50 cm         Diastology LVIDs:         2.90 cm         LV e' medial:    6.96 cm/s LV PW:         1.30 cm         LV E/e' medial:  9.1 LV IVS:        1.20 cm         LV e' lateral:   5.87 cm/s LVOT diam:     2.50 cm         LV E/e' lateral: 10.8 LV SV:         132 LV SV Index:   56 LVOT Area:     4.91 cm        3D Volume EF LV 3D EF:    Left ventricul LV Volumes (MOD)                            ar LV vol d, MOD    106.0 ml                   ejection A2C:                                        fraction LV vol d, MOD    89.5 ml                    by 3D A4C:                                        volume is LV vol s, MOD    37.2 ml  59 %. A2C: LV vol s, MOD    36.6 ml A4C:                           3D Volume EF: LV SV MOD A2C:   68.8 ml       3D EF:        59 % LV SV MOD A4C:   89.5 ml       LV EDV:       197 ml LV SV MOD BP:    62.4 ml       LV ESV:       80 ml LV SV:        117 ml  RIGHT VENTRICLE             IVC RV S prime:     15.30 cm/s  IVC diam: 2.30 cm TAPSE (M-mode): 2.3 cm  LEFT ATRIUM           Index        RIGHT ATRIUM           Index LA diam:      3.10 cm 1.31 cm/m   RA Area:     21.00 cm LA Vol (A2C): 46.6 ml 19.71 ml/m  RA Volume:   60.10 ml  25.42 ml/m LA Vol (A4C): 31.5 ml 13.32 ml/m AORTIC VALVE LVOT Vmax:   128.00 cm/s LVOT Vmean:  84.100 cm/s LVOT VTI:    0.268 m  AORTA Ao Root diam: 3.50 cm Ao Asc diam:  3.80 cm  MITRAL VALVE MV Area (PHT): 3.75 cm     SHUNTS MV Decel Time: 203 msec      Systemic VTI:  0.27 m MV E velocity: 63.15 cm/s   Systemic Diam: 2.50 cm MV A velocity: 113.00 cm/s MV E/A ratio:  0.56  Kardie Tobb DO Electronically signed by Thomasene Ripple DO Signature Date/Time: 09/28/2021/12:31:52 PM    Final   MONITORS  CARDIAC EVENT MONITOR 10/20/2021  Narrative Helaine Chess, DOB April 30, 1940, MRN [id number]  EVENT MONITOR REPORT:   Patient was monitored from 09/26/2021 to 10/07/2021. Indication:                    Dizziness Ordering physician:  Garwin Brothers, MD Referring physician:  Garwin Brothers, MD   Baseline rhythm: Sinus  Minimum heart rate: 46 BPM.  Maximal heart rate 98 BPM.  Atrial arrhythmia: None significant  Ventricular arrhythmia: None significant  Conduction abnormality: Patient was noted to have high degree AV block suggesting complete heart block and was informed about this and sought treatment in the emergency room  Symptoms: Dizziness and giddiness   Conclusion: High degree AV block for which patient was sent to the emergency room for evaluation and treatment.  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 rate has decreased BY  45 BPM Confirmed by Norman Herrlich ([id number]) on 04/23/2023 11:38:34 AM   Recent Labs: No results found for requested labs  within last 365 days.  Recent Lipid Panel    Component Value Date/Time   CHOL 135 09/27/2021 0153   CHOL 163 01/14/2020 0920   TRIG 123 09/27/2021 0153   HDL 36 (L) 09/27/2021 0153   HDL 37 (L) 01/14/2020 0920    CHOLHDL 3.8 09/27/2021 0153   VLDL 25 09/27/2021 0153   LDLCALC 74 09/27/2021 0153   LDLCALC 74 01/14/2020 0920    Physical Exam:    VS:  BP 132/72   Pulse 60   Ht 5\' 11"  (1.803 m)   Wt 267 lb 3.2 oz (121.2 kg)   SpO2 95%   BMI 37.27 kg/m     Wt Readings from Last 3 Encounters:  04/23/23 267 lb 3.2 oz (121.2 kg)  10/23/22 270 lb (122.5 kg)  01/04/22 264 lb 9.6 oz (120 kg)     GEN:  Well nourished, well developed in no acute distress HEENT: Normal NECK: No JVD; No carotid bruits LYMPHATICS: No lymphadenopathy CARDIAC: RRR, no murmurs, rubs, gallops RESPIRATORY:  Clear to auscultation without rales, wheezing or rhonchi  ABDOMEN: Soft, non-tender, non-distended MUSCULOSKELETAL:  No edema; No deformity  SKIN: Warm and dry NEUROLOGIC:  Alert and oriented x 3 PSYCHIATRIC:  Normal affect    Signed, Norman Herrlich, MD  04/23/2023 11:39 AM     [facility]

## 2023-04-23 ENCOUNTER — Ambulatory Visit: Payer: Medicare PPO | Attending: Cardiology | Admitting: Cardiology

## 2023-04-23 ENCOUNTER — Encounter: Payer: Self-pay | Admitting: Cardiology

## 2023-04-23 VITALS — BP 132/72 | HR 60 | Ht 71.0 in | Wt 267.2 lb

## 2023-04-23 DIAGNOSIS — E7800 Pure hypercholesterolemia, unspecified: Secondary | ICD-10-CM | POA: Diagnosis not present

## 2023-04-23 DIAGNOSIS — Z950 Presence of cardiac pacemaker: Secondary | ICD-10-CM | POA: Diagnosis not present

## 2023-04-23 DIAGNOSIS — I480 Paroxysmal atrial fibrillation: Secondary | ICD-10-CM | POA: Diagnosis not present

## 2023-04-23 DIAGNOSIS — I25118 Atherosclerotic heart disease of native coronary artery with other forms of angina pectoris: Secondary | ICD-10-CM

## 2023-04-23 DIAGNOSIS — Z7901 Long term (current) use of anticoagulants: Secondary | ICD-10-CM

## 2023-04-23 DIAGNOSIS — I442 Atrioventricular block, complete: Secondary | ICD-10-CM

## 2023-04-23 DIAGNOSIS — I10 Essential (primary) hypertension: Secondary | ICD-10-CM

## 2023-04-23 NOTE — Patient Instructions (Signed)
Medication Instructions:  Your physician recommends that you continue on your current medications as directed. Please refer to the Current Medication list given to you today.  *If you need a refill on your cardiac medications before your next appointment, please call your pharmacy*   Lab Work: None If you have labs (blood work) drawn today and your tests are completely normal, you will receive your results only by: MyChart Message (if you have MyChart) OR A paper copy in the mail If you have any lab test that is abnormal or we need to change your treatment, we will call you to review the results.   Testing/Procedures: None   Follow-Up: At [facility], you and your health needs are our priority.  As part of our continuing mission to provide you with exceptional heart care, we have created designated Provider Care Teams.  These Care Teams include your primary Cardiologist (physician) and Advanced Practice Providers (APPs -  Physician Assistants and Nurse Practitioners) who all work together to provide you with the care you need, when you need it.  We recommend signing up for the patient portal called "MyChart".  Sign up information is provided on this After Visit Summary.  MyChart is used to connect with patients for Virtual Visits (Telemedicine).  Patients are able to view lab/test results, encounter notes, upcoming appointments, etc.  Non-urgent messages can be sent to your provider as well.   To learn more about what you can do with MyChart, go to [email or web address].    Your next appointment:   6 month(s)  Provider:   Norman Herrlich, MD    Other Instructions None

## 2023-05-11 NOTE — Progress Notes (Signed)
Remote pacemaker transmission.

## 2023-05-11 NOTE — Addendum Note (Signed)
Addended by: Elease Etienne A on: 05/11/2023 10:19 AM   Modules accepted: Orders

## 2023-05-28 ENCOUNTER — Other Ambulatory Visit: Payer: Self-pay | Admitting: Cardiology

## 2023-05-28 DIAGNOSIS — R55 Syncope and collapse: Secondary | ICD-10-CM

## 2023-05-29 ENCOUNTER — Other Ambulatory Visit: Payer: Self-pay | Admitting: Cardiology

## 2023-06-04 DIAGNOSIS — Z79899 Other long term (current) drug therapy: Secondary | ICD-10-CM | POA: Diagnosis not present

## 2023-06-04 DIAGNOSIS — Z0001 Encounter for general adult medical examination with abnormal findings: Secondary | ICD-10-CM | POA: Diagnosis not present

## 2023-06-04 DIAGNOSIS — I442 Atrioventricular block, complete: Secondary | ICD-10-CM | POA: Diagnosis not present

## 2023-06-04 DIAGNOSIS — D6869 Other thrombophilia: Secondary | ICD-10-CM | POA: Diagnosis not present

## 2023-06-04 DIAGNOSIS — Z125 Encounter for screening for malignant neoplasm of prostate: Secondary | ICD-10-CM | POA: Diagnosis not present

## 2023-06-04 DIAGNOSIS — I10 Essential (primary) hypertension: Secondary | ICD-10-CM | POA: Diagnosis not present

## 2023-06-04 DIAGNOSIS — G4700 Insomnia, unspecified: Secondary | ICD-10-CM | POA: Diagnosis not present

## 2023-06-04 DIAGNOSIS — E7800 Pure hypercholesterolemia, unspecified: Secondary | ICD-10-CM | POA: Diagnosis not present

## 2023-06-04 DIAGNOSIS — E119 Type 2 diabetes mellitus without complications: Secondary | ICD-10-CM | POA: Diagnosis not present

## 2023-06-04 DIAGNOSIS — I480 Paroxysmal atrial fibrillation: Secondary | ICD-10-CM | POA: Diagnosis not present

## 2023-06-06 DIAGNOSIS — Z961 Presence of intraocular lens: Secondary | ICD-10-CM | POA: Diagnosis not present

## 2023-06-06 DIAGNOSIS — H53021 Refractive amblyopia, right eye: Secondary | ICD-10-CM | POA: Diagnosis not present

## 2023-06-06 DIAGNOSIS — H40053 Ocular hypertension, bilateral: Secondary | ICD-10-CM | POA: Diagnosis not present

## 2023-07-06 ENCOUNTER — Ambulatory Visit (INDEPENDENT_AMBULATORY_CARE_PROVIDER_SITE_OTHER): Payer: Medicare PPO

## 2023-07-06 DIAGNOSIS — I442 Atrioventricular block, complete: Secondary | ICD-10-CM

## 2023-07-07 LAB — CUP PACEART REMOTE DEVICE CHECK
Battery Remaining Longevity: 94 mo
Battery Remaining Percentage: 84 %
Battery Voltage: 3.01 V
Brady Statistic AP VP Percent: 7.9 %
Brady Statistic AP VS Percent: 83 %
Brady Statistic AS VP Percent: 1 %
Brady Statistic AS VS Percent: 3.8 %
Brady Statistic RA Percent Paced: 82 %
Brady Statistic RV Percent Paced: 8.4 %
Date Time Interrogation Session: 20250411094607
Implantable Lead Connection Status: 753985
Implantable Lead Connection Status: 753985
Implantable Lead Implant Date: 20230706
Implantable Lead Implant Date: 20230706
Implantable Lead Location: 753859
Implantable Lead Location: 753860
Implantable Pulse Generator Implant Date: 20230706
Lead Channel Impedance Value: 390 Ohm
Lead Channel Impedance Value: 430 Ohm
Lead Channel Pacing Threshold Amplitude: 0.5 V
Lead Channel Pacing Threshold Amplitude: 0.875 V
Lead Channel Pacing Threshold Pulse Width: 0.4 ms
Lead Channel Pacing Threshold Pulse Width: 0.5 ms
Lead Channel Sensing Intrinsic Amplitude: 2.5 mV
Lead Channel Sensing Intrinsic Amplitude: 4 mV
Lead Channel Setting Pacing Amplitude: 1.125
Lead Channel Setting Pacing Amplitude: 2 V
Lead Channel Setting Pacing Pulse Width: 0.5 ms
Lead Channel Setting Sensing Sensitivity: 2 mV
Pulse Gen Model: 2272
Pulse Gen Serial Number: 8073798

## 2023-08-06 DIAGNOSIS — L570 Actinic keratosis: Secondary | ICD-10-CM | POA: Diagnosis not present

## 2023-08-06 DIAGNOSIS — L820 Inflamed seborrheic keratosis: Secondary | ICD-10-CM | POA: Diagnosis not present

## 2023-08-06 DIAGNOSIS — L821 Other seborrheic keratosis: Secondary | ICD-10-CM | POA: Diagnosis not present

## 2023-08-06 DIAGNOSIS — D0439 Carcinoma in situ of skin of other parts of face: Secondary | ICD-10-CM | POA: Diagnosis not present

## 2023-08-06 DIAGNOSIS — D2372 Other benign neoplasm of skin of left lower limb, including hip: Secondary | ICD-10-CM | POA: Diagnosis not present

## 2023-08-06 DIAGNOSIS — D1801 Hemangioma of skin and subcutaneous tissue: Secondary | ICD-10-CM | POA: Diagnosis not present

## 2023-08-06 DIAGNOSIS — Z85828 Personal history of other malignant neoplasm of skin: Secondary | ICD-10-CM | POA: Diagnosis not present

## 2023-08-13 NOTE — Addendum Note (Signed)
 Addended by: Lott Rouleau A on: 08/13/2023 07:30 AM   Modules accepted: Orders

## 2023-08-13 NOTE — Progress Notes (Signed)
 Remote pacemaker transmission.

## 2023-10-05 ENCOUNTER — Ambulatory Visit: Payer: Medicare PPO

## 2023-10-05 DIAGNOSIS — I442 Atrioventricular block, complete: Secondary | ICD-10-CM

## 2023-10-06 LAB — CUP PACEART REMOTE DEVICE CHECK
Battery Remaining Longevity: 91 mo
Battery Remaining Percentage: 81 %
Battery Voltage: 2.99 V
Brady Statistic AP VP Percent: 10 %
Brady Statistic AP VS Percent: 81 %
Brady Statistic AS VP Percent: 1 %
Brady Statistic AS VS Percent: 3.5 %
Brady Statistic RA Percent Paced: 83 %
Brady Statistic RV Percent Paced: 11 %
Date Time Interrogation Session: 20250711020014
Implantable Lead Connection Status: 753985
Implantable Lead Connection Status: 753985
Implantable Lead Implant Date: 20230706
Implantable Lead Implant Date: 20230706
Implantable Lead Location: 753859
Implantable Lead Location: 753860
Implantable Pulse Generator Implant Date: 20230706
Lead Channel Impedance Value: 390 Ohm
Lead Channel Impedance Value: 430 Ohm
Lead Channel Pacing Threshold Amplitude: 0.5 V
Lead Channel Pacing Threshold Amplitude: 0.75 V
Lead Channel Pacing Threshold Pulse Width: 0.4 ms
Lead Channel Pacing Threshold Pulse Width: 0.5 ms
Lead Channel Sensing Intrinsic Amplitude: 1.4 mV
Lead Channel Sensing Intrinsic Amplitude: 12 mV
Lead Channel Setting Pacing Amplitude: 1 V
Lead Channel Setting Pacing Amplitude: 2 V
Lead Channel Setting Pacing Pulse Width: 0.5 ms
Lead Channel Setting Sensing Sensitivity: 2 mV
Pulse Gen Model: 2272
Pulse Gen Serial Number: 8073798

## 2023-10-08 ENCOUNTER — Ambulatory Visit: Payer: Self-pay | Admitting: Cardiology

## 2023-10-22 DIAGNOSIS — N401 Enlarged prostate with lower urinary tract symptoms: Secondary | ICD-10-CM | POA: Diagnosis not present

## 2023-10-22 DIAGNOSIS — R3912 Poor urinary stream: Secondary | ICD-10-CM | POA: Diagnosis not present

## 2023-10-22 DIAGNOSIS — N200 Calculus of kidney: Secondary | ICD-10-CM | POA: Diagnosis not present

## 2023-10-23 ENCOUNTER — Ambulatory Visit: Attending: Cardiology | Admitting: Cardiology

## 2023-10-23 ENCOUNTER — Encounter: Payer: Self-pay | Admitting: Cardiology

## 2023-10-23 ENCOUNTER — Ambulatory Visit: Admitting: Cardiology

## 2023-10-23 VITALS — BP 158/84 | HR 60 | Ht 71.0 in | Wt 274.6 lb

## 2023-10-23 DIAGNOSIS — E7800 Pure hypercholesterolemia, unspecified: Secondary | ICD-10-CM | POA: Diagnosis not present

## 2023-10-23 DIAGNOSIS — Z950 Presence of cardiac pacemaker: Secondary | ICD-10-CM | POA: Diagnosis not present

## 2023-10-23 DIAGNOSIS — I10 Essential (primary) hypertension: Secondary | ICD-10-CM | POA: Diagnosis not present

## 2023-10-23 DIAGNOSIS — E669 Obesity, unspecified: Secondary | ICD-10-CM | POA: Diagnosis not present

## 2023-10-23 DIAGNOSIS — I480 Paroxysmal atrial fibrillation: Secondary | ICD-10-CM | POA: Insufficient documentation

## 2023-10-23 NOTE — Patient Instructions (Signed)
Medication Instructions:  Your physician recommends that you continue on your current medications as directed. Please refer to the Current Medication list given to you today.  *If you need a refill on your cardiac medications before your next appointment, please call your pharmacy*   Lab Work: None ordered If you have labs (blood work) drawn today and your tests are completely normal, you will receive your results only by: MyChart Message (if you have MyChart) OR A paper copy in the mail If you have any lab test that is abnormal or we need to change your treatment, we will call you to review the results.   Testing/Procedures: None ordered   Follow-Up: At [facility], you and your health needs are our priority.  As part of our continuing mission to provide you with exceptional heart care, we have created designated Provider Care Teams.  These Care Teams include your primary Cardiologist (physician) and Advanced Practice Providers (APPs -  Physician Assistants and Nurse Practitioners) who all work together to provide you with the care you need, when you need it.  We recommend signing up for the patient portal called "MyChart".  Sign up information is provided on this After Visit Summary.  MyChart is used to connect with patients for Virtual Visits (Telemedicine).  Patients are able to view lab/test results, encounter notes, upcoming appointments, etc.  Non-urgent messages can be sent to your provider as well.   To learn more about what you can do with MyChart, go to [email or web address].    Your next appointment:   9 month(s)  The format for your next appointment:   In Person  Provider:   Norman Herrlich, MD    Other Instructions none  Important Information About Sugar

## 2023-10-23 NOTE — Progress Notes (Signed)
 Cardiology Office Note:    Date:  10/23/2023   ID:  Dylan Zimmerman, DOB Jul 09, 1940, MRN [id number]  PCP:  Regino Slater, MD  Cardiologist:  Jennifer JONELLE Crape, MD   Referring MD: Regino Slater, MD    ASSESSMENT:    1. PAF (paroxysmal atrial fibrillation) (HCC)   2. Essential hypertension   3. Cardiac pacemaker in situ   4. Pure hypercholesterolemia   5. Obesity (BMI 35.0-39.9 without comorbidity)    PLAN:    In order of problems listed above:  Coronary artery disease: Secondary prevention stressed with the patient.  Importance of compliance with diet medication stressed any vocalized understanding.  He was advised to ambulate to the best of his ability. Paroxysmal atrial fibrillation:I discussed with the patient atrial fibrillation, disease process. Management and therapy including rate and rhythm control, anticoagulation benefits and potential risks were discussed extensively with the patient. Patient had multiple questions which were answered to patient's satisfaction.  Please review impressions about this diagnosis that I mentioned below and the anticoagulation dosage. Essential hypertension: Blood pressure stable and diet was emphasized.  Lifestyle modification urged.  His blood pressures are better at home. Diabetes mellitus and obesity and mixed dyslipidemia: On lipid-lowering medications followed by primary care.  Lipids reviewed diet emphasized. Pacemaker in situ: Managed by electrophysiology. Patient will be seen in follow-up appointment in 6 months with Dr. Monetta or earlier if the patient has any concerns.    Medication Adjustments/Labs and Tests Ordered: Current medicines are reviewed at length with the patient today.  Concerns regarding medicines are outlined above.  No orders of the defined types were placed in this encounter.  No orders of the defined types were placed in this encounter.    No chief complaint on file.    History of Present Illness:    Dylan Zimmerman is a 83 y.o. male.  Patient has past medical history of paroxysmal atrial fibrillation, sick sinus syndrome post permanent pacemaker, essential hypertension and dyslipidemia.  He denies any problems at this time and takes care of activities of daily living.  He has nonobstructive coronary artery disease.  At the time of my evaluation, the patient is alert awake oriented and in no distress.  He is on low-dose Eliquis  because of significant hematuria on high-dose Eliquis .  He has been educated about Watchman device.  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   Elevated liver enzymes 04/20/2019   Essential hypertension 06/18/2018   Hyperlipidemia    Hyperlipidemia    Hypertension    Insomnia 09/26/2021   Near syncope 09/26/2021   Obesity 06/18/2018   Palpitations 06/18/2018   Prediabetes 09/26/2021   Pure hypercholesterolemia 09/26/2021   PVC's (premature ventricular contractions) 08/21/2018   Sciatica, right side 09/26/2021   Syncope and collapse     Past Surgical History:  Procedure Laterality Date   CATARACT EXTRACTION Bilateral    CHOLECYSTECTOMY N/A 04/21/2019   Procedure: LAPAROSCOPIC CHOLECYSTECTOMY WITH INTRAOPERATIVE CHOLANGIOGRAM;  Surgeon: Eletha Boas, MD;  Location:  SURGERY CENTER;  Service: General;  Laterality: N/A;   LEFT HEART CATH AND CORONARY ANGIOGRAPHY N/A 09/28/2021   Procedure: LEFT HEART CATH AND CORONARY ANGIOGRAPHY;  Surgeon: Anner Alm ORN,  MD;  Location: [facility];  Service: Cardiovascular;  Laterality: N/A;   LITHOTRIPSY     LOOP RECORDER INSERTION N/A 10/04/2018   Procedure: LOOP RECORDER INSERTION;  Surgeon:  Inocencio Soyla Lunger, MD;  Location: [facility];  Service: Cardiovascular;  Laterality: N/A;   PACEMAKER IMPLANT N/A 09/29/2021   Procedure: PACEMAKER IMPLANT;  Surgeon: Waddell Danelle ORN, MD;  Location: [facility];  Service: Cardiovascular;  Laterality: N/A;   TONSILLECTOMY      Current Medications: Current Meds  Medication Sig   alfuzosin (UROXATRAL) 10 MG 24 hr tablet Take 10 mg by mouth at bedtime.   amLODipine  (NORVASC ) 5 MG tablet Take 5 mg by mouth every morning.   apixaban  (ELIQUIS ) 2.5 MG TABS tablet Take 1 tablet (2.5 mg total) by mouth 2 (two) times daily. (Patient taking differently: Take 2.5 mg by mouth daily.)   atorvastatin  (LIPITOR) 10 MG tablet Take 10 mg by mouth every morning.   diclofenac Sodium (VOLTAREN) 1 % GEL Apply 1 Application topically at bedtime as needed (back pain).   dorzolamide-timolol (COSOPT) 22.3-6.8 MG/ML ophthalmic solution Place 1 drop into both eyes 2 (two) times daily.   hydrochlorothiazide  (HYDRODIURIL ) 25 MG tablet Take 1 tablet (25 mg total) by mouth daily.   latanoprost (XALATAN) 0.005 % ophthalmic solution Place 1 drop into both eyes at bedtime.   metoprolol  succinate (TOPROL -XL) 50 MG 24 hr tablet TAKE 1 TABLET BY MOUTH EVERY DAY WITH OR IMMEDIATELY FOLLOWING A MEAL   Multiple Vitamin (MULTIVITAMIN WITH MINERALS) TABS tablet Take 1 tablet by mouth every morning. Centrum Silver   pseudoephedrine (SUDAFED) 30 MG tablet Take 30 mg by mouth 2 (two) times daily as needed (fluid in ears).   sildenafil (REVATIO) 20 MG tablet Take 20 mg by mouth daily as needed (ED).   zolpidem  (AMBIEN ) 10 MG tablet Take 10 mg by mouth at bedtime as needed for sleep.      Allergies:   Shellfish allergy, Bactrim [sulfamethoxazole-trimethoprim], and Iodine   Social History   Socioeconomic History   Marital status: Married    Spouse name: Not on file   Number of children: Not on file   Years of education: Not on file   Highest education level: Not on  file  Occupational History   Not on file  Tobacco Use   Smoking status: Never   Smokeless tobacco: Never  Vaping Use   Vaping status: Never Used  Substance and Sexual Activity   Alcohol use: Never   Drug use: Never   Sexual activity: Yes  Other Topics Concern   Not on file  Social History Narrative   Not on file   Social Drivers of Health   Financial Resource Strain: Not on file  Food Insecurity: Not on file  Transportation Needs: Not on file  Physical Activity: Not on file  Stress: Not on file  Social Connections: Not on file     Family History: The patient's family history includes Breast cancer in his mother; Cancer in his father and mother; Heart attack in his maternal grandfather and paternal grandmother; Heart disease in his paternal grandfather and paternal grandmother.  ROS:   Please see the history of present illness.    All other systems reviewed and are negative.  EKGs/Labs/Other Studies Reviewed:    The following studies were reviewed today: I discussed my findings with the patient at length   Recent Labs: No results found for requested labs within last 365 days.  Recent Lipid Panel    Component Value Date/Time   CHOL 135 09/27/2021 0153   CHOL 163 01/14/2020 0920   TRIG 123 09/27/2021 0153   HDL 36 (L) 09/27/2021 0153   HDL 37 (L) 01/14/2020 0920   CHOLHDL 3.8 09/27/2021 0153   VLDL 25 09/27/2021 0153   LDLCALC 74 09/27/2021 0153   LDLCALC 74 01/14/2020 0920    Physical Exam:    VS:  BP (!) 158/84   Pulse 60   Ht 5' 11 (1.803 m)   Wt 274 lb 9.6 oz (124.6 kg)   SpO2 96%   BMI 38.30 kg/m     Wt Readings from Last 3 Encounters:  10/23/23 274 lb 9.6 oz (124.6 kg)  04/23/23 267 lb 3.2 oz (121.2 kg)  10/23/22 270 lb (122.5 kg)     GEN: Patient is in no acute distress HEENT: Normal NECK: No JVD; No carotid bruits LYMPHATICS: No lymphadenopathy CARDIAC: Hear sounds regular, 2/6 systolic murmur at the apex. RESPIRATORY:  Clear to  auscultation without rales, wheezing or rhonchi  ABDOMEN: Soft, non-tender, non-distended MUSCULOSKELETAL:  No edema; No deformity  SKIN: Warm and dry NEUROLOGIC:  Alert and oriented x 3 PSYCHIATRIC:  Normal affect   Signed, Jennifer JONELLE Crape, MD  10/23/2023 4:41 PM    [facility]

## 2023-11-17 DIAGNOSIS — R399 Unspecified symptoms and signs involving the genitourinary system: Secondary | ICD-10-CM | POA: Diagnosis not present

## 2023-11-30 ENCOUNTER — Other Ambulatory Visit: Payer: Self-pay | Admitting: Cardiology

## 2023-11-30 DIAGNOSIS — R55 Syncope and collapse: Secondary | ICD-10-CM

## 2023-12-04 ENCOUNTER — Other Ambulatory Visit: Payer: Self-pay | Admitting: Cardiology

## 2023-12-10 DIAGNOSIS — I442 Atrioventricular block, complete: Secondary | ICD-10-CM | POA: Diagnosis not present

## 2023-12-10 DIAGNOSIS — E119 Type 2 diabetes mellitus without complications: Secondary | ICD-10-CM | POA: Diagnosis not present

## 2023-12-10 DIAGNOSIS — Z79899 Other long term (current) drug therapy: Secondary | ICD-10-CM | POA: Diagnosis not present

## 2023-12-10 DIAGNOSIS — R3121 Asymptomatic microscopic hematuria: Secondary | ICD-10-CM | POA: Diagnosis not present

## 2023-12-10 DIAGNOSIS — Z23 Encounter for immunization: Secondary | ICD-10-CM | POA: Diagnosis not present

## 2023-12-10 DIAGNOSIS — I480 Paroxysmal atrial fibrillation: Secondary | ICD-10-CM | POA: Diagnosis not present

## 2023-12-13 DIAGNOSIS — Z961 Presence of intraocular lens: Secondary | ICD-10-CM | POA: Diagnosis not present

## 2023-12-13 DIAGNOSIS — H40053 Ocular hypertension, bilateral: Secondary | ICD-10-CM | POA: Diagnosis not present

## 2023-12-13 DIAGNOSIS — H53021 Refractive amblyopia, right eye: Secondary | ICD-10-CM | POA: Diagnosis not present

## 2023-12-24 DIAGNOSIS — Z79899 Other long term (current) drug therapy: Secondary | ICD-10-CM | POA: Diagnosis not present

## 2024-01-03 NOTE — Progress Notes (Signed)
 Remote PPM Transmission

## 2024-01-04 ENCOUNTER — Ambulatory Visit: Payer: Medicare PPO

## 2024-01-04 DIAGNOSIS — I480 Paroxysmal atrial fibrillation: Secondary | ICD-10-CM | POA: Diagnosis not present

## 2024-01-07 LAB — CUP PACEART REMOTE DEVICE CHECK
Battery Remaining Longevity: 86 mo
Battery Remaining Percentage: 78 %
Battery Voltage: 2.99 V
Brady Statistic AP VP Percent: 19 %
Brady Statistic AP VS Percent: 72 %
Brady Statistic AS VP Percent: 1.3 %
Brady Statistic AS VS Percent: 3.1 %
Brady Statistic RA Percent Paced: 84 %
Brady Statistic RV Percent Paced: 20 %
Date Time Interrogation Session: 20251010170806
Implantable Lead Connection Status: 753985
Implantable Lead Connection Status: 753985
Implantable Lead Implant Date: 20230706
Implantable Lead Implant Date: 20230706
Implantable Lead Location: 753859
Implantable Lead Location: 753860
Implantable Pulse Generator Implant Date: 20230706
Lead Channel Impedance Value: 360 Ohm
Lead Channel Impedance Value: 410 Ohm
Lead Channel Pacing Threshold Amplitude: 0.5 V
Lead Channel Pacing Threshold Amplitude: 0.75 V
Lead Channel Pacing Threshold Pulse Width: 0.4 ms
Lead Channel Pacing Threshold Pulse Width: 0.5 ms
Lead Channel Sensing Intrinsic Amplitude: 1.4 mV
Lead Channel Sensing Intrinsic Amplitude: 6.1 mV
Lead Channel Setting Pacing Amplitude: 1 V
Lead Channel Setting Pacing Amplitude: 2 V
Lead Channel Setting Pacing Pulse Width: 0.5 ms
Lead Channel Setting Sensing Sensitivity: 2 mV
Pulse Gen Model: 2272
Pulse Gen Serial Number: 8073798

## 2024-01-08 NOTE — Progress Notes (Signed)
 Remote PPM Transmission

## 2024-01-13 DIAGNOSIS — R399 Unspecified symptoms and signs involving the genitourinary system: Secondary | ICD-10-CM | POA: Diagnosis not present

## 2024-01-13 DIAGNOSIS — N390 Urinary tract infection, site not specified: Secondary | ICD-10-CM | POA: Diagnosis not present

## 2024-01-22 ENCOUNTER — Ambulatory Visit: Payer: Self-pay | Admitting: Cardiology

## 2024-02-05 DIAGNOSIS — H539 Unspecified visual disturbance: Secondary | ICD-10-CM | POA: Diagnosis not present

## 2024-02-05 DIAGNOSIS — H31093 Other chorioretinal scars, bilateral: Secondary | ICD-10-CM | POA: Diagnosis not present

## 2024-02-05 DIAGNOSIS — E113292 Type 2 diabetes mellitus with mild nonproliferative diabetic retinopathy without macular edema, left eye: Secondary | ICD-10-CM | POA: Diagnosis not present

## 2024-02-05 DIAGNOSIS — E119 Type 2 diabetes mellitus without complications: Secondary | ICD-10-CM | POA: Diagnosis not present

## 2024-02-11 DIAGNOSIS — L821 Other seborrheic keratosis: Secondary | ICD-10-CM | POA: Diagnosis not present

## 2024-02-11 DIAGNOSIS — D225 Melanocytic nevi of trunk: Secondary | ICD-10-CM | POA: Diagnosis not present

## 2024-02-11 DIAGNOSIS — L820 Inflamed seborrheic keratosis: Secondary | ICD-10-CM | POA: Diagnosis not present

## 2024-02-11 DIAGNOSIS — L570 Actinic keratosis: Secondary | ICD-10-CM | POA: Diagnosis not present

## 2024-02-11 DIAGNOSIS — Z85828 Personal history of other malignant neoplasm of skin: Secondary | ICD-10-CM | POA: Diagnosis not present

## 2024-04-04 ENCOUNTER — Ambulatory Visit

## 2024-04-04 DIAGNOSIS — I442 Atrioventricular block, complete: Secondary | ICD-10-CM | POA: Diagnosis not present

## 2024-04-07 LAB — CUP PACEART REMOTE DEVICE CHECK
Battery Remaining Longevity: 84 mo
Battery Remaining Percentage: 76 %
Battery Voltage: 2.99 V
Brady Statistic AP VP Percent: 26 %
Brady Statistic AP VS Percent: 65 %
Brady Statistic AS VP Percent: 1.6 %
Brady Statistic AS VS Percent: 2.8 %
Brady Statistic RA Percent Paced: 85 %
Brady Statistic RV Percent Paced: 28 %
Date Time Interrogation Session: 20260109024552
Implantable Lead Connection Status: 753985
Implantable Lead Connection Status: 753985
Implantable Lead Implant Date: 20230706
Implantable Lead Implant Date: 20230706
Implantable Lead Location: 753859
Implantable Lead Location: 753860
Implantable Pulse Generator Implant Date: 20230706
Lead Channel Impedance Value: 390 Ohm
Lead Channel Impedance Value: 410 Ohm
Lead Channel Pacing Threshold Amplitude: 0.5 V
Lead Channel Pacing Threshold Amplitude: 0.75 V
Lead Channel Pacing Threshold Pulse Width: 0.4 ms
Lead Channel Pacing Threshold Pulse Width: 0.5 ms
Lead Channel Sensing Intrinsic Amplitude: 1.2 mV
Lead Channel Sensing Intrinsic Amplitude: 6.9 mV
Lead Channel Setting Pacing Amplitude: 1 V
Lead Channel Setting Pacing Amplitude: 2 V
Lead Channel Setting Pacing Pulse Width: 0.5 ms
Lead Channel Setting Sensing Sensitivity: 2 mV
Pulse Gen Model: 2272
Pulse Gen Serial Number: 8073798

## 2024-04-08 ENCOUNTER — Ambulatory Visit: Payer: Self-pay | Admitting: Cardiology

## 2024-04-08 NOTE — Progress Notes (Signed)
 Remote PPM Transmission

## 2024-07-04 ENCOUNTER — Encounter

## 2024-10-03 ENCOUNTER — Encounter

## 2025-01-02 ENCOUNTER — Encounter

## 2025-04-03 ENCOUNTER — Encounter

## 2025-07-03 ENCOUNTER — Encounter
